# Patient Record
Sex: Female | Born: 1945
Health system: Southern US, Community
[De-identification: ages and names within clinical notes are randomized; demographics above are authoritative.]

## PROBLEM LIST (undated history)

## (undated) DIAGNOSIS — I10 Essential (primary) hypertension: Secondary | ICD-10-CM

## (undated) DIAGNOSIS — E119 Type 2 diabetes mellitus without complications: Secondary | ICD-10-CM

## (undated) DIAGNOSIS — N289 Disorder of kidney and ureter, unspecified: Secondary | ICD-10-CM

## (undated) DIAGNOSIS — D649 Anemia, unspecified: Secondary | ICD-10-CM

## (undated) DIAGNOSIS — N184 Chronic kidney disease, stage 4 (severe): Secondary | ICD-10-CM

## (undated) DIAGNOSIS — R7303 Prediabetes: Secondary | ICD-10-CM

## (undated) HISTORY — PX: WRIST SURGERY: SHX841

---

## 2005-11-19 ENCOUNTER — Emergency Department (HOSPITAL_COMMUNITY): Admission: EM | Admit: 2005-11-19 | Discharge: 2005-11-19 | Payer: Self-pay | Admitting: *Deleted

## 2007-02-16 ENCOUNTER — Emergency Department (HOSPITAL_COMMUNITY): Admission: EM | Admit: 2007-02-16 | Discharge: 2007-02-16 | Payer: Self-pay | Admitting: Emergency Medicine

## 2012-03-04 ENCOUNTER — Encounter (HOSPITAL_COMMUNITY): Payer: Self-pay

## 2012-03-04 ENCOUNTER — Emergency Department (INDEPENDENT_AMBULATORY_CARE_PROVIDER_SITE_OTHER)
Admission: EM | Admit: 2012-03-04 | Discharge: 2012-03-04 | Disposition: A | Discharge status: Home / Self Care | Payer: Self-pay | Source: Home / Self Care | Attending: Emergency Medicine | Admitting: Emergency Medicine

## 2012-03-04 DIAGNOSIS — R739 Hyperglycemia, unspecified: Secondary | ICD-10-CM

## 2012-03-04 DIAGNOSIS — R7309 Other abnormal glucose: Secondary | ICD-10-CM

## 2012-03-04 HISTORY — DX: Prediabetes: R73.03

## 2012-03-04 LAB — CBC
HCT: 41.7 % (ref 36.0–46.0)
Hemoglobin: 14.8 g/dL (ref 12.0–15.0)
MCH: 29 pg (ref 26.0–34.0)
MCHC: 35.5 g/dL (ref 30.0–36.0)
MCV: 81.6 fL (ref 78.0–100.0)
Platelets: 223 10*3/uL (ref 150–400)
RBC: 5.11 MIL/uL (ref 3.87–5.11)
RDW: 12.1 % (ref 11.5–15.5)
WBC: 3.2 10*3/uL — ABNORMAL LOW (ref 4.0–10.5)

## 2012-03-04 LAB — POCT I-STAT, CHEM 8
BUN: 15 mg/dL (ref 6–23)
Calcium, Ion: 1.21 mmol/L (ref 1.12–1.32)
Chloride: 98 mEq/L (ref 96–112)
Creatinine, Ser: 1 mg/dL (ref 0.50–1.10)
Glucose, Bld: 325 mg/dL — ABNORMAL HIGH (ref 70–99)
HCT: 46 % (ref 36.0–46.0)
Hemoglobin: 15.6 g/dL — ABNORMAL HIGH (ref 12.0–15.0)
Potassium: 4 mEq/L (ref 3.5–5.1)
Sodium: 131 mEq/L — ABNORMAL LOW (ref 135–145)
TCO2: 23 mmol/L (ref 0–100)

## 2012-03-04 LAB — DIFFERENTIAL
Basophils Absolute: 0 10*3/uL (ref 0.0–0.1)
Basophils Relative: 1 % (ref 0–1)
Eosinophils Absolute: 0 10*3/uL (ref 0.0–0.7)
Eosinophils Relative: 0 % (ref 0–5)
Lymphocytes Relative: 37 % (ref 12–46)
Lymphs Abs: 1.2 10*3/uL (ref 0.7–4.0)
Monocytes Absolute: 0.2 10*3/uL (ref 0.1–1.0)
Monocytes Relative: 5 % (ref 3–12)
Neutro Abs: 1.8 10*3/uL (ref 1.7–7.7)
Neutrophils Relative %: 57 % (ref 43–77)

## 2012-03-04 MED ORDER — ORALYTE PO SOLN: 2000.0000 L | Freq: Once | ORAL | Status: DC

## 2012-03-04 MED ORDER — METFORMIN HCL 500 MG PO TABS: 500.0000 mg | ORAL_TABLET | Freq: Two times a day (BID) | ORAL | Status: DC

## 2012-03-04 NOTE — ED Notes (Signed)
Pt states she has been sick since last Wed.  Reports sx started with head congestion, cough, fever,  and headache.  Over the weekend she experienced diarrhea which she has treated with the BRAT diet.  Continues to feel fatigued, weak, chest and throat hurts with coughing.  Denies vomiting, states drinking plenty of fluids.

## 2012-03-04 NOTE — ED Provider Notes (Signed)
Have examined and interviewed patient- with NP student. Symptomatic treatment and structured oral rehydration recommendations. Follow-up with PCP recommended as we discussed possibly current hyperglycemia needs more long-term management as she has been off oral antihyperglycemics medicines per provider guidance.Patient is stable and agreed to discussed this further with Dr.Bland.  Rosana Hoes, MD 03/04/12 [phone]

## 2012-03-04 NOTE — ED Provider Notes (Signed)
History     CSN: [id number]  Arrival date & time 03/04/12  1155   First MD Initiated Contact with Patient 03/04/12 1256      Chief Complaint  Patient presents with  . URI  . Diarrhea    (Consider location/radiation/quality/duration/timing/severity/associated sxs/prior treatment) Patient is a 66 y.o. female presenting with diarrhea. The history is provided by the patient.  Diarrhea The primary symptoms include fatigue, abdominal pain and diarrhea. Primary symptoms do not include nausea, vomiting, melena or hematochezia. Episode onset: 5-7 days ago. Onset quality: improved but returned this am.  The fatigue began 2 days ago. The fatigue has been unchanged since its onset.  The abdominal pain began more than 2 days ago. Progression: intermittent. The abdominal pain is generalized. The abdominal pain does not radiate (generalized abdominal tenderness ).  The illness is also significant for chills. The illness does not include constipation.    Past Medical History  Diagnosis Date  . Pre-diabetes     Past Surgical History  Procedure Date  . Wrist surgery     No family history on file.  History  Substance Use Topics  . Smoking status: Never Smoker   . Smokeless tobacco: Not on file  . Alcohol Use: No    OB History    Grav Para Term Preterm Abortions TAB SAB Ect Mult Living                  Review of Systems  Constitutional: Positive for chills and fatigue.       Reports tactile fever  Respiratory: Positive for cough. Negative for wheezing.        Reports hx of productive cough x 1,     Gastrointestinal: Positive for abdominal pain and diarrhea. Negative for nausea, vomiting, constipation, blood in stool, melena and hematochezia.       Loose stools less than 10/day,  Restarted this am, reports tried [employer] that improved symptoms for one day,   Musculoskeletal:       Reports hx of weakness to extremities     Allergies  Review of patient's allergies indicates  no known allergies.  Home Medications   Current Outpatient Rx  Name Route Sig Dispense Refill  . METFORMIN HCL 500 MG PO TABS Oral Take 1 tablet (500 mg total) by mouth 2 (two) times daily with a meal. 60 tablet 0  . ORALYTE PO SOLN Oral Take 2,000 L by mouth once. 4 Bottle 0    BP 116/77  Pulse 100  Temp(Src) 99 F (37.2 C) (Oral)  Resp 14  SpO2 96%  Physical Exam  Constitutional: She is oriented to person, place, and time. She appears well-developed and well-nourished. No distress.  Pulmonary/Chest: No respiratory distress. She has no wheezes. She has no rales.  Abdominal: She exhibits no distension and no mass. There is tenderness. There is no rebound and no guarding.  Lymphadenopathy:    She has no cervical adenopathy.  Neurological: She is alert and oriented to person, place, and time.  Skin: Skin is warm and dry.  Psychiatric: She has a normal mood and affect.    ED Course  Procedures (including critical care time)  Labs Reviewed  CBC - Abnormal; Notable for the following:    WBC 3.2 (*)    All other components within normal limits  POCT I-STAT, CHEM 8 - Abnormal; Notable for the following:    Sodium 131 (*)    Glucose, Bld 325 (*)    Hemoglobin 15.6 (*)  All other components within normal limits  DIFFERENTIAL   No results found.   1. Hyperglycemia       MDM  66yo female presents to [facility] for diarrhea less than 10/day over the past 2 days that improved with BRAT diet she had researched on the computer, diarrhea restarted yesterday, deep cough  And productive x 1 patient is diet controlled diabetic, has used Diabetic Tussin OTC and Vitamin C without relief,  Labs drawn BMP, CBC results reviewed, patient instructed to follow up with PCP concerning diabetic control , restarting Metformin, diet instructions given,checking blood glucose levels,  hydration protocol reviewed,patient verbalized understanding of tx plan       Theadore Nan,  Student-NP 03/04/12 [place], [place] 03/04/12 1605

## 2012-03-04 NOTE — Discharge Instructions (Signed)
As discussed within the next 12 hours try to ingest anywhere from 2-4 L of fluid and start with metformin. It is unlikely that your sugar is as elevated as it is triggered only by his current viral syndrome I would encourage you to continue metformin until you see your primary care Dr. to monitor your sugars more closely or 2 in fact modify your treatment regimen. He also had several minutes of discussion about dietary modifications that should be done within the next 2-3 days.  If abdominal pain, fevers or worsening symptoms despite treatment should go to the emergency department for further evaluation    Hyperglycemia Hyperglycemia occurs when the glucose (sugar) in your blood is too high. Hyperglycemia can happen for many reasons, but it most often happens to people who do not know they have diabetes or are not managing their diabetes properly.  CAUSES  Whether you have diabetes or not, there are other causes of hyperglycemia. Hyperglycemia can occur when you have diabetes, but it can also occur in other situations that you might not be as aware of, such as: Diabetes  If you have diabetes and are having problems controlling your blood glucose, hyperglycemia could occur because of some of the following reasons:   Not following your meal plan.   Not taking your diabetes medications or not taking it properly.   Exercising less or doing less activity than you normally do.   Being sick.  Pre-diabetes  This cannot be ignored. Before people develop Type 2 diabetes, they almost always have "pre-diabetes." This is when your blood glucose levels are higher than normal, but not yet high enough to be diagnosed as diabetes. Research has shown that some long-term damage to the body, especially the heart and circulatory system, may already be occurring during pre-diabetes. If you take action to manage your blood glucose when you have pre-diabetes, you may delay or prevent Type 2 diabetes from  developing.  Stress  If you have diabetes, you may be "diet" controlled or on oral medications or insulin to control your diabetes. However, you may find that your blood glucose is higher than usual in the hospital whether you have diabetes or not. This is often referred to as "stress hyperglycemia." Stress can elevate your blood glucose. This happens because of hormones put out by the body during times of stress. If stress has been the cause of your high blood glucose, it can be followed regularly by your caregiver. That way he/she can make sure your hyperglycemia does not continue to get worse or progress to diabetes.  Steroids  Steroids are medications that act on the infection fighting system (immune system) to block inflammation or infection. One side effect can be a rise in blood glucose. Most people can produce enough extra insulin to allow for this rise, but for those who cannot, steroids make blood glucose levels go even higher. It is not unusual for steroid treatments to "uncover" diabetes that is developing. It is not always possible to determine if the hyperglycemia will go away after the steroids are stopped. A special blood test called an A1c is sometimes done to determine if your blood glucose was elevated before the steroids were started.  SYMPTOMS  Thirsty.   Frequent urination.   Dry mouth.   Blurred vision.   Tired or fatigue.   Weakness.   Sleepy.   Tingling in feet or leg.  DIAGNOSIS  Diagnosis is made by monitoring blood glucose in one or all of the following  ways:  A1c test. This is a chemical found in your blood.   Fingerstick blood glucose monitoring.   Laboratory results.  TREATMENT  First, knowing the cause of the hyperglycemia is important before the hyperglycemia can be treated. Treatment may include, but is not be limited to:  Education.   Change or adjustment in medications.   Change or adjustment in meal plan.   Treatment for an illness,  infection, etc.   More frequent blood glucose monitoring.   Change in exercise plan.   Decreasing or stopping steroids.   Lifestyle changes.  HOME CARE INSTRUCTIONS   Test your blood glucose as directed.   Exercise regularly. Your caregiver will give you instructions about exercise. Pre-diabetes or diabetes which comes on with stress is helped by exercising.   Eat wholesome, balanced meals. Eat often and at regular, fixed times. Your caregiver or nutritionist will give you a meal plan to guide your sugar intake.   Being at an ideal weight is important. If needed, losing as little as 10 to 15 pounds may help improve blood glucose levels.  SEEK MEDICAL CARE IF:   You have questions about medicine, activity, or diet.   You continue to have symptoms (problems such as increased thirst, urination, or weight gain).  SEEK IMMEDIATE MEDICAL CARE IF:   You are vomiting or have diarrhea.   Your breath smells fruity.   You are breathing faster or slower.   You are very sleepy or incoherent.   You have numbness, tingling, or pain in your feet or hands.   You have chest pain.   Your symptoms get worse even though you have been following your caregiver's orders.   If you have any other questions or concerns.  Document Released: 05/09/2001 Document Revised: 11/02/2011 Document Reviewed: 07/05/2009 [facility] Patient Information 2012 [place].

## 2013-04-16 DIAGNOSIS — H25049 Posterior subcapsular polar age-related cataract, unspecified eye: Secondary | ICD-10-CM | POA: Diagnosis not present

## 2013-04-16 DIAGNOSIS — H2510 Age-related nuclear cataract, unspecified eye: Secondary | ICD-10-CM | POA: Diagnosis not present

## 2013-04-16 DIAGNOSIS — H25019 Cortical age-related cataract, unspecified eye: Secondary | ICD-10-CM | POA: Diagnosis not present

## 2013-05-07 DIAGNOSIS — H2510 Age-related nuclear cataract, unspecified eye: Secondary | ICD-10-CM | POA: Diagnosis not present

## 2014-06-17 DIAGNOSIS — L2089 Other atopic dermatitis: Secondary | ICD-10-CM | POA: Diagnosis not present

## 2014-06-26 DIAGNOSIS — E119 Type 2 diabetes mellitus without complications: Secondary | ICD-10-CM | POA: Diagnosis not present

## 2014-08-07 DIAGNOSIS — E119 Type 2 diabetes mellitus without complications: Secondary | ICD-10-CM | POA: Diagnosis not present

## 2014-10-30 DIAGNOSIS — E1169 Type 2 diabetes mellitus with other specified complication: Secondary | ICD-10-CM | POA: Diagnosis not present

## 2014-11-02 DIAGNOSIS — E782 Mixed hyperlipidemia: Secondary | ICD-10-CM | POA: Diagnosis not present

## 2014-11-02 DIAGNOSIS — L2089 Other atopic dermatitis: Secondary | ICD-10-CM | POA: Diagnosis not present

## 2014-11-02 DIAGNOSIS — Z23 Encounter for immunization: Secondary | ICD-10-CM | POA: Diagnosis not present

## 2014-11-02 DIAGNOSIS — E0800 Diabetes mellitus due to underlying condition with hyperosmolarity without nonketotic hyperglycemic-hyperosmolar coma (NKHHC): Secondary | ICD-10-CM | POA: Diagnosis not present

## 2015-09-09 ENCOUNTER — Emergency Department (HOSPITAL_COMMUNITY)
Admission: EM | Admit: 2015-09-09 | Discharge: 2015-09-09 | Disposition: A | Discharge status: Home / Self Care | Payer: Medicare (Managed Care) | Attending: Emergency Medicine | Admitting: Emergency Medicine

## 2015-09-09 ENCOUNTER — Emergency Department (HOSPITAL_COMMUNITY): Payer: Medicare (Managed Care)

## 2015-09-09 ENCOUNTER — Encounter (HOSPITAL_COMMUNITY): Payer: Self-pay | Admitting: Emergency Medicine

## 2015-09-09 DIAGNOSIS — E119 Type 2 diabetes mellitus without complications: Secondary | ICD-10-CM | POA: Diagnosis not present

## 2015-09-09 DIAGNOSIS — Z79899 Other long term (current) drug therapy: Secondary | ICD-10-CM | POA: Diagnosis not present

## 2015-09-09 DIAGNOSIS — R0602 Shortness of breath: Secondary | ICD-10-CM

## 2015-09-09 HISTORY — DX: Type 2 diabetes mellitus without complications: E11.9

## 2015-09-09 LAB — BASIC METABOLIC PANEL
Anion gap: 5 (ref 5–15)
BUN: 12 mg/dL (ref 6–20)
CO2: 27 mmol/L (ref 22–32)
Calcium: 9.6 mg/dL (ref 8.9–10.3)
Chloride: 107 mmol/L (ref 101–111)
Creatinine, Ser: 1.13 mg/dL — ABNORMAL HIGH (ref 0.44–1.00)
GFR calc Af Amer: 57 mL/min — ABNORMAL LOW (ref >60)
GFR calc non Af Amer: 49 mL/min — ABNORMAL LOW (ref >60)
Glucose, Bld: 116 mg/dL — ABNORMAL HIGH (ref 65–99)
Potassium: 4.1 mmol/L (ref 3.5–5.1)
Sodium: 139 mmol/L (ref 135–145)

## 2015-09-09 LAB — CBC WITH DIFFERENTIAL/PLATELET
Basophils Absolute: 0 10*3/uL (ref 0.0–0.1)
Basophils Relative: 1 %
Eosinophils Absolute: 0.1 10*3/uL (ref 0.0–0.7)
Eosinophils Relative: 3 %
HCT: 39.9 % (ref 36.0–46.0)
Hemoglobin: 13.5 g/dL (ref 12.0–15.0)
Lymphocytes Relative: 42 %
Lymphs Abs: 1.6 10*3/uL (ref 0.7–4.0)
MCH: 29.2 pg (ref 26.0–34.0)
MCHC: 33.8 g/dL (ref 30.0–36.0)
MCV: 86.2 fL (ref 78.0–100.0)
Monocytes Absolute: 0.2 10*3/uL (ref 0.1–1.0)
Monocytes Relative: 5 %
Neutro Abs: 1.9 10*3/uL (ref 1.7–7.7)
Neutrophils Relative %: 49 %
Platelets: 253 10*3/uL (ref 150–400)
RBC: 4.63 MIL/uL (ref 3.87–5.11)
RDW: 12.8 % (ref 11.5–15.5)
WBC: 3.8 10*3/uL — ABNORMAL LOW (ref 4.0–10.5)

## 2015-09-09 LAB — I-STAT TROPONIN, ED: Troponin i, poc: 0 ng/mL (ref 0.00–0.08)

## 2015-09-09 NOTE — Discharge Instructions (Signed)
Work-up today was normal. Follow-up with [name]-- call tomorrow to make appt. Return to the ED for new or worsening symptoms.

## 2015-09-09 NOTE — ED Provider Notes (Signed)
CSN: [id number]     Arrival date & time 09/09/15  1051 History   First MD Initiated Contact with Patient 09/09/15 1120     Chief Complaint  Patient presents with  . Panic Attack  . Shortness of Breath     (Consider location/radiation/quality/duration/timing/severity/associated sxs/prior Treatment) Patient is a 69 y.o. female presenting with shortness of breath. The history is provided by the patient and medical records.  Shortness of Breath   69 year old female with history of diabetes, presenting to the ED for episodic shortness of breath and lightheadedness. Patient states she woke to work this morning and while walking from her car to her office she developed shortness of breath, labored breathing, and lightheadedness. States she did make it to her office and sat down in the chair with improvement of symptoms.   States she continued to feel "weird" and called her friend into her office who then called EMS. Patient denies any chest pain.  No dizziness, focal numbness/weakness, confusion, changes in speech, visual disturbance, tinnitus, headache, or ataxia.  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  She states she feels that she has too much on her plate and that is what caused her symptoms today.  Patient states she was told she had benign MVP several years ago, no other cardiac issues.  Patient has never been a smoker.  No hx of TIA or stroke.  VSS.  Past Medical History  Diagnosis Date  . Pre-diabetes   . Diabetes mellitus without complication [facility])    Past Surgical History  Procedure Laterality Date  . Wrist surgery     History reviewed. No pertinent family history. Social History  Substance Use Topics  . Smoking status: Never Smoker   . Smokeless tobacco: None  . Alcohol Use: No   OB History    No data  available     Review of Systems  Respiratory: Positive for shortness of breath.   All other systems reviewed and are negative.     Allergies  Review of patient's allergies indicates no known allergies.  Home Medications   Prior to Admission medications   Medication Sig Start Date End Date Taking? Authorizing Provider  metFORMIN (GLUCOPHAGE) 500 MG tablet Take 1 tablet (500 mg total) by mouth 2 (two) times daily with a meal. 03/04/12 03/04/13  Rosana Hoes, MD  Oral Electrolytes (ORALYTE) SOLN Take 2,000 L by mouth once. 03/04/12   Rosana Hoes, MD   BP 131/84 mmHg  Pulse 85  Temp(Src) 98.1 F (36.7 C)  Resp 20  SpO2 98%   Physical Exam  Constitutional: She is oriented to person, place, and time. She appears well-developed and well-nourished. No distress.  HENT:  Head: Normocephalic and atraumatic.  Mouth/Throat: Oropharynx is clear and moist.  Eyes: Conjunctivae and EOM are normal. Pupils are equal, round, and reactive to light.  Neck: Normal range of motion. Neck supple.  Cardiovascular: Normal rate, regular rhythm and normal heart sounds.   Pulmonary/Chest: Effort normal and breath sounds normal. No respiratory distress. She has no wheezes.  Abdominal: Soft. Bowel sounds are normal.  Musculoskeletal: Normal range of motion. She exhibits no edema.  Neurological: She is alert and oriented to person, place, and time.  AAOx3, answering questions appropriately; equal strength UE and LE bilaterally; CN grossly intact; moves all extremities appropriately without ataxia; no focal neuro  deficits or facial asymmetry appreciated  Skin: Skin is warm and dry. She is not diaphoretic.  Psychiatric: She has a normal mood and affect.  Nursing note and vitals reviewed.   ED Course  Procedures (including critical care time) Labs Review Labs Reviewed  CBC WITH DIFFERENTIAL/PLATELET - Abnormal; Notable for the following:    WBC 3.8 (*)    All other components within normal limits  BASIC  METABOLIC PANEL - Abnormal; Notable for the following:    Glucose, Bld 116 (*)    Creatinine, Ser 1.13 (*)    GFR calc non Af Amer 49 (*)    GFR calc Af Amer 57 (*)    All other components within normal limits  Randolm Idol, ED    Imaging Review Dg Chest 2 View  09/09/2015  CLINICAL DATA:  Shortness of breath.  Diabetes. EXAM: CHEST  2 VIEW COMPARISON:  None. FINDINGS: The heart size and mediastinal contours are within normal limits. Both lungs are clear. The visualized skeletal structures are unremarkable. IMPRESSION: No active cardiopulmonary disease. Electronically Signed   By: Van Clines M.D.   On: 09/09/2015 12:56   I have personally reviewed and evaluated these images and lab results as part of my medical decision-making.   EKG Interpretation None      MDM   Final diagnoses:  Shortness of breath   69 year old female here for episodic shortness of breath this morning. Patient notes marked stress in her work, church, and home environment recently. Patient is afebrile, nontoxic. She has no active chest pain or shortness of breath currently. Vital signs are stable. She has no cardiac history. Neurologically intact. Workup here is unremarkable, no evidence of cardiac or pulmonary insult. Vital signs remained stable. Given negative workup and brief symptoms, low suspicion for ACS, PE, dissection, or other acute cardiac event at this time. No significant risk factors for stroke, patient remains neurologically intact-- low clinical suspicion for TIA/CVA.  Symptoms may be due to acute stress reaction.  Discussed relaxation at home, reducing stress, etc.  Patient to FU with her PCP.  Discussed plan with patient, he/she acknowledged understanding and agreed with plan of care.  Return precautions given for new or worsening symptoms.  Case discussed with attending physician, Dr. Doy Mince, who evaluated patient and agrees with assessment and plan of care.  Larene Pickett,  PA-C 09/09/15 [place], MD 09/10/15 2100

## 2015-09-09 NOTE — ED Notes (Signed)
Pt reports multiple stressors with work, church, feeling overwhelmed. Pt states today while walking into work her breathing was rapid and irregular. Pt now calm, states symptoms have started to subside. Lungs CTa.

## 2015-09-09 NOTE — ED Notes (Signed)
Entered pt's room to d/c, introduced myself and explained what I was doing. Pt was unhappy that she had to have additional vital signs taken as the "doctor told me I was discharged." This RN explained that was what I was in the room for.  Pt's visitor told pt to "calm down."  Asked the pt if she would have a seat on the bed to get a last set of vital signs.  Before pt sat, this RN asked the pt if was in any pain.  The pt responded in an angry voice "I've never been in any pain.  Don't you all talk with each other.  I'm tired of you asking the same questions."  This RN tried to tell the pt we ask about pain regularly throughout their stay.  Pt yelled at this RN stating "Do not snap at me."  I replied that I was not snapping, tried to reply to the last comment again and was yelled at in a louder voice being told "Do not snap at me."  Pt looked at her visitor snapping her hand and pointing telling her to "get me someone else."  This RN stated "I would be happy to get another nurse."  Requested Ellison Hughs RN to d/c pt.

## 2015-09-09 NOTE — ED Notes (Signed)
Patient transported to X-ray 

## 2015-09-09 NOTE — ED Notes (Signed)
Pt on the cell phone talking up a storm. Unable to perform triage.

## 2016-11-17 IMAGING — DX DG CHEST 2V
2 series · 2 of 2 positions shown · non-contrast
Comparison: None.

CLINICAL DATA: Shortness of breath.  Diabetes.

EXAM:
CHEST  2 VIEW

[chest pa]
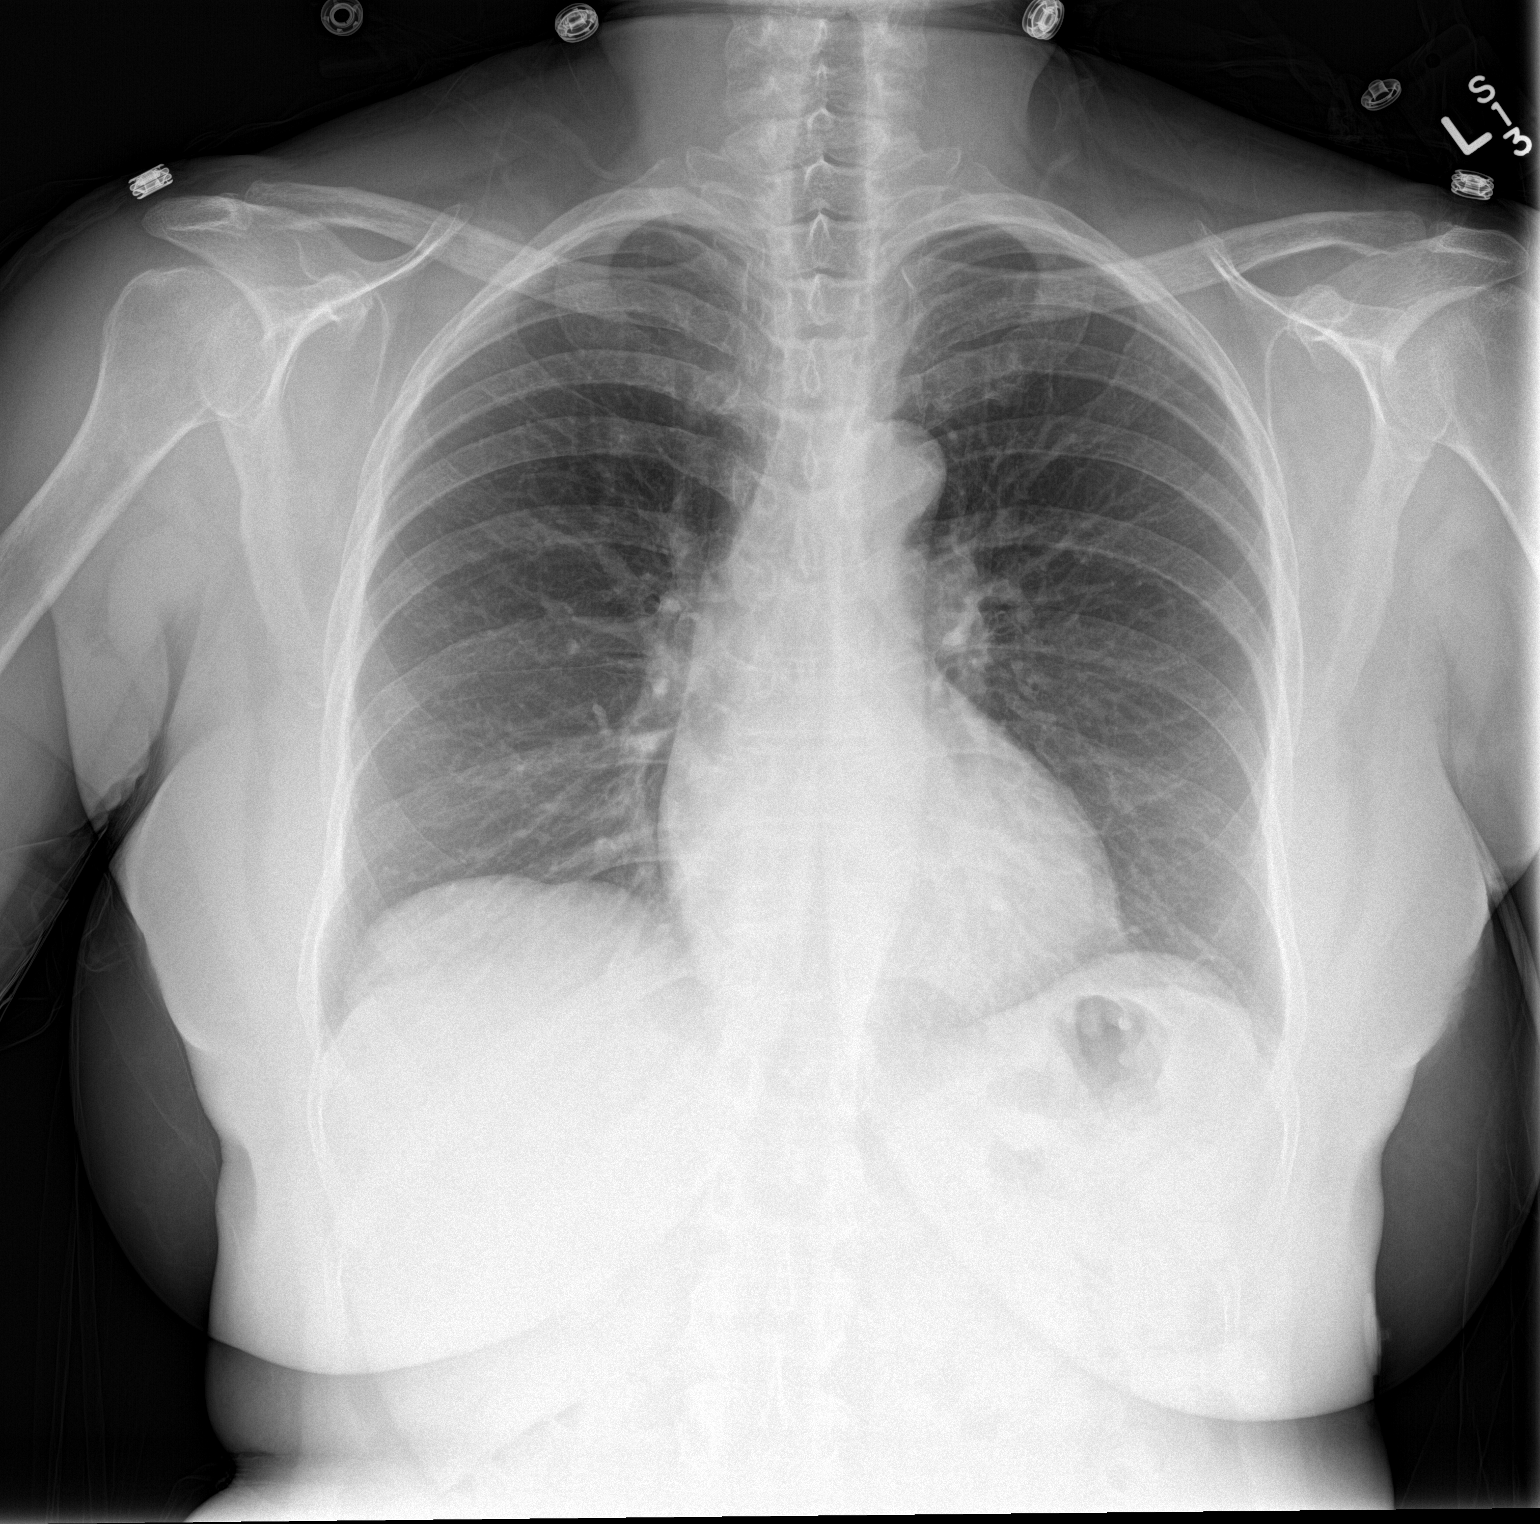

[chest lat]
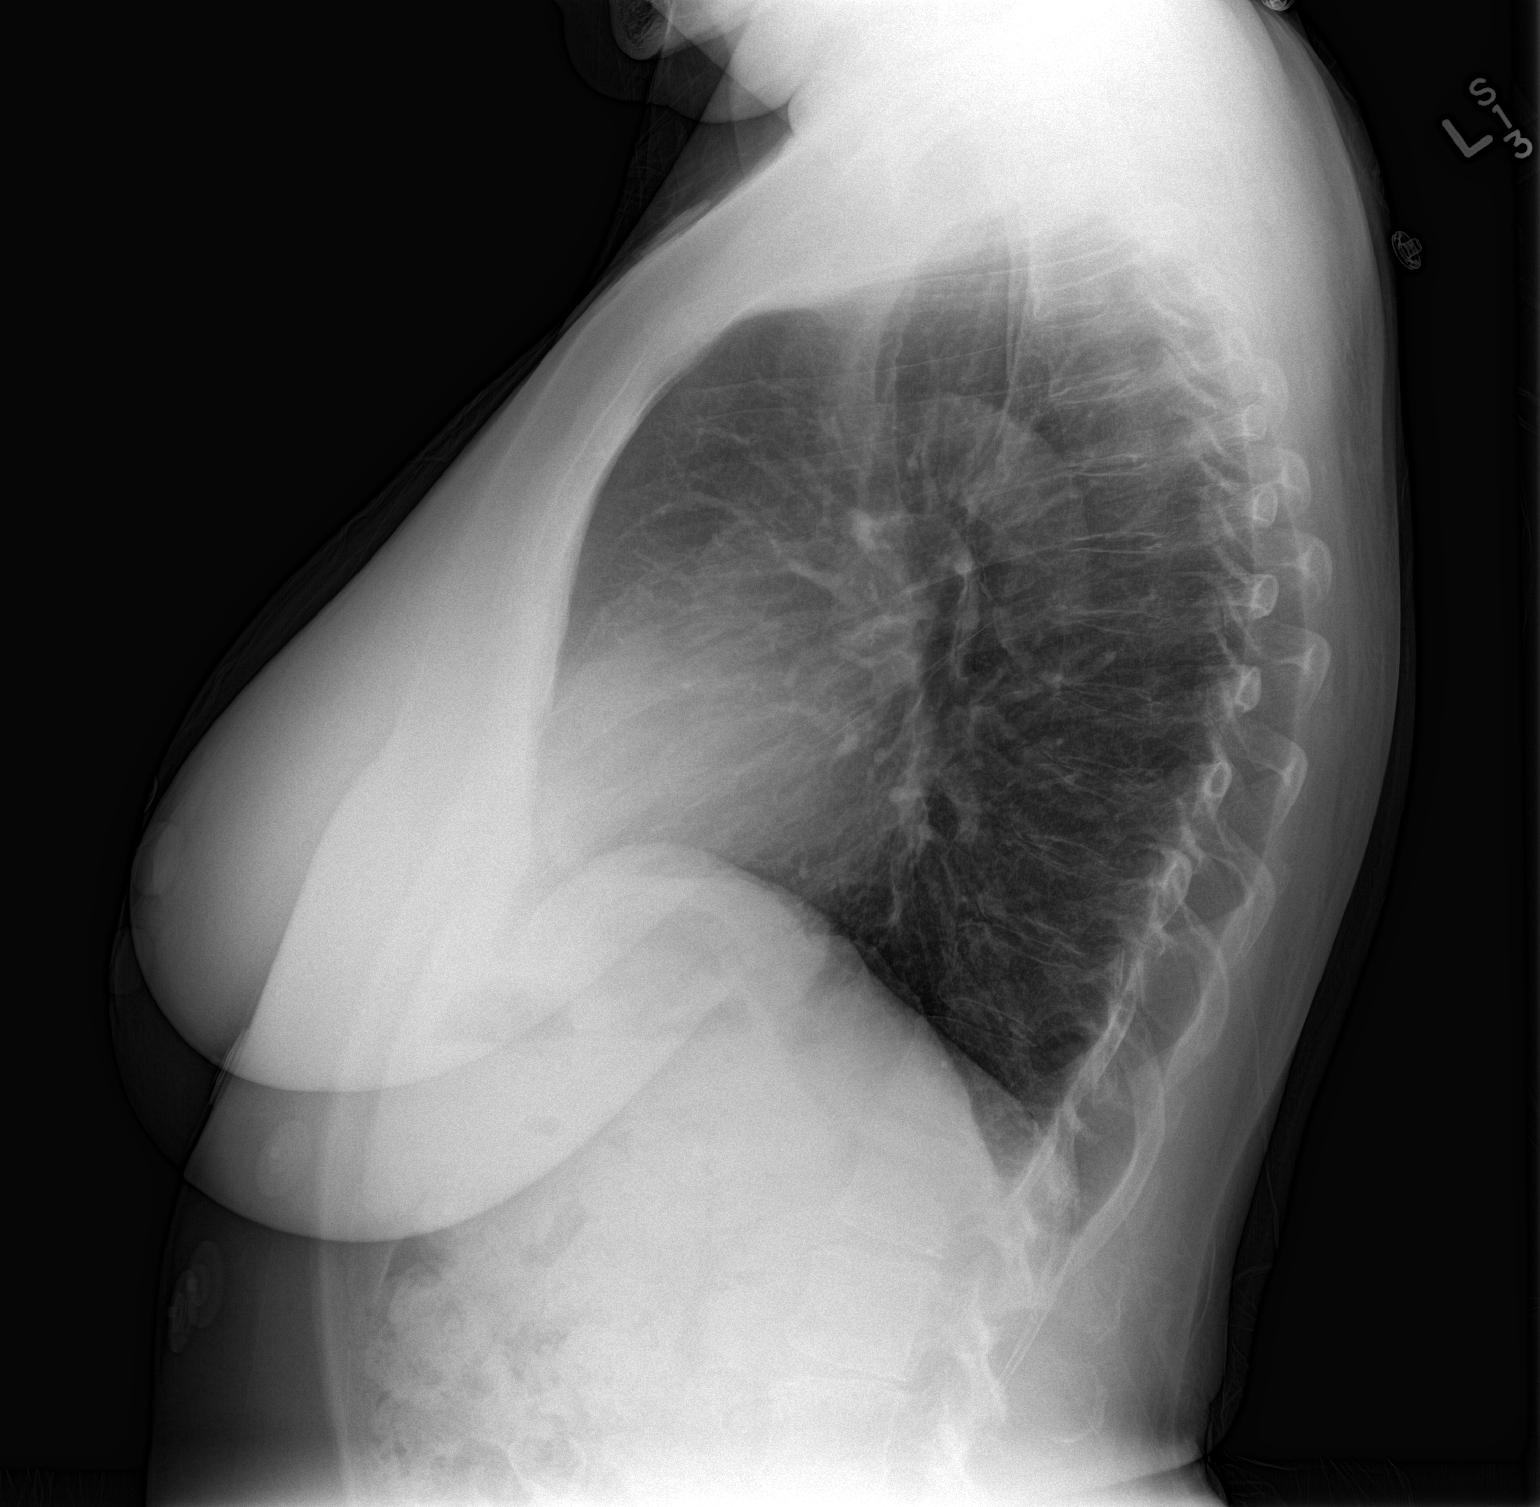

[2 of 2 positions shown; findings below may reference images not displayed]

FINDINGS: The heart size and mediastinal contours are within normal limits.
Both lungs are clear. The visualized skeletal structures are
unremarkable.
IMPRESSION: No active cardiopulmonary disease.

## 2018-06-19 ENCOUNTER — Ambulatory Visit (INDEPENDENT_AMBULATORY_CARE_PROVIDER_SITE_OTHER): Payer: Medicare Other

## 2018-06-19 ENCOUNTER — Ambulatory Visit (INDEPENDENT_AMBULATORY_CARE_PROVIDER_SITE_OTHER): Payer: Medicare Other | Admitting: Podiatry

## 2018-06-19 ENCOUNTER — Other Ambulatory Visit: Payer: Self-pay | Admitting: Podiatry

## 2018-06-19 ENCOUNTER — Encounter: Payer: Self-pay | Admitting: Podiatry

## 2018-06-19 VITALS — BP 142/78 | HR 80

## 2018-06-19 DIAGNOSIS — M779 Enthesopathy, unspecified: Secondary | ICD-10-CM

## 2018-06-19 DIAGNOSIS — M79672 Pain in left foot: Secondary | ICD-10-CM

## 2018-06-19 MED ORDER — TRIAMCINOLONE ACETONIDE 10 MG/ML IJ SUSP
10.0000 mg | Freq: Once | INTRAMUSCULAR | Status: AC
  Administered 2018-06-19: 10 mg

## 2018-06-20 NOTE — Progress Notes (Signed)
Subjective:   Patient ID: Jackie Wade, female   DOB: 72 y.o.   MRN: [id number]   HPI Patient presents stating that she has knots on the dorsum of the left first metatarsal and a painful keratotic lesion plantar aspect left first metatarsal is making it hard to walk on.  Patient states is been there for at least a year or maybe 2 years but recently it is more tender and it feels like she is walking on a pebble.  Patient does not smoke and likes to be active   Review of Systems  All other systems reviewed and are negative.       Objective:  Physical Exam  Constitutional: She appears well-developed and well-nourished.  Cardiovascular: Intact distal pulses.  Pulmonary/Chest: Effort normal.  Musculoskeletal: Normal range of motion.  Neurological: She is alert.  Skin: Skin is warm.  Nursing note and vitals reviewed.   Neurovascular status found to be intact muscle strength is adequate range of motion within normal limits with patient noted to have inflammation plantar aspect left first metatarsal with fluid buildup around the metatarsal phalangeal joint keratotic dorsal tissue noted that is thickened.  Patient has nodules around the first metatarsal left over right with moderate structural bunion deformity and was found to have good digital perfusion and is well oriented x3     Assessment:  Appears to be inflammatory plantar capsulitis left with keratotic dorsal lesion with nodules around the first metatarsal left that are nonpainful with moderate structural bunion deformity     Plan:  H&P x-ray reviewed with patient.  At this point I did do a careful injection of the plantar capsule left 3 mg Kenalog 5 mg Xylocaine and I debrided the lesion there was slight dorsal bleeding I applied sterile dressing.  If this does not get better we will get a need to consider surgical intervention I did discuss ultimately this may require a bunion correction  X-ray indicates moderate structural bunion  deformity with also moderate osteoporosis and there may be some plantar calcification noted on lateral view

## 2019-09-01 ENCOUNTER — Encounter: Payer: Self-pay | Admitting: Podiatry

## 2019-09-01 NOTE — Progress Notes (Signed)
Patients medical records were placed up front to be mailed to patient per her request.

## 2019-09-23 ENCOUNTER — Other Ambulatory Visit: Payer: Self-pay | Admitting: Sports Medicine

## 2019-09-23 ENCOUNTER — Ambulatory Visit (INDEPENDENT_AMBULATORY_CARE_PROVIDER_SITE_OTHER): Payer: Medicare Other

## 2019-09-23 ENCOUNTER — Encounter: Payer: Self-pay | Admitting: Sports Medicine

## 2019-09-23 ENCOUNTER — Ambulatory Visit (INDEPENDENT_AMBULATORY_CARE_PROVIDER_SITE_OTHER): Payer: Medicare Other | Admitting: Sports Medicine

## 2019-09-23 ENCOUNTER — Other Ambulatory Visit: Payer: Self-pay

## 2019-09-23 DIAGNOSIS — M21619 Bunion of unspecified foot: Secondary | ICD-10-CM

## 2019-09-23 DIAGNOSIS — M779 Enthesopathy, unspecified: Secondary | ICD-10-CM | POA: Diagnosis not present

## 2019-09-23 DIAGNOSIS — M109 Gout, unspecified: Secondary | ICD-10-CM

## 2019-09-23 DIAGNOSIS — R29898 Other symptoms and signs involving the musculoskeletal system: Secondary | ICD-10-CM

## 2019-09-23 DIAGNOSIS — M79672 Pain in left foot: Secondary | ICD-10-CM

## 2019-09-23 NOTE — Progress Notes (Signed)
Subjective: Jackie Wade is a 73 y.o. female patient who presents to office for evaluation of left foot pain at lesion plantar big toe joint.  Patient reports that this has been an ongoing problem but slowly getting worse as far as the buildup has to trim the lesion every 8 to 10 days patient reports that she was here last visit and was not told anything about what was going on with her foot and is becoming concerned because the lesion is getting hard and very painful at times.  Patient reports that she is limited can only wear flat shoes or tennis shoes has difficulty wearing high heels reports that when she does wear shoes with a heel her foot turned and and her ankle hurt.  Patient denies any increase in redness warmth swelling drainage.  Patient admits to a history of diabetes and also admits to a history of rheumatoid with her mom and arthritis with her brother.  Patient reports that her diabetes is diet controlled.  Patient denies any other pedal complaints.   There are no active problems to display for this patient.   Current Outpatient Medications on File Prior to Visit  Medication Sig Dispense Refill  . metFORMIN (GLUCOPHAGE) 500 MG tablet Take 1 tablet (500 mg total) by mouth 2 (two) times daily with a meal. 60 tablet 0   No current facility-administered medications on file prior to visit.     No Known Allergies  Objective:  General: Alert and oriented x3 in no acute distress  Dermatology: Callus sub met 1 on left, No open lesions bilateral lower extremities, no webspace macerations, no ecchymosis bilateral, all nails x 10 are well manicured.  Vascular: Dorsalis Pedis and Posterior Tibial pedal pulses 2/4, Capillary Fill Time 3 seconds, (+) pedal hair growth bilateral, no edema bilateral lower extremities, Temperature gradient within normal limits.  Neurology: Johney Maine sensation intact via light touch bilateral.   Musculoskeletal: Mild tenderness with palpation left bunion  deformity, +pain to keratosis sub met 1 on left. No reproducible ankle pain but subjective instability. Pes planus.   Left  Impression: Intermetatarsal angle above normal limits supportive of bunion with increased calfication and arthritis at sesamoid complex with bony protrusion.       Assessment and Plan: Problem List Items Addressed This Visit    None    Visit Diagnoses    Left foot pain    -  Primary   Gout of foot, unspecified cause, unspecified chronicity, unspecified laterality       Relevant Orders   ANA, IFA Comprehensive Panel   C-reactive protein   Rheumatoid factor   Sedimentation rate   Uric acid   CBC with Differential   Basic Metabolic Panel   Bunion       Ankle weakness           -Complete examination performed -Xrays reviewed -Discussed treatement options; discussed HAV deformity with arthritis and sesmoid with boney protrusion -Mechanically debrided keratosis sub met 1 on right and applied salinocaine -Dispensed padding  -Recommend continue with good supportive shoes and will monitor ankle -Arthritic panel ordered  -Patient to return to office after blood work or sooner if condition worsens.  Landis Martins, DPM

## 2019-12-17 ENCOUNTER — Ambulatory Visit: Payer: Medicare Other | Attending: Internal Medicine

## 2019-12-17 ENCOUNTER — Other Ambulatory Visit: Payer: Self-pay

## 2019-12-17 DIAGNOSIS — Z23 Encounter for immunization: Secondary | ICD-10-CM | POA: Insufficient documentation

## 2019-12-17 NOTE — Progress Notes (Signed)
   Covid-19 Vaccination Clinic  Name:  Jackie Wade    MRN: [id number] DOB: 1945-12-17  12/17/2019  Jackie Wade was observed post Covid-19 immunization for 15 minutes without incidence. She was provided with Vaccine Information Sheet and instruction to access the V-Safe system.   Jackie Wade was instructed to call 911 with any severe reactions post vaccine: Marland Kitchen Difficulty breathing  . Swelling of your face and throat  . A fast heartbeat  . A bad rash all over your body  . Dizziness and weakness    Immunizations Administered    Name Date Dose VIS Date Route   Pfizer COVID-19 Vaccine 12/17/2019  5:24 PM 0.3 mL 11/07/2019 Intramuscular   Manufacturer: [place]   Lot: [id number]   [place]: [id number]

## 2020-01-07 ENCOUNTER — Ambulatory Visit: Payer: Medicare Other | Attending: Internal Medicine

## 2020-01-07 DIAGNOSIS — Z23 Encounter for immunization: Secondary | ICD-10-CM | POA: Insufficient documentation

## 2020-01-07 NOTE — Progress Notes (Signed)
   Covid-19 Vaccination Clinic  Name:  Tryphena Perkovich    MRN: [id number] DOB: June 30, 1946  01/07/2020  Ms. Rossie was observed post Covid-19 immunization for 15 minutes without incidence. She was provided with Vaccine Information Sheet and instruction to access the V-Safe system.   Ms. Hoopes was instructed to call 911 with any severe reactions post vaccine: Marland Kitchen Difficulty breathing  . Swelling of your face and throat  . A fast heartbeat  . A bad rash all over your body  . Dizziness and weakness    Immunizations Administered    Name Date Dose VIS Date Route   Pfizer COVID-19 Vaccine 01/07/2020 11:22 AM 0.3 mL 11/07/2019 Intramuscular   Manufacturer: [place]   Lot: [id number]   [place]: [address]-9

## 2020-04-07 DIAGNOSIS — E1165 Type 2 diabetes mellitus with hyperglycemia: Secondary | ICD-10-CM | POA: Diagnosis not present

## 2020-04-07 DIAGNOSIS — E559 Vitamin D deficiency, unspecified: Secondary | ICD-10-CM | POA: Diagnosis not present

## 2020-04-07 DIAGNOSIS — R600 Localized edema: Secondary | ICD-10-CM | POA: Diagnosis not present

## 2020-04-07 DIAGNOSIS — I10 Essential (primary) hypertension: Secondary | ICD-10-CM | POA: Diagnosis not present

## 2020-04-07 DIAGNOSIS — E782 Mixed hyperlipidemia: Secondary | ICD-10-CM | POA: Diagnosis not present

## 2020-04-19 DIAGNOSIS — Z03818 Encounter for observation for suspected exposure to other biological agents ruled out: Secondary | ICD-10-CM | POA: Diagnosis not present

## 2020-05-05 DIAGNOSIS — I10 Essential (primary) hypertension: Secondary | ICD-10-CM | POA: Diagnosis not present

## 2020-05-05 DIAGNOSIS — Z1211 Encounter for screening for malignant neoplasm of colon: Secondary | ICD-10-CM | POA: Diagnosis not present

## 2020-05-05 DIAGNOSIS — E1165 Type 2 diabetes mellitus with hyperglycemia: Secondary | ICD-10-CM | POA: Diagnosis not present

## 2020-05-20 ENCOUNTER — Ambulatory Visit: Payer: Self-pay | Admitting: Cardiology

## 2020-05-25 NOTE — Progress Notes (Deleted)
    Patient referred by Willey Blade, MD for hyperlipidemia  Subjective:   Jackie Wade, female    DOB: 03-Nov-1946, 74 y.o.   MRN: [id number]  *** No chief complaint on file.   *** HPI  74 y.o. African American female with uncontrolled type 2 DM (A1C>15.5%), hyperlipidemia, ***  *** Past Medical History:  Diagnosis Date  . Diabetes mellitus without complication ([place])   . Pre-diabetes     *** Past Surgical History:  Procedure Laterality Date  . WRIST SURGERY      *** Social History   Tobacco Use  Smoking Status Never Smoker  Smokeless Tobacco Never Used    Social History   Substance and Sexual Activity  Alcohol Use No    *** No family history on file.  *** Current Outpatient Medications on File Prior to Visit  Medication Sig Dispense Refill  . metFORMIN (GLUCOPHAGE) 500 MG tablet Take 1 tablet (500 mg total) by mouth 2 (two) times daily with a meal. 60 tablet 0   No current facility-administered medications on file prior to visit.    Cardiovascular and other pertinent studies:  EKG 04/07/2020: Sinus rhythm 88 bpm Left Axis Deviation  Recent labs: 04/10/2020: Glucose 398, BUN/Cr 21/1.67. EGFR 35. Na/K 131/3.9. A/G Ratio: 1.1, Alkaline Phosphatase: 121 H/H 11.2/32.8. MCV 83. Platelets 321 HbA1C >15.5% Chol 299, TG 165, HDL 96, LDL 174 ***TSH 1.600 normal   *** ROS      *** There were no vitals filed for this visit.  *** There is no height or weight on file to calculate BMI. There were no vitals filed for this visit.  *** Objective:   Physical Exam    ***     Assessment & Recommendations:   74 y.o. African American female with uncontrolled type 2 DM (A1C>15.5%), hyperlipidemia, ***  ***     Thank you for referring the patient to [place]. Please feel free to contact with any questions.  Nigel Mormon, MD [facility] Pager: [phone] Office: [phone]

## 2020-05-26 ENCOUNTER — Encounter: Payer: Self-pay | Admitting: Cardiology

## 2020-05-26 ENCOUNTER — Ambulatory Visit: Payer: Self-pay | Admitting: Cardiology

## 2020-06-14 ENCOUNTER — Ambulatory Visit: Payer: Medicare Other | Admitting: Cardiology

## 2020-06-14 NOTE — Progress Notes (Deleted)
    Patient referred by Willey Blade, MD for hyperlipidemia  Subjective:   Jackie Wade, female    DOB: 01-19-1946, 74 y.o.   MRN: [id number]  *** No chief complaint on file.   *** HPI  74 y.o. African American female with uncontrolled type 2 DM (A1C>15.5%), hyperlipidemia, ***  *** Past Medical History:  Diagnosis Date  . Diabetes mellitus without complication (HCC)     *** Past Surgical History:  Procedure Laterality Date  . WRIST SURGERY      *** Social History   Tobacco Use  Smoking Status Never Smoker  Smokeless Tobacco Never Used    Social History   Substance and Sexual Activity  Alcohol Use No    *** No family history on file.  *** Current Outpatient Medications on File Prior to Visit  Medication Sig Dispense Refill  . metFORMIN (GLUCOPHAGE) 500 MG tablet Take 1 tablet (500 mg total) by mouth 2 (two) times daily with a meal. 60 tablet 0   No current facility-administered medications on file prior to visit.    Cardiovascular and other pertinent studies:  EKG 04/07/2020: Sinus rhythm 88 bpm Left Axis Deviation  Recent labs: 04/10/2020: Glucose 398, BUN/Cr 21/1.67. EGFR 35. Na/K 131/3.9. A/G Ratio: 1.1, Alkaline Phosphatase: 121 H/H 11.2/32.8. MCV 83. Platelets 321 HbA1C >15.5% Chol 299, TG 165, HDL 96, LDL 174 ***TSH 1.600 normal   *** ROS      *** There were no vitals filed for this visit.  *** There is no height or weight on file to calculate BMI. There were no vitals filed for this visit.  *** Objective:   Physical Exam    ***     Assessment & Recommendations:   74 y.o. African American female with uncontrolled type 2 DM (A1C>15.5%), hyperlipidemia, ***  ***     Thank you for referring the patient to [place]. Please feel free to contact with any questions.  Nigel Mormon, MD [facility] Pager: [phone] Office: [phone]

## 2020-07-22 DIAGNOSIS — E1165 Type 2 diabetes mellitus with hyperglycemia: Secondary | ICD-10-CM | POA: Diagnosis not present

## 2020-07-22 DIAGNOSIS — R5383 Other fatigue: Secondary | ICD-10-CM | POA: Diagnosis not present

## 2020-07-22 DIAGNOSIS — I10 Essential (primary) hypertension: Secondary | ICD-10-CM | POA: Diagnosis not present

## 2020-07-27 ENCOUNTER — Ambulatory Visit: Payer: Medicare Other | Attending: Internal Medicine

## 2020-07-27 DIAGNOSIS — Z23 Encounter for immunization: Secondary | ICD-10-CM

## 2020-07-27 NOTE — Progress Notes (Signed)
   Covid-19 Vaccination Clinic  Name:  Jackie Wade    MRN: [id number] DOB: 1946-04-02  07/27/2020  [name] was observed post Covid-19 immunization for 15 minutes without incident. She was provided with Vaccine Information Sheet and instruction to access the V-Safe system.   [name] was instructed to call 911 with any severe reactions post vaccine: Marland Kitchen Difficulty breathing  . Swelling of face and throat  . A fast heartbeat  . A bad rash all over body  . Dizziness and weakness

## 2020-08-30 ENCOUNTER — Telehealth: Payer: Self-pay | Admitting: Cardiology

## 2020-08-30 ENCOUNTER — Ambulatory Visit (INDEPENDENT_AMBULATORY_CARE_PROVIDER_SITE_OTHER): Payer: Medicare Other | Admitting: Cardiology

## 2020-08-30 ENCOUNTER — Other Ambulatory Visit: Payer: Self-pay

## 2020-08-30 ENCOUNTER — Encounter: Payer: Self-pay | Admitting: Cardiology

## 2020-08-30 VITALS — BP 164/90 | HR 88 | Ht 63.0 in | Wt 136.2 lb

## 2020-08-30 DIAGNOSIS — E119 Type 2 diabetes mellitus without complications: Secondary | ICD-10-CM | POA: Diagnosis not present

## 2020-08-30 DIAGNOSIS — Z7189 Other specified counseling: Secondary | ICD-10-CM | POA: Diagnosis not present

## 2020-08-30 DIAGNOSIS — E785 Hyperlipidemia, unspecified: Secondary | ICD-10-CM | POA: Diagnosis not present

## 2020-08-30 DIAGNOSIS — I10 Essential (primary) hypertension: Secondary | ICD-10-CM

## 2020-08-30 MED ORDER — ROSUVASTATIN CALCIUM 5 MG PO TABS: ORAL_TABLET | ORAL | 0 refills | Status: DC

## 2020-08-30 NOTE — Telephone Encounter (Signed)
Attempted to call patient, left message for patient to call back to office.   

## 2020-08-30 NOTE — Telephone Encounter (Signed)
° ° ° °  Pt c/o medication issue:  1. Name of Medication:  rosuvastatin (CRESTOR) 5 MG tablet    2. How are you currently taking this medication (dosage and times per day)? Take 1 tablet (5 mg total) by mouth daily for 14 days, THEN 2 tablets (10 mg total) daily for 14 days, THEN 3 tablets (15 mg total) daily for 14 days.  3. Are you having a reaction (difficulty breathing--STAT)?   4. What is your medication issue? Pt said she was told by her pharmacy her insurance wont pay for medication due to how the script is. She said insurance will only going to pay one tablet a day. She said she is high risk and would appreciate if she can get this fix today.

## 2020-08-30 NOTE — Progress Notes (Signed)
Cardiology Office Note:    Date:  08/30/2020   ID:  Jackie Wade, DOB 06-29-1946, MRN [id number]  PCP:  Willey Blade, MD  Cardiologist:  Buford Dresser, MD  Referring MD: Willey Blade, MD   CC: new patient evaluation for mixed hyperlipidemia and cardiovascular risk  History of Present Illness:    Jackie Wade is a 74 y.o. female with a hx of type II diabetes, hypertension, mixed hyperlipidemia who is seen as a new consult at the request of Willey Blade, MD for the evaluation and management of mixed hyperlipidemia and cardiovascular risk.  No note received, but copy of recent labs sent from Dr. Joelene Millin Shelton's office. LP-PLA2 activity normal at 125 (<225 normal), MPO 574 elevated (ULN 469), c-CRP 1.42 (high >3), TSH normal, ApoA1/B elevated (A1 256, ULN 209; B 106, normal <90), normal ratio. NMR lipid profile shows LDL 174, LDL particle 1466 (normal <1000), HDL 96, TG 165, Tchol 299CMP notable for elevated glucose at 398, Cr 1.67.  Today: Reports that she had a test done, had "green dots" on a diagram that there was something in her heart, told she "had a picture but needed a film." Her main concern is whether something is wrong with her heart. Diagnosed with hypertension 03/2020, has had difficulty controlling this. Took one medication, was very sick, couldn't see. She cannot remember the name of this medication. Given a second medication, helped slightly but had lethargy. Tried on a third medication that made her extremely dizzy, only took for one day. Went to [employer], bought blood pressure factor supplement, takes 3x/day. This works better than any of the other medications she has tried.  Hasn't felt like herself in months. Has a great deal of stress, has had a lot of loss in her inner circle of people in recent months.  I attempted to review Care Everywhere. I cannot see notes, but document from 04/07/20 notes losartan and chlorthalidone. Also listed as  valsartan-HCTZ. She is not taking any of these right now. Does not recall which ones she was taking at which time. Does note that the diabetes medication is working.   Taking BP at home the last three days. Noted 502 systolic, recheck in the 774J.  Cardiovascular risk factors: Prior clinical ASCVD: none Comorbid conditions: Endorses hypertension, hyperlipidemia, diabetes. Denies chronic kidney disease:  Metabolic syndrome/Obesity: BMI 24 Chronic inflammatory conditions: none Tobacco use history: Family history: Prior cardiac testing and/or incidental findings on other testing (ie coronary calcium): reviewed lipids, above Exercise level: Current diet: Very careful with her diet, has worked hard on this.  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  Past Medical History:  Diagnosis Date  . Diabetes mellitus without complication [facility])     Past Surgical History:  Procedure Laterality Date  . WRIST SURGERY      Current Medications: Current Outpatient Medications on File Prior to Visit  Medication Sig  . B Complex-Biotin-FA (B COMPLETE PO) Take 1 tablet by mouth daily.  . Cholecalciferol (VITAMIN D-3) 125 MCG (5000 UT) TABS Take 1 tablet by mouth daily.  Marland Kitchen JANUVIA 100 MG tablet Take 100 mg by mouth daily.  . metFORMIN (GLUCOPHAGE) 500 MG tablet Take 1 tablet (500 mg total) by mouth 2 (two) times daily with a meal.   No current facility-administered medications on file prior to visit.     Allergies:   Patient has no known allergies.   Social History  Tobacco Use  . Smoking status: Never Smoker  . Smokeless tobacco: Never Used  Substance Use Topics  . Alcohol use: No  . Drug use: No    Family History: Denies family history of premature cardiovascular disease  ROS:   Please see the history of present illness.  Additional pertinent ROS: Constitutional: Negative for chills, fever, night sweats,  unintentional weight loss  HENT: Negative for ear pain and hearing loss.   Eyes: Negative for loss of vision and eye pain.  Respiratory: Negative for cough, sputum, wheezing.   Cardiovascular: See HPI. Gastrointestinal: Negative for abdominal pain, melena, and hematochezia.  Genitourinary: Negative for dysuria and hematuria.  Musculoskeletal: Negative for falls and myalgias.  Skin: Negative for itching and rash.  Neurological: Negative for focal weakness, focal sensory changes and loss of consciousness.  Endo/Heme/Allergies: Does not bruise/bleed easily.     EKGs/Labs/Other Studies Reviewed:    The following studies were reviewed today: No prior cardiac studies  EKG:  EKG is personally reviewed.  The ekg ordered today demonstrates NSR at 88 bpm  Recent Labs: No results found for requested labs within last 8760 hours.  Recent Lipid Panel No results found for: CHOL, TRIG, HDL, CHOLHDL, VLDL, LDLCALC, LDLDIRECT  Physical Exam:    VS:  BP (!) 164/90   Pulse 88   Ht 5\' 3"  (1.6 m)   Wt 136 lb 3.2 oz (61.8 kg)   BMI 24.13 kg/m     Wt Readings from Last 3 Encounters:  08/30/20 136 lb 3.2 oz (61.8 kg)    GEN: Well nourished, well developed in no acute distress HEENT: Normal, moist mucous membranes NECK: No JVD CARDIAC: regular rhythm, normal S1 and S2, no rubs or gallops. No murmurs. VASCULAR: Radial and DP pulses 2+ bilaterally. No carotid bruits RESPIRATORY:  Clear to auscultation without rales, wheezing or rhonchi  ABDOMEN: Soft, non-tender, non-distended MUSCULOSKELETAL:  Ambulates independently SKIN: Warm and dry, no edema NEUROLOGIC:  Alert and oriented x 3. No focal neuro deficits noted. PSYCHIATRIC:  Normal affect    ASSESSMENT:    1. Essential hypertension   2. Hyperlipidemia, unspecified hyperlipidemia type   3. Type 2 diabetes mellitus without complication, without long-term current use of insulin (HCC)   4. Cardiac risk counseling   5. Counseling on health  promotion and disease prevention    PLAN:    Hypertension: goal <130/80, elevated today -she is not currently on a medication for her blood pressure. Has had medication intolerance issues in the past -wishes to change one medication at a time, will start with statin (below) -if BP elevated at followup, would restart an agent  Type II diabetes, without obesity Mixed hyperlipidemia Cardiac risk counseling and prevention recommendations -ASCVD risk very elevated (10 yr risk 59% with this tool, 41% online calculator) -discussed recommendations for aspirin and statin with diabetes -discussed pathology of cholesterol, how plaques form, that MI/CVA result commonly from acute plaque rupture and not gradual stenosis. Discussed mechanism of statin to both decrease plaque accumulation and stabilize plaque that is already present. Discussed that calcium is a marker for plaque, with decades of validated data regarding average amounts of calcium for age/gender/ethnicity, as well as value of calcium score for risk stratification -we discussed the data on statins, both in terms of their long term benefit as well as the risk of side effects. Reviewed common misconceptions about statins. Reviewed how we monitor treatment. After shared decision making, patient is agreeable to trialing statin. -recommend heart healthy/Mediterranean diet, with  whole grains, fruits, vegetable, fish, lean meats, nuts, and olive oil. Limit salt. -recommend moderate walking, 3-5 times/week for 30-50 minutes each session. Aim for at least 150 minutes.week. Goal should be pace of 3 miles/hours, or walking 1.5 miles in 30 minutes -recommend avoidance of tobacco products. Avoid excess alcohol. -ASCVD risk score: online 41% The 10-year ASCVD risk score Mikey Bussing DC Jr., et al., 2013) is: 59.1%   Values used to calculate the score:     Age: 36 years     Sex: Female     Is Non-Hispanic African American: Yes     Diabetic: Yes     Tobacco  smoker: No     Systolic Blood Pressure: 440 mmHg     Is BP treated: No     HDL Cholesterol: 96 mg/dL     Total Cholesterol: 299 mg/dL    Plan for follow up: 4-6 weeks  Total time of encounter: 61 minutes total time of encounter, including 58 minutes spent in face-to-face patient care. This time includes coordination of care and counseling regarding lipids, CV risk, guidelines, and recommendation for management. Remainder of non-face-to-face time involved reviewing chart documents/testing relevant to the patient encounter and documentation in the medical record.  Buford Dresser, MD, PhD [place]  [facility]   Medication Adjustments/Labs and Tests Ordered: Current medicines are reviewed at length with the patient today.  Concerns regarding medicines are outlined above.  Orders Placed This Encounter  Procedures  . EKG 12-Lead   Meds ordered this encounter  Medications  . rosuvastatin (CRESTOR) 5 MG tablet    Sig: Take 1 tablet (5 mg total) by mouth daily for 14 days, THEN 2 tablets (10 mg total) daily for 14 days, THEN 3 tablets (15 mg total) daily for 14 days.    Dispense:  84 tablet    Refill:  0    Patient Instructions  Medication Instructions:  Rosuvastatin Take 1 tablet (5 mg total) by mouth daily for 14 days, THEN 2 tablets (10 mg total) daily for 14 days, THEN 3 tablets (15 mg total) daily for 14 days.  *If you need a refill on your cardiac medications before your next appointment, please call your pharmacy*   Lab Work: None ordered   Testing/Procedures: None ordered    Follow-Up: At [facility], you and your health needs are our priority.  As part of our continuing mission to provide you with exceptional heart care, we have created designated Provider Care Teams.  These Care Teams include your primary Cardiologist (physician) and Advanced Practice Providers (APPs -  Physician Assistants and Nurse Practitioners) who all work together to provide you  with the care you need, when you need it.  We recommend signing up for the patient portal called "MyChart".  Sign up information is provided on this After Visit Summary.  MyChart is used to connect with patients for Virtual Visits (Telemedicine).  Patients are able to view lab/test results, encounter notes, upcoming appointments, etc.  Non-urgent messages can be sent to your provider as well.   To learn more about what you can do with MyChart, go to [email or web address].    Your next appointment:   4-6 week(s)  The format for your next appointment:   In Person  Provider:   Buford Dresser, MD   Other Instructions -how to check blood pressure: prefer an arm cuff  -sit comfortably in a chair, feet uncrossed and flat on floor, for 5-10 minutes  -arm ideally should rest at the  level of the heart. However, arm should be relaxed and not tense (for example, do not hold the arm up unsupported)  -avoid exercise, caffeine, and tobacco for at least 30 minutes prior to BP reading  -don't take BP cuff reading over clothes (always place on skin directly)  -I prefer to know how well the medication is working, so I would like you to take your readings 1-2 hours after taking your blood pressure medication if possible   Signed, Buford Dresser, MD PhD 08/30/2020  [place]

## 2020-08-30 NOTE — Patient Instructions (Addendum)
Medication Instructions:  Rosuvastatin Take 1 tablet (5 mg total) by mouth daily for 14 days, THEN 2 tablets (10 mg total) daily for 14 days, THEN 3 tablets (15 mg total) daily for 14 days.  *If you need a refill on your cardiac medications before your next appointment, please call your pharmacy*   Lab Work: None ordered   Testing/Procedures: None ordered    Follow-Up: At [facility], you and your health needs are our priority.  As part of our continuing mission to provide you with exceptional heart care, we have created designated Provider Care Teams.  These Care Teams include your primary Cardiologist (physician) and Advanced Practice Providers (APPs -  Physician Assistants and Nurse Practitioners) who all work together to provide you with the care you need, when you need it.  We recommend signing up for the patient portal called "MyChart".  Sign up information is provided on this After Visit Summary.  MyChart is used to connect with patients for Virtual Visits (Telemedicine).  Patients are able to view lab/test results, encounter notes, upcoming appointments, etc.  Non-urgent messages can be sent to your provider as well.   To learn more about what you can do with MyChart, go to [email or web address].    Your next appointment:   4-6 week(s)  The format for your next appointment:   In Person  Provider:   Buford Dresser, MD   Other Instructions -how to check blood pressure: prefer an arm cuff  -sit comfortably in a chair, feet uncrossed and flat on floor, for 5-10 minutes  -arm ideally should rest at the level of the heart. However, arm should be relaxed and not tense (for example, do not hold the arm up unsupported)  -avoid exercise, caffeine, and tobacco for at least 30 minutes prior to BP reading  -don't take BP cuff reading over clothes (always place on skin directly)  -I prefer to know how well the medication is working, so I would like you to take your readings  1-2 hours after taking your blood pressure medication if possible

## 2020-09-01 NOTE — Telephone Encounter (Signed)
WILL NEED ADDITIONAL REFILLS ON ROSUVASTATIN SEE NOTE ON  WHY .Jackie Wade

## 2020-09-15 DIAGNOSIS — E782 Mixed hyperlipidemia: Secondary | ICD-10-CM | POA: Diagnosis not present

## 2020-09-15 DIAGNOSIS — E089 Diabetes mellitus due to underlying condition without complications: Secondary | ICD-10-CM | POA: Diagnosis not present

## 2020-09-15 DIAGNOSIS — I10 Essential (primary) hypertension: Secondary | ICD-10-CM | POA: Diagnosis not present

## 2020-09-15 DIAGNOSIS — R634 Abnormal weight loss: Secondary | ICD-10-CM | POA: Diagnosis not present

## 2020-09-15 DIAGNOSIS — E0840 Diabetes mellitus due to underlying condition with diabetic neuropathy, unspecified: Secondary | ICD-10-CM | POA: Diagnosis not present

## 2020-09-30 DIAGNOSIS — Z6823 Body mass index (BMI) 23.0-23.9, adult: Secondary | ICD-10-CM | POA: Diagnosis not present

## 2020-10-02 DIAGNOSIS — Z4681 Encounter for fitting and adjustment of insulin pump: Secondary | ICD-10-CM | POA: Diagnosis not present

## 2020-10-09 ENCOUNTER — Other Ambulatory Visit: Payer: Self-pay | Admitting: Cardiology

## 2020-10-09 DIAGNOSIS — E785 Hyperlipidemia, unspecified: Secondary | ICD-10-CM

## 2020-10-09 DIAGNOSIS — E119 Type 2 diabetes mellitus without complications: Secondary | ICD-10-CM

## 2020-10-12 ENCOUNTER — Ambulatory Visit: Payer: Medicare Other | Admitting: Cardiology

## 2020-10-15 ENCOUNTER — Other Ambulatory Visit: Payer: Self-pay

## 2020-10-15 ENCOUNTER — Encounter: Payer: Self-pay | Admitting: Cardiology

## 2020-10-15 ENCOUNTER — Ambulatory Visit: Payer: Medicare Other | Admitting: Cardiology

## 2020-10-15 VITALS — BP 198/92 | HR 93 | Ht 63.0 in | Wt 136.2 lb

## 2020-10-15 DIAGNOSIS — E782 Mixed hyperlipidemia: Secondary | ICD-10-CM

## 2020-10-15 DIAGNOSIS — I10 Essential (primary) hypertension: Secondary | ICD-10-CM | POA: Diagnosis not present

## 2020-10-15 DIAGNOSIS — E119 Type 2 diabetes mellitus without complications: Secondary | ICD-10-CM

## 2020-10-15 DIAGNOSIS — Z7189 Other specified counseling: Secondary | ICD-10-CM | POA: Diagnosis not present

## 2020-10-15 DIAGNOSIS — E785 Hyperlipidemia, unspecified: Secondary | ICD-10-CM

## 2020-10-15 MED ORDER — ROSUVASTATIN CALCIUM 5 MG PO TABS: 15.0000 mg | ORAL_TABLET | Freq: Every day | ORAL | 0 refills | Status: DC

## 2020-10-15 MED ORDER — ROSUVASTATIN CALCIUM 10 MG PO TABS: 15.0000 mg | ORAL_TABLET | Freq: Every day | ORAL | 0 refills | Status: DC

## 2020-10-15 NOTE — Progress Notes (Signed)
Cardiology Office Note:    Date:  10/15/2020   ID:  Jackie Wade, DOB 1946-10-30, MRN [id number]  PCP:  Jackie Blade, MD  Cardiologist:  Jackie Dresser, MD  Referring MD: Jackie Blade, MD   CC: follow up  History of Present Illness:    Jackie Wade is a 74 y.o. female with a hx of type II diabetes, hypertension, mixed hyperlipidemia who is seen for follow up today. I initially met her 08/30/20 as a new consult at the request of Jackie Blade, MD for the evaluation and management of mixed hyperlipidemia and cardiovascular risk.  Prior labs: LP-PLA2 activity normal at 125 (<225 normal), MPO 574 elevated (ULN 469), c-CRP 1.42 (high >3), TSH normal, ApoA1/B elevated (A1 256, ULN 209; B 106, normal <90), normal ratio. NMR lipid profile shows LDL 174, LDL particle 1466 (normal <1000), HDL 96, TG 165, Tchol 299CMP notable for elevated glucose at 398, Cr 1.67.  Today: Now seeing Dr. Criss Wade again. Updated today. She recently had medications changed, unsure of her list but will send a message to [place] with the updated results. Appears she may be on amlodipine 2.5 mg daily and indapamide 12.5 mg daily. There are old prescriptions for chlorthalidone, valsartan-HCTZ, but she is not sure what she is taking.  Feeling better on current regimen. Hasn't checked home blood pressures yet, going to get an arm automatic cuff instead of wrist. In the PCP's office, BP was 016 systolic. Very elevated today. BP 219/90 on initial check. No focal neuro symptoms today.   We discussed lifestyle recommendations. She is trying to make good choices about diet and exercise. Has been trying hard to not use salt.   Denies chest pain, shortness of breath at rest or with normal exertion. No PND, orthopnea, LE edema or unexpected weight gain. No syncope or palpitations.  Past Medical History:  Diagnosis Date  . Diabetes mellitus without complication [facility])     Past Surgical History:  Procedure  Laterality Date  . WRIST SURGERY      Current Medications: Current Outpatient Medications on File Prior to Visit  Medication Sig  . JANUVIA 100 MG tablet Take 100 mg by mouth daily.  . metFORMIN (GLUCOPHAGE) 500 MG tablet Take 1 tablet (500 mg total) by mouth 2 (two) times daily with a meal.  . rosuvastatin (CRESTOR) 5 MG tablet TAKE 1 TABLET DAILY FOR 14 DAYS, THEN 2TABS FOR 14 DAYS,THEN 3 TABS DAILY   No current facility-administered medications on file prior to visit.     Allergies:   Patient has no known allergies.   Social History   Tobacco Use  . Smoking status: Never Smoker  . Smokeless tobacco: Never Used  Substance Use Topics  . Alcohol use: No  . Drug use: No    Family History: Denies family history of premature cardiovascular disease  ROS:   Please see the history of present illness.  Additional pertinent ROS otherwise unremarkable  EKGs/Labs/Other Studies Reviewed:    The following studies were reviewed today: No prior cardiac studies  EKG:  EKG is personally reviewed.  The ekg ordered 08/30/20 demonstrates NSR at 88 bpm  Recent Labs: No results found for requested labs within last 8760 hours.  Recent Lipid Panel No results found for: CHOL, TRIG, HDL, CHOLHDL, VLDL, LDLCALC, LDLDIRECT  Physical Exam:    VS:  BP (!) 198/92   Pulse 93   Ht _0  (1.6 m)   Wt 136 lb 3.2 oz (61.8 kg)   SpO2 99%  BMI 24.13 kg/m     Wt Readings from Last 3 Encounters:  10/15/20 136 lb 3.2 oz (61.8 kg)  08/30/20 136 lb 3.2 oz (61.8 kg)    GEN: Well nourished, well developed in no acute distress HEENT: Normal, moist mucous membranes NECK: No JVD CARDIAC: regular rhythm, normal S1 and S2, no rubs or gallops. No murmur. VASCULAR: Radial and DP pulses 2+ bilaterally. No carotid bruits RESPIRATORY:  Clear to auscultation without rales, wheezing or rhonchi  ABDOMEN: Soft, non-tender, non-distended MUSCULOSKELETAL:  Ambulates independently SKIN: Warm and dry, no  edema NEUROLOGIC:  Alert and oriented x 3. No focal neuro deficits noted. PSYCHIATRIC:  Normal affect   ASSESSMENT:    1. Essential hypertension   2. Mixed hyperlipidemia   3. Type 2 diabetes mellitus without complication, without long-term current use of insulin (HCC)   4. Cardiac risk counseling   5. Counseling on health promotion and disease prevention    PLAN:    Hypertension: goal <130/80, very elevated today -she has restarted with Dr. Criss Wade, is on medications but unsure of which. She will contact us to let us know what these medications are -asymptomatic, reviewed red flag signs that need immediate medical attention  Type II diabetes, without obesity Mixed hyperlipidemia Cardiac risk counseling and prevention recommendations -ASCVD risk very elevated (10 yr risk 59% with this tool, 41% online calculator) -tolerating rosuvastatin, will slowly uptitrate to 15 mg daily -recommend heart healthy/Mediterranean diet, with whole grains, fruits, vegetable, fish, lean meats, nuts, and olive oil. Limit salt. -recommend moderate walking, 3-5 times/week for 30-50 minutes each session. Aim for at least 150 minutes.week. Goal should be pace of 3 miles/hours, or walking 1.5 miles in 30 minutes -recommend avoidance of tobacco products. Avoid excess alcohol. -ASCVD risk score: online 41% The ASCVD Risk score Jackie Wade DC Jr., et al., 2013) failed to calculate for the following reasons:   The valid systolic blood pressure range is 90 to 200 mmHg    Plan for follow up: 1 month to monitor BP  Jackie Dresser, MD, PhD [place]  [facility]   Medication Adjustments/Labs and Tests Ordered: Current medicines are reviewed at length with the patient today.  Concerns regarding medicines are outlined above.  No orders of the defined types were placed in this encounter.  Meds ordered this encounter  Medications  . DISCONTD: rosuvastatin (CRESTOR) 5 MG tablet    Sig: Take 3 tablets (15 mg  total) by mouth daily.    Dispense:  90 tablet    Refill:  0  . DISCONTD: rosuvastatin (CRESTOR) 10 MG tablet    Sig: Take 1.5 tablets (15 mg total) by mouth daily.    Dispense:  45 tablet    Refill:  0    Patient Instructions  Medication Instructions:  Take Rosuvastatin 10 mg- 1 1/2 tablet ($RemoveBef'15mg'[id number]$ ) daily.    *If you need a refill on your cardiac medications before your next appointment, please call your pharmacy*   Lab Work: None   Testing/Procedures: None   Follow-Up: At [facility], you and your health needs are our priority.  As part of our continuing mission to provide you with exceptional heart care, we have created designated Provider Care Teams.  These Care Teams include your primary Cardiologist (physician) and Advanced Practice Providers (APPs -  Physician Assistants and Nurse Practitioners) who all work together to provide you with the care you need, when you need it.  We recommend signing up for the patient portal called "MyChart".  Sign up information is provided on this After Visit Summary.  MyChart is used to connect with patients for Virtual Visits (Telemedicine).  Patients are able to view lab/test results, encounter notes, upcoming appointments, etc.  Non-urgent messages can be sent to your provider as well.   To learn more about what you can do with MyChart, go to [email or web address].    Your next appointment:   Dec. 17, 2021 at 9:20 am  The format for your next appointment:   Virtual Visit   Provider:   Buford Dresser, MD       Signed, Jackie Dresser, MD PhD 10/15/2020  [place]

## 2020-10-15 NOTE — Patient Instructions (Signed)
Medication Instructions:  Take Rosuvastatin 10 mg- 1 1/2 tablet (15mg ) daily.    *If you need a refill on your cardiac medications before your next appointment, please call your pharmacy*   Lab Work: None   Testing/Procedures: None   Follow-Up: At [facility], you and your health needs are our priority.  As part of our continuing mission to provide you with exceptional heart care, we have created designated Provider Care Teams.  These Care Teams include your primary Cardiologist (physician) and Advanced Practice Providers (APPs -  Physician Assistants and Nurse Practitioners) who all work together to provide you with the care you need, when you need it.  We recommend signing up for the patient portal called "MyChart".  Sign up information is provided on this After Visit Summary.  MyChart is used to connect with patients for Virtual Visits (Telemedicine).  Patients are able to view lab/test results, encounter notes, upcoming appointments, etc.  Non-urgent messages can be sent to your provider as well.   To learn more about what you can do with MyChart, go to [email or web address].    Your next appointment:   Dec. 17, 2021 at 9:20 am  The format for your next appointment:   Virtual Visit   Provider:   Buford Dresser, MD

## 2020-10-16 DIAGNOSIS — Z4681 Encounter for fitting and adjustment of insulin pump: Secondary | ICD-10-CM | POA: Diagnosis not present

## 2020-10-16 DIAGNOSIS — E0840 Diabetes mellitus due to underlying condition with diabetic neuropathy, unspecified: Secondary | ICD-10-CM | POA: Diagnosis not present

## 2020-10-20 DIAGNOSIS — N189 Chronic kidney disease, unspecified: Secondary | ICD-10-CM | POA: Diagnosis not present

## 2020-10-20 DIAGNOSIS — I110 Hypertensive heart disease with heart failure: Secondary | ICD-10-CM | POA: Diagnosis not present

## 2020-10-20 DIAGNOSIS — I10 Essential (primary) hypertension: Secondary | ICD-10-CM | POA: Diagnosis not present

## 2020-10-20 DIAGNOSIS — E0821 Diabetes mellitus due to underlying condition with diabetic nephropathy: Secondary | ICD-10-CM | POA: Diagnosis not present

## 2020-10-25 DIAGNOSIS — Z1211 Encounter for screening for malignant neoplasm of colon: Secondary | ICD-10-CM | POA: Diagnosis not present

## 2020-10-25 DIAGNOSIS — E1165 Type 2 diabetes mellitus with hyperglycemia: Secondary | ICD-10-CM | POA: Diagnosis not present

## 2020-10-25 DIAGNOSIS — I10 Essential (primary) hypertension: Secondary | ICD-10-CM | POA: Diagnosis not present

## 2020-10-30 DIAGNOSIS — Z4681 Encounter for fitting and adjustment of insulin pump: Secondary | ICD-10-CM | POA: Diagnosis not present

## 2020-11-04 ENCOUNTER — Other Ambulatory Visit: Payer: Self-pay | Admitting: Cardiology

## 2020-11-12 ENCOUNTER — Telehealth (INDEPENDENT_AMBULATORY_CARE_PROVIDER_SITE_OTHER): Payer: Medicare Other | Admitting: Cardiology

## 2020-11-12 ENCOUNTER — Encounter: Payer: Self-pay | Admitting: Cardiology

## 2020-11-12 DIAGNOSIS — Z9189 Other specified personal risk factors, not elsewhere classified: Secondary | ICD-10-CM

## 2020-11-12 DIAGNOSIS — Z7189 Other specified counseling: Secondary | ICD-10-CM | POA: Diagnosis not present

## 2020-11-12 DIAGNOSIS — I10 Essential (primary) hypertension: Secondary | ICD-10-CM | POA: Insufficient documentation

## 2020-11-12 DIAGNOSIS — E119 Type 2 diabetes mellitus without complications: Secondary | ICD-10-CM | POA: Diagnosis not present

## 2020-11-12 DIAGNOSIS — E785 Hyperlipidemia, unspecified: Secondary | ICD-10-CM | POA: Diagnosis not present

## 2020-11-12 NOTE — Progress Notes (Signed)
Virtual Visit via Telephone Note   This visit type was conducted due to national recommendations for restrictions regarding the COVID-19 Pandemic (e.g. social distancing) in an effort to limit this patient's exposure and mitigate transmission in our community.  Due to her co-morbid illnesses, this patient is at least at moderate risk for complications without adequate follow up.  This format is felt to be most appropriate for this patient at this time.  The patient did not have access to video technology/had technical difficulties with video requiring transitioning to audio format only (telephone).  All issues noted in this document were discussed and addressed.  No physical exam could be performed with this format.  Please refer to the patient's chart for her  consent to telehealth for [facility].   The patient was identified using 2 identifiers.  Patient Location: Home Provider Location: Home Office  Date:  11/12/2020   ID:  Jackie Wade, DOB [birth year], MRN [id number]  PCP:  Lucianne Lei, MD  Cardiologist:  Buford Dresser, MD  Referring MD: Lucianne Lei, MD   CC: follow up  History of Present Illness:    Jackie Wade is a 74 y.o. female with a hx of type II diabetes, hypertension, mixed hyperlipidemia who is seen for follow up today. I initially met her 10/15/20 as a new consult at the request of Lucianne Lei, MD for the evaluation and management of mixed hyperlipidemia and cardiovascular risk.  Today: Feels lethargic, lack of energy for the last few days. She can push herself through it if she really has to, but other days she can't get to work. When she doesn't take the high blood pressure medication, she feels better. However, if she skips for a few days, she can tell that her blood pressure goes up  Seeing [name] every 2 weeks, blood sugar much better controlled. Her BP in the office last with [name] was 209O systolic. She reports that [name] was happy  with this. She confirms that the only medications she is currently taking for blood pressure are amlodipine 2.5 mg daily and indapamide 1.25 mg daily.   Avoiding salt, trying to stay active. Only one incident of leg edema in the last month. Hydroxyzine helps with sleep. Trying to walk more, sometimes walks with no limitations, sometimes feels mildly short of breath when she really pushes herself. Trying to stay calm and minimize stress at work.  Not checking BP at home, does have arm cuff.   Discussed trialing one medication at a time to see what makes her feel poorly. Take one medication for one week, see how she feels. Then switch to other medication for a week. See if she tolerates one more than the other. Once we know which she tolerates, we will work on uptitrating one medication at a time to get BP to goal.   Past Medical History:  Diagnosis Date  . Diabetes mellitus without complication [facility])     Past Surgical History:  Procedure Laterality Date  . WRIST SURGERY      Current Medications: Current Outpatient Medications on File Prior to Visit  Medication Sig  . amLODipine (NORVASC) 2.5 MG tablet Take 2.5 mg by mouth daily. Take 1 tablet (2.5 mg) by mouth at night for 10 days. Then increase to 2 tablet (5 mg) at night  . hydrOXYzine (VISTARIL) 25 MG capsule Take 25 mg by mouth at bedtime as needed.  . indapamide (LOZOL) 1.25 MG tablet Take 1.25 mg by mouth every morning.  Marland Kitchen  JANUVIA 100 MG tablet Take 100 mg by mouth daily.  . metFORMIN (GLUCOPHAGE) 500 MG tablet Take by mouth daily with breakfast.  . rosuvastatin (CRESTOR) 10 MG tablet TAKE 1 AND 1/2 TABLETS DAILY BY MOUTH   No current facility-administered medications on file prior to visit.     Allergies:   Patient has no known allergies.   Social History   Tobacco Use  . Smoking status: Never Smoker  . Smokeless tobacco: Never Used  Substance Use Topics  . Alcohol use: No  . Drug use: No    Family History: Denies  family history of premature cardiovascular disease  ROS:   Please see the history of present illness.  Additional pertinent ROS otherwise unremarkable.  EKGs/Labs/Other Studies Reviewed:    The following studies were reviewed today: No prior cardiac studies  EKG:  EKG is personally reviewed.  The ekg ordered 08/30/20 demonstrates NSR at 88 bpm  Recent Labs: No results found for requested labs within last 8760 hours.  Recent Lipid Panel No results found for: CHOL, TRIG, HDL, CHOLHDL, VLDL, LDLCALC, LDLDIRECT  Physical Exam:    VS:  There were no vitals taken for this visit.    Wt Readings from Last 3 Encounters:  10/15/20 136 lb 3.2 oz (61.8 kg)  08/30/20 136 lb 3.2 oz (61.8 kg)    Speaking comfortably on the phone, no audible wheezing In no acute distress Alert and oriented Normal affect Normal speech  ASSESSMENT:    1. Essential hypertension   2. Type 2 diabetes mellitus without complication, without long-term current use of insulin (HCC)   3. Cardiac risk counseling   4. Counseling on health promotion and disease prevention   5. At increased risk for cardiovascular disease   6. Hyperlipidemia, unspecified hyperlipidemia type    PLAN:    Hypertension: goal <130/80, -remains elevated based on report -on amlodipine 2.5 mg daily and indapamide 1.25 mg daily -we discussed options. After shared decision making, will try to take one medication at a time each week to see which makes her feel poorly -based on this, would then manage/maximize agent she tolerates first before adding another  Type II diabetes, without obesity Mixed hyperlipidemia -see elevated ASCVD risk, below -discussed recommendations for aspirin and statin with diabetes -tolerating rosuvastatin 10 mg daily. Recheck lipids at follow up if not done sooner -continue to discuss aspirin  Cardiac risk counseling and prevention recommendations -ASCVD risk very elevated (10 yr risk 59% with this tool, 41%  online calculator) -recommend heart healthy/Mediterranean diet, with whole grains, fruits, vegetable, fish, lean meats, nuts, and olive oil. Limit salt. -recommend moderate walking, 3-5 times/week for 30-50 minutes each session. Aim for at least 150 minutes.week. Goal should be pace of 3 miles/hours, or walking 1.5 miles in 30 minutes -recommend avoidance of tobacco products. Avoid excess alcohol. -ASCVD risk score: online 41% The 10-year ASCVD risk score Mikey Bussing DC Jr., et al., 2013) is: 55.8%   Values used to calculate the score:     Age: 89 years     Sex: Female     Is Non-Hispanic African American: Yes     Diabetic: Yes     Tobacco smoker: No     Systolic Blood Pressure: 993 mmHg     Is BP treated: Yes     HDL Cholesterol: 96 mg/dL     Total Cholesterol: 299 mg/dL    Plan for follow up: 2 mos  Today, I have spent 25 minutes with the patient with  telehealth technology discussing the above problems.  Additional time spent in chart review, documentation, and communication.  Buford Dresser, MD, PhD [place]  [facility]   Medication Adjustments/Labs and Tests Ordered: Current medicines are reviewed at length with the patient today.  Concerns regarding medicines are outlined above.  No orders of the defined types were placed in this encounter.  No orders of the defined types were placed in this encounter.   Patient Instructions  Medication Instructions: Dr. Harrell Gave recommends you try one of your blood pressure medication (either amlodipine or indapamide) only for one week, then switch to the other one for one week. In about 2-3 weeks, please send a message about which medication your were able to tolerate better.    *If you need a refill on your cardiac medications before your next appointment, please call your pharmacy*   Lab Work: None   Testing/Procedures: None   Follow-Up: At [facility], you and your health needs are our priority.  As part of our  continuing mission to provide you with exceptional heart care, we have created designated Provider Care Teams.  These Care Teams include your primary Cardiologist (physician) and Advanced Practice Providers (APPs -  Physician Assistants and Nurse Practitioners) who all work together to provide you with the care you need, when you need it.  We recommend signing up for the patient portal called "MyChart".  Sign up information is provided on this After Visit Summary.  MyChart is used to connect with patients for Virtual Visits (Telemedicine).  Patients are able to view lab/test results, encounter notes, upcoming appointments, etc.  Non-urgent messages can be sent to your provider as well.   To learn more about what you can do with MyChart, go to [email or web address].    Your next appointment:   2 month(s)  The format for your next appointment:   In Person  Provider:   Buford Dresser, MD      Signed, Buford Dresser, MD PhD 11/12/2020  [place]

## 2020-11-12 NOTE — Patient Instructions (Signed)
Medication Instructions: Dr. Harrell Gave recommends you try one of your blood pressure medication (either amlodipine or indapamide) only for one week, then switch to the other one for one week. In about 2-3 weeks, please send a message about which medication your were able to tolerate better.    *If you need a refill on your cardiac medications before your next appointment, please call your pharmacy*   Lab Work: None   Testing/Procedures: None   Follow-Up: At [facility], you and your health needs are our priority.  As part of our continuing mission to provide you with exceptional heart care, we have created designated Provider Care Teams.  These Care Teams include your primary Cardiologist (physician) and Advanced Practice Providers (APPs -  Physician Assistants and Nurse Practitioners) who all work together to provide you with the care you need, when you need it.  We recommend signing up for the patient portal called "MyChart".  Sign up information is provided on this After Visit Summary.  MyChart is used to connect with patients for Virtual Visits (Telemedicine).  Patients are able to view lab/test results, encounter notes, upcoming appointments, etc.  Non-urgent messages can be sent to your provider as well.   To learn more about what you can do with MyChart, go to [email or web address].    Your next appointment:   2 month(s)  The format for your next appointment:   In Person  Provider:   Buford Dresser, MD

## 2020-11-13 DIAGNOSIS — E0840 Diabetes mellitus due to underlying condition with diabetic neuropathy, unspecified: Secondary | ICD-10-CM | POA: Diagnosis not present

## 2020-11-13 DIAGNOSIS — Z4681 Encounter for fitting and adjustment of insulin pump: Secondary | ICD-10-CM | POA: Diagnosis not present

## 2020-11-30 DIAGNOSIS — E0840 Diabetes mellitus due to underlying condition with diabetic neuropathy, unspecified: Secondary | ICD-10-CM | POA: Diagnosis not present

## 2020-11-30 DIAGNOSIS — Z4681 Encounter for fitting and adjustment of insulin pump: Secondary | ICD-10-CM | POA: Diagnosis not present

## 2021-01-03 DIAGNOSIS — I110 Hypertensive heart disease with heart failure: Secondary | ICD-10-CM | POA: Diagnosis not present

## 2021-01-03 DIAGNOSIS — I509 Heart failure, unspecified: Secondary | ICD-10-CM | POA: Diagnosis not present

## 2021-01-03 DIAGNOSIS — I10 Essential (primary) hypertension: Secondary | ICD-10-CM | POA: Diagnosis not present

## 2021-01-03 DIAGNOSIS — E1169 Type 2 diabetes mellitus with other specified complication: Secondary | ICD-10-CM | POA: Diagnosis not present

## 2021-01-20 ENCOUNTER — Ambulatory Visit: Payer: Medicare Other | Admitting: Cardiology

## 2021-01-28 ENCOUNTER — Telehealth: Payer: Self-pay | Admitting: Cardiology

## 2021-01-28 NOTE — Telephone Encounter (Signed)
   Received staff message:   Meryl Crutch, RN  P Cv Div Nl Scheduling Please schedule pt an office visit with Dr. Harrell Gave.  Called 3x with no response   01/21/21 8:41pm Patient states she will call back to schedule appt - LCN     01/24/21 9:09 am - called pt to r/s appt with Dr. Harrell Gave per staff message. VM is full - AH     01/26/21 8:22 am - called pt to r/s appt with Dr. Harrell Gave per staff message. VM is full - AH

## 2021-02-06 ENCOUNTER — Encounter: Payer: Self-pay | Admitting: Cardiology

## 2021-02-28 DIAGNOSIS — R600 Localized edema: Secondary | ICD-10-CM | POA: Diagnosis not present

## 2021-02-28 DIAGNOSIS — I10 Essential (primary) hypertension: Secondary | ICD-10-CM | POA: Diagnosis not present

## 2021-02-28 DIAGNOSIS — E0821 Diabetes mellitus due to underlying condition with diabetic nephropathy: Secondary | ICD-10-CM | POA: Diagnosis not present

## 2021-02-28 DIAGNOSIS — I509 Heart failure, unspecified: Secondary | ICD-10-CM | POA: Diagnosis not present

## 2021-02-28 DIAGNOSIS — N189 Chronic kidney disease, unspecified: Secondary | ICD-10-CM | POA: Diagnosis not present

## 2021-02-28 DIAGNOSIS — E1169 Type 2 diabetes mellitus with other specified complication: Secondary | ICD-10-CM | POA: Diagnosis not present

## 2021-02-28 DIAGNOSIS — D631 Anemia in chronic kidney disease: Secondary | ICD-10-CM | POA: Diagnosis not present

## 2021-02-28 DIAGNOSIS — I110 Hypertensive heart disease with heart failure: Secondary | ICD-10-CM | POA: Diagnosis not present

## 2021-02-28 DIAGNOSIS — E782 Mixed hyperlipidemia: Secondary | ICD-10-CM | POA: Diagnosis not present

## 2021-03-14 DIAGNOSIS — R809 Proteinuria, unspecified: Secondary | ICD-10-CM | POA: Diagnosis not present

## 2021-03-14 DIAGNOSIS — N179 Acute kidney failure, unspecified: Secondary | ICD-10-CM | POA: Diagnosis not present

## 2021-03-14 DIAGNOSIS — I129 Hypertensive chronic kidney disease with stage 1 through stage 4 chronic kidney disease, or unspecified chronic kidney disease: Secondary | ICD-10-CM | POA: Diagnosis not present

## 2021-03-14 DIAGNOSIS — N2581 Secondary hyperparathyroidism of renal origin: Secondary | ICD-10-CM | POA: Diagnosis not present

## 2021-03-14 DIAGNOSIS — N184 Chronic kidney disease, stage 4 (severe): Secondary | ICD-10-CM | POA: Diagnosis not present

## 2021-03-14 DIAGNOSIS — D631 Anemia in chronic kidney disease: Secondary | ICD-10-CM | POA: Diagnosis not present

## 2021-03-14 DIAGNOSIS — R3129 Other microscopic hematuria: Secondary | ICD-10-CM | POA: Diagnosis not present

## 2021-03-14 DIAGNOSIS — Z8616 Personal history of COVID-19: Secondary | ICD-10-CM | POA: Diagnosis not present

## 2021-03-21 DIAGNOSIS — I509 Heart failure, unspecified: Secondary | ICD-10-CM | POA: Diagnosis not present

## 2021-03-21 DIAGNOSIS — E782 Mixed hyperlipidemia: Secondary | ICD-10-CM | POA: Diagnosis not present

## 2021-03-21 DIAGNOSIS — E0821 Diabetes mellitus due to underlying condition with diabetic nephropathy: Secondary | ICD-10-CM | POA: Diagnosis not present

## 2021-03-21 DIAGNOSIS — R600 Localized edema: Secondary | ICD-10-CM | POA: Diagnosis not present

## 2021-03-21 DIAGNOSIS — I110 Hypertensive heart disease with heart failure: Secondary | ICD-10-CM | POA: Diagnosis not present

## 2021-03-21 DIAGNOSIS — E1169 Type 2 diabetes mellitus with other specified complication: Secondary | ICD-10-CM | POA: Diagnosis not present

## 2021-03-31 DIAGNOSIS — N179 Acute kidney failure, unspecified: Secondary | ICD-10-CM | POA: Diagnosis not present

## 2021-03-31 DIAGNOSIS — N189 Chronic kidney disease, unspecified: Secondary | ICD-10-CM | POA: Diagnosis not present

## 2021-03-31 DIAGNOSIS — D631 Anemia in chronic kidney disease: Secondary | ICD-10-CM | POA: Diagnosis not present

## 2021-03-31 DIAGNOSIS — R3129 Other microscopic hematuria: Secondary | ICD-10-CM | POA: Diagnosis not present

## 2021-03-31 DIAGNOSIS — I129 Hypertensive chronic kidney disease with stage 1 through stage 4 chronic kidney disease, or unspecified chronic kidney disease: Secondary | ICD-10-CM | POA: Diagnosis not present

## 2021-03-31 DIAGNOSIS — R609 Edema, unspecified: Secondary | ICD-10-CM | POA: Diagnosis not present

## 2021-03-31 DIAGNOSIS — N2581 Secondary hyperparathyroidism of renal origin: Secondary | ICD-10-CM | POA: Diagnosis not present

## 2021-03-31 DIAGNOSIS — N184 Chronic kidney disease, stage 4 (severe): Secondary | ICD-10-CM | POA: Diagnosis not present

## 2021-03-31 DIAGNOSIS — R809 Proteinuria, unspecified: Secondary | ICD-10-CM | POA: Diagnosis not present

## 2021-03-31 DIAGNOSIS — D472 Monoclonal gammopathy: Secondary | ICD-10-CM | POA: Diagnosis not present

## 2021-04-04 ENCOUNTER — Other Ambulatory Visit (HOSPITAL_COMMUNITY): Payer: Self-pay | Admitting: Nephrology

## 2021-04-04 DIAGNOSIS — D631 Anemia in chronic kidney disease: Secondary | ICD-10-CM

## 2021-04-04 DIAGNOSIS — R809 Proteinuria, unspecified: Secondary | ICD-10-CM

## 2021-04-04 DIAGNOSIS — D472 Monoclonal gammopathy: Secondary | ICD-10-CM

## 2021-04-04 DIAGNOSIS — N179 Acute kidney failure, unspecified: Secondary | ICD-10-CM

## 2021-04-04 DIAGNOSIS — N189 Chronic kidney disease, unspecified: Secondary | ICD-10-CM

## 2021-04-12 ENCOUNTER — Other Ambulatory Visit: Payer: Self-pay | Admitting: Student

## 2021-04-12 ENCOUNTER — Other Ambulatory Visit: Payer: Self-pay | Admitting: Radiology

## 2021-04-13 ENCOUNTER — Other Ambulatory Visit: Payer: Self-pay

## 2021-04-13 ENCOUNTER — Ambulatory Visit (HOSPITAL_COMMUNITY)
Admission: RE | Admit: 2021-04-13 | Discharge: 2021-04-13 | Disposition: A | Discharge status: Home / Self Care | Payer: Medicare Other | Source: Ambulatory Visit | Attending: Nephrology | Admitting: Nephrology

## 2021-04-13 DIAGNOSIS — R609 Edema, unspecified: Secondary | ICD-10-CM | POA: Insufficient documentation

## 2021-04-13 DIAGNOSIS — Z7984 Long term (current) use of oral hypoglycemic drugs: Secondary | ICD-10-CM | POA: Diagnosis not present

## 2021-04-13 DIAGNOSIS — R809 Proteinuria, unspecified: Secondary | ICD-10-CM | POA: Diagnosis not present

## 2021-04-13 DIAGNOSIS — I129 Hypertensive chronic kidney disease with stage 1 through stage 4 chronic kidney disease, or unspecified chronic kidney disease: Secondary | ICD-10-CM | POA: Insufficient documentation

## 2021-04-13 DIAGNOSIS — R3129 Other microscopic hematuria: Secondary | ICD-10-CM | POA: Diagnosis not present

## 2021-04-13 DIAGNOSIS — D472 Monoclonal gammopathy: Secondary | ICD-10-CM | POA: Diagnosis not present

## 2021-04-13 DIAGNOSIS — D631 Anemia in chronic kidney disease: Secondary | ICD-10-CM | POA: Diagnosis not present

## 2021-04-13 DIAGNOSIS — Z79899 Other long term (current) drug therapy: Secondary | ICD-10-CM | POA: Diagnosis not present

## 2021-04-13 DIAGNOSIS — N189 Chronic kidney disease, unspecified: Secondary | ICD-10-CM | POA: Insufficient documentation

## 2021-04-13 DIAGNOSIS — N179 Acute kidney failure, unspecified: Secondary | ICD-10-CM | POA: Diagnosis not present

## 2021-04-13 DIAGNOSIS — D649 Anemia, unspecified: Secondary | ICD-10-CM | POA: Diagnosis not present

## 2021-04-13 LAB — PROTIME-INR
INR: 1.1 (ref 0.8–1.2)
Prothrombin Time: 14.4 seconds (ref 11.4–15.2)

## 2021-04-13 LAB — GLUCOSE, CAPILLARY
Glucose-Capillary: 105 mg/dL — ABNORMAL HIGH (ref 70–99)
Glucose-Capillary: 102 mg/dL — ABNORMAL HIGH (ref 70–99)

## 2021-04-13 LAB — CBC
HCT: 24.3 % — ABNORMAL LOW (ref 36.0–46.0)
Hemoglobin: 8.1 g/dL — ABNORMAL LOW (ref 12.0–15.0)
MCH: 29.3 pg (ref 26.0–34.0)
MCHC: 33.3 g/dL (ref 30.0–36.0)
MCV: 88 fL (ref 80.0–100.0)
Platelets: 207 10*3/uL (ref 150–400)
RBC: 2.76 MIL/uL — ABNORMAL LOW (ref 3.87–5.11)
RDW: 12.3 % (ref 11.5–15.5)
WBC: 5.5 10*3/uL (ref 4.0–10.5)
nRBC: 0 % (ref 0.0–0.2)

## 2021-04-13 MED ORDER — FENTANYL CITRATE (PF) 100 MCG/2ML IJ SOLN
INTRAMUSCULAR | Status: AC
  Filled 2021-04-13: qty 2

## 2021-04-13 MED ORDER — MIDAZOLAM HCL 2 MG/2ML IJ SOLN
INTRAMUSCULAR | Status: AC
  Filled 2021-04-13: qty 2

## 2021-04-13 MED ORDER — FENTANYL CITRATE (PF) 100 MCG/2ML IJ SOLN
INTRAMUSCULAR | Status: AC | PRN
  Administered 2021-04-13: 50 ug via INTRAVENOUS

## 2021-04-13 MED ORDER — SODIUM CHLORIDE 0.9 % IV SOLN: INTRAVENOUS | Status: DC

## 2021-04-13 MED ORDER — LIDOCAINE HCL (PF) 1 % IJ SOLN
INTRAMUSCULAR | Status: AC
  Filled 2021-04-13: qty 30

## 2021-04-13 MED ORDER — MIDAZOLAM HCL 2 MG/2ML IJ SOLN
INTRAMUSCULAR | Status: AC | PRN
  Administered 2021-04-13: 1 mg via INTRAVENOUS

## 2021-04-13 MED ORDER — GELATIN ABSORBABLE 12-7 MM EX MISC
CUTANEOUS | Status: AC
  Filled 2021-04-13: qty 1

## 2021-04-13 NOTE — Procedures (Signed)
Interventional Radiology Procedure Note  Procedure: Ultrasound guided non-focal renal biopsy  Findings: Please refer to procedural dictation for full description. Left inferior pole cortex 16 ga core biopsy x2.  Gelfoam slurry needle track embolization.  Complications: None immediate  Estimated Blood Loss: < 5 mL  Recommendations: Strict 3 hours bedrest. Follow up Pathology results.   Ruthann Cancer, MD Pager: [phone]

## 2021-04-13 NOTE — Discharge Instructions (Addendum)
Percutaneous Kidney Biopsy, Care After This sheet gives you information about how to care for yourself after your procedure. Your health care provider may also give you more specific instructions. If you have problems or questions, contact your health care provider. What can I expect after the procedure? After the procedure, it is common to have:  Pain or soreness near the biopsy site.  Pink or cloudy urine for 24 hours after the procedure. This is normal. Follow these instructions at home: Activity  Return to your normal activities as told by your health care provider. Ask your health care provider what activities are safe for you.  If you were given a sedative during the procedure, it can affect you for several hours. Do not drive or operate machinery until your health care provider says that it is safe.  Do not lift anything that is heavier than 10 lb (4.5 kg), or the limit that you are told, until your health care provider says that it is safe.  Avoid activities that take a lot of effort until your health care provider approves. Most people will have to wait 2 weeks before returning to activities such as exercise or sex. General instructions  Take over-the-counter and prescription medicines only as told by your health care provider.  Follow instructions from your health care provider about eating or drinking restrictions.  Check your biopsy site every day for signs of infection. Check for: ? More redness, swelling, or pain. ? Fluid or blood. ? Warmth. ? Pus or a bad smell.  Keep all follow-up visits as told by your health care provider. This is important.   Contact a health care provider if:  You have more redness, swelling, or pain around your biopsy site.  You have fluid or blood coming from your biopsy site.  Your biopsy site feels warm to the touch.  You have pus or a bad smell coming from your biopsy site.  You have blood in your urine more than 24 hours after your  procedure. Get help right away if:  Your urine is dark red or brown.  You have a fever.  You are not able to urinate.  You feel burning when you urinate.  You feel dizzy or light-headed.  You have severe pain in your abdomen or side. Summary  After the procedure, it is common to have pain or soreness at the biopsy site and pink or cloudy urine for the first 24 hours.  Check your biopsy site each day for signs of infection, such as more redness, swelling, or pain; fluid, blood, pus or a bad smell coming from the biopsy site; or the biopsy site feeling warm to the touch.  Return to your normal activities as told by your health care provider. This information is not intended to replace advice given to you by your health care provider. Make sure you discuss any questions you have with your health care provider. Document Revised: 02/06/2020 Document Reviewed: 02/06/2020 Elsevier Patient Education  2021 Elsevier Inc.  Moderate Conscious Sedation, Adult, Care After This sheet gives you information about how to care for yourself after your procedure. Your health care provider may also give you more specific instructions. If you have problems or questions, contact your health care provider. What can I expect after the procedure? After the procedure, it is common to have:  Sleepiness for several hours.  Impaired judgment for several hours.  Difficulty with balance.  Vomiting if you eat too soon. Follow these instructions at home:   For the time period you were told by your health care provider:  Rest.  Do not participate in activities where you could fall or become injured.  Do not drive or use machinery.  Do not drink alcohol.  Do not take sleeping pills or medicines that cause drowsiness.  Do not make important decisions or sign legal documents.  Do not take care of children on your own.      Eating and drinking  Follow the diet recommended by your health care  provider.  Drink enough fluid to keep your urine pale yellow.  If you vomit: ? Drink water, juice, or soup when you can drink without vomiting. ? Make sure you have little or no nausea before eating solid foods.   General instructions  Take over-the-counter and prescription medicines only as told by your health care provider.  Have a responsible adult stay with you for the time you are told. It is important to have someone help care for you until you are awake and alert.  Do not smoke.  Keep all follow-up visits as told by your health care provider. This is important. Contact a health care provider if:  You are still sleepy or having trouble with balance after 24 hours.  You feel light-headed.  You keep feeling nauseous or you keep vomiting.  You develop a rash.  You have a fever.  You have redness or swelling around the IV site. Get help right away if:  You have trouble breathing.  You have new-onset confusion at home. Summary  After the procedure, it is common to feel sleepy, have impaired judgment, or feel nauseous if you eat too soon.  Rest after you get home. Know the things you should not do after the procedure.  Follow the diet recommended by your health care provider and drink enough fluid to keep your urine pale yellow.  Get help right away if you have trouble breathing or new-onset confusion at home. This information is not intended to replace advice given to you by your health care provider. Make sure you discuss any questions you have with your health care provider. Document Revised: 03/12/2020 Document Reviewed: 10/09/2019 Elsevier Patient Education  2021 Elsevier Inc.  

## 2021-04-13 NOTE — Consult Note (Signed)
Chief Complaint: Patient was seen in consultation today for image guided random renal biopsy  Referring Physician(s): Singh,Vikas  Supervising Physician: Ruthann Cancer  Patient Status: [facility] - Out-pt  History of Present Illness: Jackie Wade is a 75 y.o. female with PMH sig for DM,HTN, AKI, proteinuria, anemia, microscopic hematuria and monoclonal gammopathy. She presents today for image guided random core renal biopsy for further evaluation.   Past Medical History:  Diagnosis Date  . Diabetes mellitus without complication [facility])     Past Surgical History:  Procedure Laterality Date  . WRIST SURGERY      Allergies: Patient has no known allergies.  Medications: Prior to Admission medications   Medication Sig Start Date End Date Taking? Authorizing Provider  amLODipine (NORVASC) 5 MG tablet Take 5 mg by mouth daily.   Yes [provider]  furosemide (LASIX) 40 MG tablet Take 40-80 mg by mouth See admin instructions. 80 mg in the morning, and 40 mg at 4p   Yes [provider]  hydrOXYzine (VISTARIL) 25 MG capsule Take 25 mg by mouth at bedtime as needed. 10/28/20  Yes [provider]  indapamide (LOZOL) 1.25 MG tablet Take 1.25 mg by mouth every morning. 10/19/20  Yes [provider]  JANUVIA 100 MG tablet Take 100 mg by mouth daily. 07/08/20  Yes [provider]  metFORMIN (GLUCOPHAGE) 500 MG tablet Take 500 mg by mouth daily with breakfast.   Yes [provider]  rosuvastatin (CRESTOR) 10 MG tablet TAKE 1 AND 1/2 TABLETS DAILY BY MOUTH Patient taking differently: Take 15 mg by mouth daily. 11/04/20  Yes Buford Dresser, MD     No family history on file.  Social History   Socioeconomic History  . Marital status: Married    Spouse name: Not on file  . Number of children: Not on file  . Years of education: Not on file  . Highest education level: Not on file  Occupational History  . Not on file  Tobacco Use   . Smoking status: Never Smoker  . Smokeless tobacco: Never Used  Substance and Sexual Activity  . Alcohol use: No  . Drug use: No  . Sexual activity: Not on file  Other Topics Concern  . Not on file  Social History Narrative  . Not on file   Social Determinants of Health   Financial Resource Strain: Not on file  Food Insecurity: Not on file  Transportation Needs: Not on file  Physical Activity: Not on file  Stress: Not on file  Social Connections: Not on file      Review of Systems denies fever,HA, dyspnea, cough, abd /back pain,N/V or visible bleeding; she is anxious and has some chest discomfort  Vital Signs: BP (!) 159/89   Pulse 86   Temp 97.9 F (36.6 C) (Oral)   Ht 5\' 3"  (1.6 m)   Wt 133 lb (60.3 kg)   SpO2 100%   BMI 23.56 kg/m   Physical Exam awake/alert; chest- CTA bilat; heart- RRR; abd- soft,+BS,NT; bilat LE edema noted  Imaging: No results found.  Labs:  CBC: Recent Labs    04/13/21 0641  WBC 5.5  HGB 8.1*  HCT 24.3*  PLT 207    COAGS: Recent Labs    04/13/21 0641  INR 1.1    BMP: No results for input(s): NA, K, CL, CO2, GLUCOSE, BUN, CALCIUM, CREATININE, GFRNONAA, GFRAA in the last 8760 hours.  Invalid input(s): CMP  LIVER FUNCTION TESTS: No results for input(s):  BILITOT, AST, ALT, ALKPHOS, PROT, ALBUMIN in the last 8760 hours.  TUMOR MARKERS: No results for input(s): AFPTM, CEA, CA199, CHROMGRNA in the last 8760 hours.  Assessment and Plan: 75 y.o. female with PMH sig for DM,HTN, AKI, proteinuria, anemia, microscopic hematuria and monoclonal gammopathy. She presents today for image guided random core renal biopsy for further evaluation.Risks and benefits of procedure was discussed with the patient  including, but not limited to bleeding, infection, damage to adjacent structures or low yield requiring additional tests.  All of the questions were answered and there is agreement to proceed.  Consent signed and in  chart   Thank you for this interesting consult.  I greatly enjoyed meeting Miah Boye and look forward to participating in their care.  A copy of this report was sent to the requesting provider on this date.  Electronically Signed: D. Rowe Robert, PA-C 04/13/2021, 7:32 AM   I spent a total of 25 minutes    in face to face in clinical consultation, greater than 50% of which was counseling/coordinating care for image guided random renal biopsy

## 2021-04-15 ENCOUNTER — Telehealth: Payer: Self-pay | Admitting: Hematology and Oncology

## 2021-04-15 NOTE — Telephone Encounter (Signed)
Received a new hem referral from Dr. Candiss Norse for m-spike, kappa light chain. Jackie Wade returned my call and has been scheduled to see Dr. Chryl Heck on 6/1 at 11am. Pt aware to arrive 20 minutes early.

## 2021-04-27 ENCOUNTER — Inpatient Hospital Stay: Payer: Medicare Other | Attending: Hematology and Oncology | Admitting: Hematology and Oncology

## 2021-04-27 ENCOUNTER — Inpatient Hospital Stay: Payer: Medicare Other

## 2021-04-27 ENCOUNTER — Telehealth: Payer: Self-pay

## 2021-04-27 DIAGNOSIS — N189 Chronic kidney disease, unspecified: Secondary | ICD-10-CM | POA: Diagnosis not present

## 2021-04-27 DIAGNOSIS — E1169 Type 2 diabetes mellitus with other specified complication: Secondary | ICD-10-CM | POA: Diagnosis not present

## 2021-04-27 DIAGNOSIS — H9319 Tinnitus, unspecified ear: Secondary | ICD-10-CM | POA: Diagnosis not present

## 2021-04-27 NOTE — Telephone Encounter (Signed)
Attempted to call patient regarding missed appt today, 6/1. No answer. Voicemail box full. Scheduling message sent to reschedule appt.

## 2021-05-09 LAB — SURGICAL PATHOLOGY

## 2021-05-10 ENCOUNTER — Encounter (HOSPITAL_COMMUNITY): Payer: Self-pay

## 2021-05-11 DIAGNOSIS — N179 Acute kidney failure, unspecified: Secondary | ICD-10-CM | POA: Diagnosis not present

## 2021-05-11 DIAGNOSIS — N2581 Secondary hyperparathyroidism of renal origin: Secondary | ICD-10-CM | POA: Diagnosis not present

## 2021-05-11 DIAGNOSIS — N184 Chronic kidney disease, stage 4 (severe): Secondary | ICD-10-CM | POA: Diagnosis not present

## 2021-05-11 DIAGNOSIS — D631 Anemia in chronic kidney disease: Secondary | ICD-10-CM | POA: Diagnosis not present

## 2021-05-11 DIAGNOSIS — R609 Edema, unspecified: Secondary | ICD-10-CM | POA: Diagnosis not present

## 2021-05-11 DIAGNOSIS — R809 Proteinuria, unspecified: Secondary | ICD-10-CM | POA: Diagnosis not present

## 2021-05-11 DIAGNOSIS — N189 Chronic kidney disease, unspecified: Secondary | ICD-10-CM | POA: Diagnosis not present

## 2021-05-11 DIAGNOSIS — D472 Monoclonal gammopathy: Secondary | ICD-10-CM | POA: Diagnosis not present

## 2021-05-11 DIAGNOSIS — I129 Hypertensive chronic kidney disease with stage 1 through stage 4 chronic kidney disease, or unspecified chronic kidney disease: Secondary | ICD-10-CM | POA: Diagnosis not present

## 2021-05-11 DIAGNOSIS — N032 Chronic nephritic syndrome with diffuse membranous glomerulonephritis: Secondary | ICD-10-CM | POA: Diagnosis not present

## 2021-05-17 DIAGNOSIS — J309 Allergic rhinitis, unspecified: Secondary | ICD-10-CM | POA: Insufficient documentation

## 2021-05-17 DIAGNOSIS — R0981 Nasal congestion: Secondary | ICD-10-CM | POA: Diagnosis not present

## 2021-05-17 DIAGNOSIS — H6123 Impacted cerumen, bilateral: Secondary | ICD-10-CM | POA: Insufficient documentation

## 2021-05-17 DIAGNOSIS — R0989 Other specified symptoms and signs involving the circulatory and respiratory systems: Secondary | ICD-10-CM | POA: Diagnosis not present

## 2021-05-17 DIAGNOSIS — Z8616 Personal history of COVID-19: Secondary | ICD-10-CM | POA: Diagnosis not present

## 2021-05-18 ENCOUNTER — Other Ambulatory Visit: Payer: Self-pay | Admitting: Cardiology

## 2021-05-26 DIAGNOSIS — N184 Chronic kidney disease, stage 4 (severe): Secondary | ICD-10-CM | POA: Diagnosis not present

## 2021-06-07 DIAGNOSIS — N2581 Secondary hyperparathyroidism of renal origin: Secondary | ICD-10-CM | POA: Diagnosis not present

## 2021-06-07 DIAGNOSIS — N185 Chronic kidney disease, stage 5: Secondary | ICD-10-CM | POA: Diagnosis not present

## 2021-06-07 DIAGNOSIS — R809 Proteinuria, unspecified: Secondary | ICD-10-CM | POA: Diagnosis not present

## 2021-06-07 DIAGNOSIS — I120 Hypertensive chronic kidney disease with stage 5 chronic kidney disease or end stage renal disease: Secondary | ICD-10-CM | POA: Diagnosis not present

## 2021-06-07 DIAGNOSIS — D631 Anemia in chronic kidney disease: Secondary | ICD-10-CM | POA: Diagnosis not present

## 2021-06-07 DIAGNOSIS — N179 Acute kidney failure, unspecified: Secondary | ICD-10-CM | POA: Diagnosis not present

## 2021-06-07 DIAGNOSIS — N189 Chronic kidney disease, unspecified: Secondary | ICD-10-CM | POA: Diagnosis not present

## 2021-06-07 DIAGNOSIS — N032 Chronic nephritic syndrome with diffuse membranous glomerulonephritis: Secondary | ICD-10-CM | POA: Diagnosis not present

## 2021-06-07 DIAGNOSIS — N184 Chronic kidney disease, stage 4 (severe): Secondary | ICD-10-CM | POA: Diagnosis not present

## 2021-06-07 DIAGNOSIS — D472 Monoclonal gammopathy: Secondary | ICD-10-CM | POA: Diagnosis not present

## 2021-06-09 ENCOUNTER — Encounter: Payer: Self-pay | Admitting: Emergency Medicine

## 2021-06-09 ENCOUNTER — Inpatient Hospital Stay (HOSPITAL_COMMUNITY)
Admission: EM | Admit: 2021-06-09 | Discharge: 2021-06-11 | DRG: 638 | Disposition: A | Discharge status: Home / Self Care | Payer: Medicare Other | Attending: Internal Medicine | Admitting: Internal Medicine

## 2021-06-09 ENCOUNTER — Other Ambulatory Visit: Payer: Self-pay

## 2021-06-09 ENCOUNTER — Encounter (HOSPITAL_COMMUNITY): Payer: Self-pay | Admitting: Emergency Medicine

## 2021-06-09 DIAGNOSIS — R739 Hyperglycemia, unspecified: Secondary | ICD-10-CM | POA: Diagnosis present

## 2021-06-09 DIAGNOSIS — D631 Anemia in chronic kidney disease: Secondary | ICD-10-CM | POA: Diagnosis not present

## 2021-06-09 DIAGNOSIS — N051 Unspecified nephritic syndrome with focal and segmental glomerular lesions: Secondary | ICD-10-CM | POA: Diagnosis present

## 2021-06-09 DIAGNOSIS — Z7984 Long term (current) use of oral hypoglycemic drugs: Secondary | ICD-10-CM

## 2021-06-09 DIAGNOSIS — E860 Dehydration: Secondary | ICD-10-CM | POA: Diagnosis not present

## 2021-06-09 DIAGNOSIS — E1169 Type 2 diabetes mellitus with other specified complication: Secondary | ICD-10-CM | POA: Diagnosis not present

## 2021-06-09 DIAGNOSIS — T380X5A Adverse effect of glucocorticoids and synthetic analogues, initial encounter: Secondary | ICD-10-CM | POA: Diagnosis present

## 2021-06-09 DIAGNOSIS — I2510 Atherosclerotic heart disease of native coronary artery without angina pectoris: Secondary | ICD-10-CM | POA: Diagnosis present

## 2021-06-09 DIAGNOSIS — I10 Essential (primary) hypertension: Secondary | ICD-10-CM | POA: Diagnosis not present

## 2021-06-09 DIAGNOSIS — Z8616 Personal history of COVID-19: Secondary | ICD-10-CM

## 2021-06-09 DIAGNOSIS — Z23 Encounter for immunization: Secondary | ICD-10-CM

## 2021-06-09 DIAGNOSIS — E871 Hypo-osmolality and hyponatremia: Secondary | ICD-10-CM

## 2021-06-09 DIAGNOSIS — E785 Hyperlipidemia, unspecified: Secondary | ICD-10-CM | POA: Diagnosis not present

## 2021-06-09 DIAGNOSIS — E1122 Type 2 diabetes mellitus with diabetic chronic kidney disease: Secondary | ICD-10-CM | POA: Diagnosis not present

## 2021-06-09 DIAGNOSIS — N184 Chronic kidney disease, stage 4 (severe): Secondary | ICD-10-CM | POA: Diagnosis not present

## 2021-06-09 DIAGNOSIS — N289 Disorder of kidney and ureter, unspecified: Secondary | ICD-10-CM | POA: Insufficient documentation

## 2021-06-09 DIAGNOSIS — E876 Hypokalemia: Secondary | ICD-10-CM | POA: Diagnosis present

## 2021-06-09 DIAGNOSIS — N186 End stage renal disease: Secondary | ICD-10-CM | POA: Diagnosis not present

## 2021-06-09 DIAGNOSIS — I129 Hypertensive chronic kidney disease with stage 1 through stage 4 chronic kidney disease, or unspecified chronic kidney disease: Secondary | ICD-10-CM | POA: Diagnosis present

## 2021-06-09 DIAGNOSIS — N189 Chronic kidney disease, unspecified: Secondary | ICD-10-CM | POA: Diagnosis present

## 2021-06-09 DIAGNOSIS — E1165 Type 2 diabetes mellitus with hyperglycemia: Principal | ICD-10-CM | POA: Insufficient documentation

## 2021-06-09 DIAGNOSIS — Z79899 Other long term (current) drug therapy: Secondary | ICD-10-CM | POA: Diagnosis not present

## 2021-06-09 DIAGNOSIS — R7989 Other specified abnormal findings of blood chemistry: Secondary | ICD-10-CM | POA: Diagnosis present

## 2021-06-09 DIAGNOSIS — N179 Acute kidney failure, unspecified: Secondary | ICD-10-CM | POA: Diagnosis not present

## 2021-06-09 DIAGNOSIS — E119 Type 2 diabetes mellitus without complications: Secondary | ICD-10-CM

## 2021-06-09 DIAGNOSIS — I7389 Other specified peripheral vascular diseases: Secondary | ICD-10-CM | POA: Diagnosis present

## 2021-06-09 DIAGNOSIS — J449 Chronic obstructive pulmonary disease, unspecified: Secondary | ICD-10-CM | POA: Diagnosis not present

## 2021-06-09 DIAGNOSIS — E1100 Type 2 diabetes mellitus with hyperosmolarity without nonketotic hyperglycemic-hyperosmolar coma (NKHHC): Secondary | ICD-10-CM | POA: Diagnosis present

## 2021-06-09 HISTORY — DX: Disorder of kidney and ureter, unspecified: N28.9

## 2021-06-09 HISTORY — DX: Chronic kidney disease, stage 4 (severe): N18.4

## 2021-06-09 LAB — BASIC METABOLIC PANEL
Anion gap: 14 (ref 5–15)
Anion gap: 13 (ref 5–15)
BUN: 99 mg/dL — ABNORMAL HIGH (ref 8–23)
BUN: 94 mg/dL — ABNORMAL HIGH (ref 8–23)
CO2: 19 mmol/L — ABNORMAL LOW (ref 22–32)
CO2: 19 mmol/L — ABNORMAL LOW (ref 22–32)
Calcium: 8.5 mg/dL — ABNORMAL LOW (ref 8.9–10.3)
Calcium: 8.6 mg/dL — ABNORMAL LOW (ref 8.9–10.3)
Chloride: 87 mmol/L — ABNORMAL LOW (ref 98–111)
Chloride: 90 mmol/L — ABNORMAL LOW (ref 98–111)
Creatinine, Ser: 4.87 mg/dL — ABNORMAL HIGH (ref 0.44–1.00)
Creatinine, Ser: 4.82 mg/dL — ABNORMAL HIGH (ref 0.44–1.00)
GFR, Estimated: 9 mL/min — ABNORMAL LOW (ref >60)
GFR, Estimated: 9 mL/min — ABNORMAL LOW (ref >60)
Glucose, Bld: 873 mg/dL (ref 70–99)
Glucose, Bld: 750 mg/dL (ref 70–99)
Potassium: 4.1 mmol/L (ref 3.5–5.1)
Potassium: 3.9 mmol/L (ref 3.5–5.1)
Sodium: 120 mmol/L — ABNORMAL LOW (ref 135–145)
Sodium: 122 mmol/L — ABNORMAL LOW (ref 135–145)

## 2021-06-09 LAB — CBC WITH DIFFERENTIAL/PLATELET
Abs Immature Granulocytes: 0.04 10*3/uL (ref 0.00–0.07)
Basophils Absolute: 0 10*3/uL (ref 0.0–0.1)
Basophils Relative: 0 %
Eosinophils Absolute: 0 10*3/uL (ref 0.0–0.5)
Eosinophils Relative: 0 %
HCT: 26.8 % — ABNORMAL LOW (ref 36.0–46.0)
Hemoglobin: 9.3 g/dL — ABNORMAL LOW (ref 12.0–15.0)
Immature Granulocytes: 1 %
Lymphocytes Relative: 5 %
Lymphs Abs: 0.3 10*3/uL — ABNORMAL LOW (ref 0.7–4.0)
MCH: 29.2 pg (ref 26.0–34.0)
MCHC: 34.7 g/dL (ref 30.0–36.0)
MCV: 84 fL (ref 80.0–100.0)
Monocytes Absolute: 0.1 10*3/uL (ref 0.1–1.0)
Monocytes Relative: 2 %
Neutro Abs: 5.6 10*3/uL (ref 1.7–7.7)
Neutrophils Relative %: 92 %
Platelets: 200 10*3/uL (ref 150–400)
RBC: 3.19 MIL/uL — ABNORMAL LOW (ref 3.87–5.11)
RDW: 11.7 % (ref 11.5–15.5)
WBC: 6 10*3/uL (ref 4.0–10.5)
nRBC: 0 % (ref 0.0–0.2)

## 2021-06-09 LAB — IRON AND TIBC
Iron: 122 ug/dL (ref 28–170)
Saturation Ratios: 52 % — ABNORMAL HIGH (ref 10.4–31.8)
TIBC: 232 ug/dL — ABNORMAL LOW (ref 250–450)
UIBC: 110 ug/dL

## 2021-06-09 LAB — COMPREHENSIVE METABOLIC PANEL
ALT: 40 U/L (ref 0–44)
AST: 35 U/L (ref 15–41)
Albumin: 3.5 g/dL (ref 3.5–5.0)
Alkaline Phosphatase: 127 U/L — ABNORMAL HIGH (ref 38–126)
Anion gap: 13 (ref 5–15)
BUN: 99 mg/dL — ABNORMAL HIGH (ref 8–23)
CO2: 21 mmol/L — ABNORMAL LOW (ref 22–32)
Calcium: 8.9 mg/dL (ref 8.9–10.3)
Chloride: 85 mmol/L — ABNORMAL LOW (ref 98–111)
Creatinine, Ser: 5.02 mg/dL — ABNORMAL HIGH (ref 0.44–1.00)
GFR, Estimated: 9 mL/min — ABNORMAL LOW (ref >60)
Glucose, Bld: 865 mg/dL (ref 70–99)
Potassium: 4 mmol/L (ref 3.5–5.1)
Sodium: 119 mmol/L — CL (ref 135–145)
Total Bilirubin: 0.8 mg/dL (ref 0.3–1.2)
Total Protein: 6.6 g/dL (ref 6.5–8.1)

## 2021-06-09 LAB — URINALYSIS, MICROSCOPIC (REFLEX): Squamous Epithelial / HPF: NONE SEEN (ref 0–5)

## 2021-06-09 LAB — RETICULOCYTES
Immature Retic Fract: 1.6 % — ABNORMAL LOW (ref 2.3–15.9)
RBC.: 2.97 MIL/uL — ABNORMAL LOW (ref 3.87–5.11)
Retic Count, Absolute: 21.4 10*3/uL (ref 19.0–186.0)
Retic Ct Pct: 0.7 % (ref 0.4–3.1)

## 2021-06-09 LAB — I-STAT VENOUS BLOOD GAS, ED
Acid-base deficit: 2 mmol/L (ref 0.0–2.0)
Bicarbonate: 22.8 mmol/L (ref 20.0–28.0)
Calcium, Ion: 1.18 mmol/L (ref 1.15–1.40)
HCT: 28 % — ABNORMAL LOW (ref 36.0–46.0)
Hemoglobin: 9.5 g/dL — ABNORMAL LOW (ref 12.0–15.0)
O2 Saturation: 84 %
Potassium: 4.2 mmol/L (ref 3.5–5.1)
Sodium: 119 mmol/L — CL (ref 135–145)
TCO2: 24 mmol/L (ref 22–32)
pCO2, Ven: 38.4 mmHg — ABNORMAL LOW (ref 44.0–60.0)
pH, Ven: 7.381 (ref 7.250–7.430)
pO2, Ven: 50 mmHg — ABNORMAL HIGH (ref 32.0–45.0)

## 2021-06-09 LAB — URINALYSIS, ROUTINE W REFLEX MICROSCOPIC
Bilirubin Urine: NEGATIVE
Glucose, UA: >=500 mg/dL — AB
Ketones, ur: NEGATIVE mg/dL
Leukocytes,Ua: NEGATIVE
Nitrite: NEGATIVE
Protein, ur: 100 mg/dL — AB
Specific Gravity, Urine: 1.015 (ref 1.005–1.030)
pH: 5.5 (ref 5.0–8.0)

## 2021-06-09 LAB — CBG MONITORING, ED
Glucose-Capillary: >600 mg/dL (ref 70–99)
Glucose-Capillary: >600 mg/dL (ref 70–99)
Glucose-Capillary: 544 mg/dL (ref 70–99)

## 2021-06-09 LAB — GLUCOSE, CAPILLARY
Glucose-Capillary: 422 mg/dL — ABNORMAL HIGH (ref 70–99)
Glucose-Capillary: 451 mg/dL — ABNORMAL HIGH (ref 70–99)
Glucose-Capillary: 480 mg/dL — ABNORMAL HIGH (ref 70–99)

## 2021-06-09 LAB — RESP PANEL BY RT-PCR (FLU A&B, COVID) ARPGX2
Influenza A by PCR: NEGATIVE
Influenza B by PCR: NEGATIVE
SARS Coronavirus 2 by RT PCR: NEGATIVE

## 2021-06-09 LAB — OSMOLALITY: Osmolality: 337 mOsm/kg (ref 275–295)

## 2021-06-09 LAB — BETA-HYDROXYBUTYRIC ACID: Beta-Hydroxybutyric Acid: 0.52 mmol/L — ABNORMAL HIGH (ref 0.05–0.27)

## 2021-06-09 LAB — FERRITIN: Ferritin: 830 ng/mL — ABNORMAL HIGH (ref 11–307)

## 2021-06-09 MED ORDER — AMLODIPINE BESYLATE 10 MG PO TABS
10.0000 mg | ORAL_TABLET | Freq: Every day | ORAL | Status: DC
  Administered 2021-06-10 – 2021-06-11 (×2): 10 mg via ORAL
  Filled 2021-06-09 (×2): qty 1

## 2021-06-09 MED ORDER — POTASSIUM CHLORIDE 10 MEQ/100ML IV SOLN
10.0000 meq | Freq: Once | INTRAVENOUS | Status: AC
  Administered 2021-06-09: 10 meq via INTRAVENOUS
  Filled 2021-06-09: qty 100

## 2021-06-09 MED ORDER — DEXTROSE 50 % IV SOLN: 0.0000 mL | INTRAVENOUS | Status: DC | PRN

## 2021-06-09 MED ORDER — DEXTROSE IN LACTATED RINGERS 5 % IV SOLN: INTRAVENOUS | Status: DC

## 2021-06-09 MED ORDER — LACTATED RINGERS IV SOLN: INTRAVENOUS | Status: DC

## 2021-06-09 MED ORDER — LABETALOL HCL 5 MG/ML IV SOLN: 10.0000 mg | INTRAVENOUS | Status: DC | PRN

## 2021-06-09 MED ORDER — PNEUMOCOCCAL VAC POLYVALENT 25 MCG/0.5ML IJ INJ
0.5000 mL | INJECTION | INTRAMUSCULAR | Status: AC
  Administered 2021-06-11: 0.5 mL via INTRAMUSCULAR
  Filled 2021-06-09 (×2): qty 0.5

## 2021-06-09 MED ORDER — HYDRALAZINE HCL 20 MG/ML IJ SOLN
10.0000 mg | Freq: Once | INTRAMUSCULAR | Status: AC
  Administered 2021-06-09: 10 mg via INTRAVENOUS
  Filled 2021-06-09: qty 1

## 2021-06-09 MED ORDER — SODIUM CHLORIDE 0.9 % IV BOLUS
20.0000 mL/kg | Freq: Once | INTRAVENOUS | Status: AC
  Administered 2021-06-09: 1000 mL via INTRAVENOUS

## 2021-06-09 MED ORDER — HEPARIN SODIUM (PORCINE) 5000 UNIT/ML IJ SOLN
5000.0000 [IU] | Freq: Two times a day (BID) | INTRAMUSCULAR | Status: DC
  Administered 2021-06-09 – 2021-06-11 (×4): 5000 [IU] via SUBCUTANEOUS
  Filled 2021-06-09 (×4): qty 1

## 2021-06-09 MED ORDER — INSULIN REGULAR(HUMAN) IN NACL 100-0.9 UT/100ML-% IV SOLN
INTRAVENOUS | Status: DC
Start: 2021-06-09 — End: 2021-06-10
  Administered 2021-06-09: 7.5 [IU]/h via INTRAVENOUS
  Administered 2021-06-10: 1 [IU]/h via INTRAVENOUS
  Filled 2021-06-09 (×2): qty 100

## 2021-06-09 MED ORDER — POTASSIUM CHLORIDE 10 MEQ/100ML IV SOLN
10.0000 meq | INTRAVENOUS | Status: AC
  Administered 2021-06-09 (×2): 10 meq via INTRAVENOUS
  Filled 2021-06-09 (×2): qty 100

## 2021-06-09 MED ORDER — AMLODIPINE BESYLATE 5 MG PO TABS
5.0000 mg | ORAL_TABLET | Freq: Once | ORAL | Status: AC
  Administered 2021-06-09: 5 mg via ORAL
  Filled 2021-06-09: qty 1

## 2021-06-09 MED ORDER — TRAMADOL HCL 50 MG PO TABS
50.0000 mg | ORAL_TABLET | Freq: Four times a day (QID) | ORAL | Status: DC | PRN
Start: 2021-06-09 — End: 2021-06-11
  Filled 2021-06-09: qty 1

## 2021-06-09 MED ORDER — AMLODIPINE BESYLATE 5 MG PO TABS: 5.0000 mg | ORAL_TABLET | Freq: Every day | ORAL | Status: DC

## 2021-06-09 MED ORDER — PREDNISONE 20 MG PO TABS
40.0000 mg | ORAL_TABLET | Freq: Every day | ORAL | Status: DC
  Administered 2021-06-10: 40 mg via ORAL
  Filled 2021-06-09: qty 2

## 2021-06-09 MED ORDER — AMLODIPINE BESYLATE 5 MG PO TABS: 5.0000 mg | ORAL_TABLET | Freq: Two times a day (BID) | ORAL | Status: DC

## 2021-06-09 NOTE — ED Triage Notes (Signed)
Patient sent to ED by PCP for evaluation of hyperglycemia, serum glucose 1093 on July 12. Patient also requesting evaluation of possible cerumen impaction in left ear that started three weeks ago. Patient alert, oriented, and in no apparent distress at this time.

## 2021-06-09 NOTE — ED Notes (Signed)
RN walked pt to bathroom  

## 2021-06-09 NOTE — ED Notes (Signed)
Went into the room to try to hook the patient up to the monitor patient refused

## 2021-06-09 NOTE — ED Provider Notes (Signed)
Emergency Medicine Provider Triage Evaluation Note  Jackie Wade , a 74 y.o. female  was evaluated in triage.  Pt complains of hyperglycemia. Patient was seen at PCP on 7/12 where her glucose was 1040. She admits to feeling lethargic yesterday which improved today. She was seen at her PCP office today and was advised to report to the ED for further evaluation of hyperglycemia. Reading is the office was "too high to read". Denies abdominal pain, nausea, and vomiting. No fever or chills. No confusion. She states on 7/12 she was instructed to stop her Metformin and only take her Januvia which she has been compliant with.   She is also concerned about hearing loss to left ear. She has her ears cleaned by ENT 3 weeks ago and was told her hearing should improve after; however it has not.   Review of Systems  Positive: Activity changes, hearing changes Negative: N, V, abdominal pain  Physical Exam  BP (!) 172/74 (BP Location: Left Arm)   Pulse 76   Temp 97.7 F (36.5 C)   Resp 15   SpO2 100%  Gen:   Awake, no distress   Resp:  Normal effort  MSK:   Moves extremities without difficulty  Other:  Abdomen soft, nondistended, nontender to palpation in all quadrants without guarding or peritoneal signs. No rebound.    Medical Decision Making  Medically screening exam initiated at 1:28 PM.  Appropriate orders placed.  Jackie Wade was informed that the remainder of the evaluation will be completed by another provider, this initial triage assessment does not replace that evaluation, and the importance of remaining in the ED until their evaluation is complete.  Hyperglycemia labs ordered. Normal mentation. Patient non-toxic appearing. CBG >600 in triage.    Jackie Bouchard, PA-C 06/09/21 1331    Jackie Gibbs, DO 06/09/21 1341

## 2021-06-09 NOTE — ED Provider Notes (Signed)
[facility] EMERGENCY DEPARTMENT Provider Note   CSN: [id number] Arrival date & time: 06/09/21  1230     History Chief Complaint  Patient presents with   Hyperglycemia    Jackie Wade is a 75 y.o. female.   Hyperglycemia  This patient is a 75 year old female, she unfortunately has been diagnosed with diabetes, she has essential hypertension, she takes medications including amlodipine, hydroxyzine, Lozol, Januvia, metformin and rosuvastatin.  This patient presents to the hospital today with a complaint of hyperglycemia.  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  She was contacted by phone by her kidney doctor who told her that she needed to check her blood sugar and come to the hospital because her blood sugar had measured over 1000.  The patient reports that over the last 6 months since she had COVID in January 2022 she has progressively worsened and actually underwent a kidney biopsy because of severely aggressively worsening kidney problems, she was placed on steroids which she is currently taking as a taper.  At this time the patient denies fevers chills nausea vomiting or diarrhea, she has no coughing shortness of breath or chest pain, she does have a mild headache, her vision is normal.  According to what I can gather from the patient's electronic medical record she had been referred to oncology by Dr. Candiss Norse of the nephrology service because of having an M spike kappa light chain.  She has not seen them.  Scouring the medical record I do not see any significant recent labs since 2021.  Past Medical History:  Diagnosis Date   Chronic kidney disease (CKD), stage IV (severe) (HCC)    Diabetes mellitus without complication  ([place])    Renal disorder     Patient Active Problem List   Diagnosis Date Noted   Renal disorder 06/09/2021   Type 2 diabetes mellitus without complication, without long-term current use of insulin ([place]) 11/12/2020   Essential hypertension 11/12/2020    Past Surgical History:  Procedure Laterality Date   WRIST SURGERY       OB History   No obstetric history on file.     No family history on file.  Social History   Tobacco Use   Smoking status: Never   Smokeless tobacco: Never  Substance Use Topics   Alcohol use: No   Drug use: No    Home Medications Prior to Admission medications   Medication Sig Start Date End Date Taking? Authorizing Provider  amLODipine (NORVASC) 5 MG tablet Take 5 mg by mouth daily.   Yes [provider]  furosemide (LASIX) 40 MG tablet Take 40-80 mg by mouth See admin instructions. 80 mg in the morning, and 40 mg at 4p   Yes [provider]  indapamide (LOZOL) 1.25 MG tablet Take 1.25 mg by mouth every morning. 10/19/20  Yes [provider]  JANUVIA 100 MG tablet Take 100 mg by mouth daily. 07/08/20  Yes [provider]  metFORMIN (GLUCOPHAGE) 500 MG tablet Take 500 mg by mouth daily with breakfast.   Yes [provider]  rosuvastatin (CRESTOR) 10 MG tablet TAKE 1 AND 1/2 TABLETS BY MOUTH DAILY Patient taking differently: Take 15 mg by mouth daily. 05/18/21  Yes Buford Dresser, MD  Allergies    Patient has no known allergies.  Review of Systems   Review of Systems  All other systems reviewed and are negative.  Physical Exam Updated Vital Signs BP (!) 194/95 (BP Location: Left Arm)   Pulse 78   Temp 97.7 F (36.5 C)   Resp 13   Ht 1.6 m (5\' 3" )   Wt 60.3 kg   SpO2 100%   BMI 23.55 kg/m   Physical Exam Vitals and nursing note reviewed.  Constitutional:      General: She is not in acute distress.    Appearance: She is well-developed.  HENT:     Head: Normocephalic and  atraumatic.     Mouth/Throat:     Pharynx: No oropharyngeal exudate.  Eyes:     General: No scleral icterus.       Right eye: No discharge.        Left eye: No discharge.     Conjunctiva/sclera: Conjunctivae normal.     Pupils: Pupils are equal, round, and reactive to light.  Neck:     Thyroid: No thyromegaly.     Vascular: No JVD.  Cardiovascular:     Rate and Rhythm: Normal rate and regular rhythm.     Heart sounds: Normal heart sounds. No murmur heard.   No friction rub. No gallop.  Pulmonary:     Effort: Pulmonary effort is normal. No respiratory distress.     Breath sounds: Normal breath sounds. No wheezing or rales.  Abdominal:     General: Bowel sounds are normal. There is no distension.     Palpations: Abdomen is soft. There is no mass.     Tenderness: There is no abdominal tenderness.  Musculoskeletal:        General: No tenderness. Normal range of motion.     Cervical back: Normal range of motion and neck supple.  Lymphadenopathy:     Cervical: No cervical adenopathy.  Skin:    General: Skin is warm and dry.     Findings: No erythema or rash.  Neurological:     Mental Status: She is alert.     Coordination: Coordination normal.  Psychiatric:        Behavior: Behavior normal.    ED Results / Procedures / Treatments   Labs (all labs ordered are listed, but only abnormal results are displayed) Labs Reviewed  CBC WITH DIFFERENTIAL/PLATELET - Abnormal; Notable for the following components:      Result Value   RBC 3.19 (*)    Hemoglobin 9.3 (*)    HCT 26.8 (*)    Lymphs Abs 0.3 (*)    All other components within normal limits  COMPREHENSIVE METABOLIC PANEL - Abnormal; Notable for the following components:   Sodium 119 (*)    Chloride 85 (*)    CO2 21 (*)    Glucose, Bld 865 (*)    BUN 99 (*)    Creatinine, Ser 5.02 (*)    Alkaline Phosphatase 127 (*)    GFR, Estimated 9 (*)    All other components within normal limits  BETA-HYDROXYBUTYRIC ACID -  Abnormal; Notable for the following components:   Beta-Hydroxybutyric Acid 0.52 (*)    All other components within normal limits  URINALYSIS, ROUTINE W REFLEX MICROSCOPIC - Abnormal; Notable for the following components:   Glucose, UA >=500 (*)    Hgb urine dipstick MODERATE (*)    Protein, ur 100 (*)    All other components within normal limits  URINALYSIS,  MICROSCOPIC (REFLEX) - Abnormal; Notable for the following components:   Bacteria, UA RARE (*)    All other components within normal limits  CBG MONITORING, ED - Abnormal; Notable for the following components:   Glucose-Capillary >600 (*)    All other components within normal limits  I-STAT VENOUS BLOOD GAS, ED - Abnormal; Notable for the following components:   pCO2, Ven 38.4 (*)    pO2, Ven 50.0 (*)    Sodium 119 (*)    HCT 28.0 (*)    Hemoglobin 9.5 (*)    All other components within normal limits  OSMOLALITY  CBG MONITORING, ED    EKG EKG Interpretation  Date/Time:  Thursday June 09 2021 15:01:40 EDT Ventricular Rate:  73 PR Interval:  140 QRS Duration: 104 QT Interval:  458 QTC Calculation: 505 R Axis:   -6 Text Interpretation: Sinus rhythm Probable left atrial enlargement Left ventricular hypertrophy Prolonged QT interval LVH now present Confirmed by Noemi Chapel [phone]) on 06/09/2021 3:07:42 PM  Radiology No results found.  Procedures .Critical Care  Date/Time: 06/09/2021 3:27 PM Performed by: Noemi Chapel, MD Authorized by: Noemi Chapel, MD   Critical care provider statement:    Critical care time (minutes):  35   Critical care time was exclusive of:  Separately billable procedures and treating other patients and teaching time   Critical care was necessary to treat or prevent imminent or life-threatening deterioration of the following conditions:  Renal failure and endocrine crisis   Critical care was time spent personally by me on the following activities:  Blood draw for specimens, development of  treatment plan with patient or surrogate, discussions with consultants, evaluation of patient's response to treatment, examination of patient, obtaining history from patient or surrogate, ordering and performing treatments and interventions, ordering and review of laboratory studies, ordering and review of radiographic studies, pulse oximetry, re-evaluation of patient's condition and review of old charts   Medications Ordered in ED Medications  insulin regular, human (MYXREDLIN) 100 units/ 100 mL infusion (has no administration in time range)  lactated ringers infusion (has no administration in time range)  dextrose 5 % in lactated ringers infusion (has no administration in time range)  dextrose 50 % solution 0-50 mL (has no administration in time range)  potassium chloride 10 mEq in 100 mL IVPB (10 mEq Intravenous New Bag/Given 06/09/21 1524)  hydrALAZINE (APRESOLINE) injection 10 mg (has no administration in time range)  sodium chloride 0.9 % bolus 1,206 mL (1,000 mLs Intravenous New Bag/Given 06/09/21 1521)    ED Course  I have reviewed the triage vital signs and the nursing notes.  Pertinent labs & imaging results that were available during my care of the patient were reviewed by me and considered in my medical decision making (see chart for details).    MDM Rules/Calculators/A&P                          The patient's apparently developed worsening hypertension, worsening renal failure, worsening hyperglycemia and appears decompensated with regards to a metabolic standpoint.  That being said she is alert and oriented, she is hypertensive at 253 systolic but she has not tachycardic, she is not febrile, her oxygen is normal and her exam is rather unremarkable.  Most likely the patient is severely dehydrated and will need IV fluids.  I am concerned given the balance between her renal failure, I do not yet have her labs back but we will start some  IV fluids before we start insulin.  She is not  tachypneic, her mental status is normal.  I suspect she will need to be admitted to the hospital given the gravity of the elevation of her hyperglycemia  This patient's hyperglycemia is quite severe over 088, the metabolic panel shows that she has a pseudohyponatremia, in fact it shows a potassium of 4.0 a BUN of 99 and a creatinine of 5.0 and a CO2 of 21.  She does have beta hydroxybutyric acid in her serum suggestive of mild DKA, she has no ketones in her urine but does have protein.  Her pH seems to be in a normal range on her VBG.  I did discuss the case with Dr. Candiss Norse by the phone, he wants her to be admitted to the hospital, taken off of steroids and hydrated.  I discussed the case with the hospitalist Dr. Roosevelt Locks who has been kind enough to admit this very sick patient to the hospital.  Critical care provided for severe hyperglycemia and dehydration with kidney failure  Final Clinical Impression(s) / ED Diagnoses Final diagnoses:  Severe hyperglycemia due to diabetes mellitus ([place])  Hyponatremia  AKI (acute kidney injury) ([place])     Noemi Chapel, MD 06/09/21 1527

## 2021-06-09 NOTE — H&P (Addendum)
History and Physical    Jackie Wade YCX:[id number] DOB: 14-Dec-1945 DOA: 06/09/2021  PCP: Lucianne Lei, MD (Confirm with patient/family/NH records and if not entered, this has to be entered at [facility] point of entry) Patient coming from: Home  I have personally briefly reviewed patient's old medical records in [place]  Chief Complaint: Feeling tired, thirsty, polyuria  HPI: Jackie Wade is a 75 y.o. female with medical history significant of IIDM, CKD stage IV, HTN, HLD, presented with persistent thirsty, and polyuria.  Patient has been following with nephrology this year for worsening of her kidney function.  She underwent biopsy in May 2022, which showed diabetic glomerule sclerosis.  And patient was started on steroids 3 weeks ago and has been on tapering doses.  For her diabetes, patient takes metformin and Januvia, and she uses continuous glucose monitoring, but she happened to run out of supplies and were not able to check her glucose level at home during the last 3 weeks.  Starting about 7 days ago, increasingly she has been feeling tired, thirsty all the time and then significant increase of urination.  Her most recent creatinine level 3.8, week, when she was at nephrology hospitalist her glucose level was 600.  2 days ago, she went back to see nephrology and had blood work, and after that, nephrology discontinued her metformin.  And she has been on Januvia alone for last 2 days.  Today, patient went to see her PCP, who checked her sugar was 1093 and sent her to ED.  Since last week, she has been on 40 mg prednisone daily.  ED Course: Glucose 865, bicarb 21, creatinine 5.0, sodium 119.  Blood pressure significantly elevated, 1 dose of labetalol given.  Patient was given normal saline bolus 1200 mL, insulin drip then started.  Review of Systems: As per HPI otherwise 14 point review of systems negative.    Past Medical History:  Diagnosis Date   Chronic kidney disease  (CKD), stage IV (severe) (HCC)    Diabetes mellitus without complication ([place])    Renal disorder     Past Surgical History:  Procedure Laterality Date   WRIST SURGERY       reports that she has never smoked. She has never used smokeless tobacco. She reports that she does not drink alcohol and does not use drugs.  No Known Allergies  No family history on file.   Prior to Admission medications   Medication Sig Start Date End Date Taking? Authorizing Provider  amLODipine (NORVASC) 5 MG tablet Take 5 mg by mouth daily.   Yes [provider]  furosemide (LASIX) 40 MG tablet Take 40-80 mg by mouth See admin instructions. 80 mg in the morning, and 40 mg at 4p   Yes [provider]  indapamide (LOZOL) 1.25 MG tablet Take 1.25 mg by mouth every morning. 10/19/20  Yes [provider]  JANUVIA 100 MG tablet Take 100 mg by mouth daily. 07/08/20  Yes [provider]  metFORMIN (GLUCOPHAGE) 500 MG tablet Take 500 mg by mouth daily with breakfast.   Yes [provider]  rosuvastatin (CRESTOR) 10 MG tablet TAKE 1 AND 1/2 TABLETS BY MOUTH DAILY Patient taking differently: Take 15 mg by mouth daily. 05/18/21  Yes Buford Dresser, MD    Physical Exam: Vitals:   06/09/21 1246 06/09/21 1422 06/09/21 1422  BP: (!) 172/74  (!) 194/95  Pulse: 76  78  Resp: 15  13  Temp: 97.7 F (36.5 C)  SpO2: 100%  100%  Weight:  60.3 kg   Height:  5\' 3"  (1.6 m)     Constitutional: NAD, calm, comfortable Vitals:   06/09/21 1246 06/09/21 1422 06/09/21 1422  BP: (!) 172/74  (!) 194/95  Pulse: 76  78  Resp: 15  13  Temp: 97.7 F (36.5 C)    SpO2: 100%  100%  Weight:  60.3 kg   Height:  5\' 3"  (1.6 m)    Eyes: PERRL, lids and conjunctivae normal ENMT: Mucous membranes are moist. Posterior pharynx clear of any exudate or lesions.Normal dentition.  Neck: normal, supple, no masses, no thyromegaly Respiratory: clear to auscultation bilaterally, no wheezing,  no crackles. Normal respiratory effort. No accessory muscle use.  Cardiovascular: Regular rate and rhythm, no murmurs / rubs / gallops. No extremity edema. 2+ pedal pulses. No carotid bruits.  Abdomen: no tenderness, no masses palpated. No hepatosplenomegaly. Bowel sounds positive.  Musculoskeletal: no clubbing / cyanosis. No joint deformity upper and lower extremities. Good ROM, no contractures. Normal muscle tone.  Skin: no rashes, lesions, ulcers. No induration Neurologic: CN 2-12 grossly intact. Sensation intact, DTR normal. Strength 5/5 in all 4.  Psychiatric: Normal judgment and insight. Alert and oriented x 3. Normal mood.     Labs on Admission: I have personally reviewed following labs and imaging studies  CBC: Recent Labs  Lab 06/09/21 1328 06/09/21 1349  WBC 6.0  --   NEUTROABS 5.6  --   HGB 9.3* 9.5*  HCT 26.8* 28.0*  MCV 84.0  --   PLT 200  --    Basic Metabolic Panel: Recent Labs  Lab 06/09/21 1328 06/09/21 1349  NA 119* 119*  K 4.0 4.2  CL 85*  --   CO2 21*  --   GLUCOSE 865*  --   BUN 99*  --   CREATININE 5.02*  --   CALCIUM 8.9  --    GFR: Estimated Creatinine Clearance: 8.3 mL/min (A) (by C-G formula based on SCr of 5.02 mg/dL (H)). Liver Function Tests: Recent Labs  Lab 06/09/21 1328  AST 35  ALT 40  ALKPHOS 127*  BILITOT 0.8  PROT 6.6  ALBUMIN 3.5   No results for input(s): LIPASE, AMYLASE in the last 168 hours. No results for input(s): AMMONIA in the last 168 hours. Coagulation Profile: No results for input(s): INR, PROTIME in the last 168 hours. Cardiac Enzymes: No results for input(s): CKTOTAL, CKMB, CKMBINDEX, TROPONINI in the last 168 hours. BNP (last 3 results) No results for input(s): PROBNP in the last 8760 hours. HbA1C: No results for input(s): HGBA1C in the last 72 hours. CBG: Recent Labs  Lab 06/09/21 1330  GLUCAP >600*   Lipid Profile: No results for input(s): CHOL, HDL, LDLCALC, TRIG, CHOLHDL, LDLDIRECT in the last 72  hours. Thyroid Function Tests: No results for input(s): TSH, T4TOTAL, FREET4, T3FREE, THYROIDAB in the last 72 hours. Anemia Panel: No results for input(s): VITAMINB12, FOLATE, FERRITIN, TIBC, IRON, RETICCTPCT in the last 72 hours. Urine analysis:    Component Value Date/Time   COLORURINE YELLOW 06/09/2021 1329   APPEARANCEUR CLEAR 06/09/2021 1329   LABSPEC 1.015 06/09/2021 1329   PHURINE 5.5 06/09/2021 1329   GLUCOSEU >=500 (A) 06/09/2021 1329   HGBUR MODERATE (A) 06/09/2021 1329   BILIRUBINUR NEGATIVE 06/09/2021 1329   KETONESUR NEGATIVE 06/09/2021 1329   PROTEINUR 100 (A) 06/09/2021 1329   NITRITE NEGATIVE 06/09/2021 1329   LEUKOCYTESUR NEGATIVE 06/09/2021 1329    Radiological Exams on Admission: No results  found.  EKG: Independently reviewed. Sinus, LVH and borderline QTC prolongation  Assessment/Plan Active Problems:   HHS (hypothenar hammer syndrome) (HCC)   Hyperosmolar hyperglycemic state (HHS) ([place])  (please populate well all problems here in Problem List. (For example, if patient is on BP meds at home and you resume or decide to hold them, it is a problem that needs to be her. Same for CAD, COPD, HLD and so on)  HHS -Secondary to steroid use -Received small normal saline bolus, given the significant elevation of blood pressure, will monitor off further IV boluses.  Continue maintenance IV fluid. -Insulin drip -Explained to patient that as well as his on steroids, likely she will need insulin coverage, likely will discharge home with insulin.  Patient expressed understanding and agreed.  AKI on CKD stage IV -Correct dehydration from HHS then reevaluate. -Likely will need more stringent BP control. -If no improvement of kidney function or worsening, plan call nephrology consult.  Nephrology was contacted in the ED, no specific recommendation but continue steroid, and long term plan is HD and kidney transplant. -Continue same dose of prednisone 40 mg  daily.  Pseudohyponatremia -Secondary to HHS, hydrate and correct glucose levels and reevaluate.  HTN uncontrolled -Increase amlodipine 5 mg BID -As needed labetalol.  HLD -Continue statin  Chronic normocytic anemia -Likely from CKD, no S/S of bleeding, and H/H at baseline, check iron study.  DVT prophylaxis: Heparin subcu Code Status: Full code Family Communication: None at bedside Disposition Plan: Expect more than 2 midnight hospital stay for insulin drip and AKI treatment. Consults called: Reconsult nephrology if needed. Admission status: PCU   Lequita Halt MD [facility] Pager [phone]  06/09/2021, 3:44 PM

## 2021-06-09 NOTE — ED Notes (Signed)
Attempted report 

## 2021-06-09 NOTE — Progress Notes (Signed)
Patient followed by me at the office. Checked in on her bedside in the ER. Case discussed w/ ER MD Dr. Sabra Heck earlier.  Has biopsy proven severe diabetic glomerulosclerosis with FSGS (w/ tip lesions), marked arteriosclerosis and marked arteriolosclerosis. FSGS w/ tip lesions are favorable for steroids however if no real response, then this typically means that she is very high risk for developing ESRD. At this junction, will need to taper her steroids off as the risks outweigh the benefits. Agree with isotonic fluids as her rise in Cr can be related to hyperglycemia. Discussed w/ Dr. Roosevelt Locks.   Of note, her BP has been relatively controlled, please stop indapamide given hyperglycemia. Can start beta blocker and/or CCB. She also has been experiencing dysgeusia.  This could be related to early uremic symptoms vs marked hyperglycemia/HHS. If kidney function does not improve and if her symptoms persist, please consult nephrology as she may need to be initiated on renal replacement therapy in-house. Aila and I have had an extensive discussion about this on 7/12 and again today in the ER. She wishes to pursue incenter hemodialysis and is accepting of starting dialysis if needed in-house.  Gean Quint, MD [facility]

## 2021-06-10 ENCOUNTER — Encounter (HOSPITAL_COMMUNITY): Payer: Self-pay | Admitting: Internal Medicine

## 2021-06-10 DIAGNOSIS — E876 Hypokalemia: Secondary | ICD-10-CM | POA: Diagnosis present

## 2021-06-10 DIAGNOSIS — D631 Anemia in chronic kidney disease: Secondary | ICD-10-CM | POA: Diagnosis present

## 2021-06-10 DIAGNOSIS — N051 Unspecified nephritic syndrome with focal and segmental glomerular lesions: Secondary | ICD-10-CM | POA: Diagnosis present

## 2021-06-10 DIAGNOSIS — N189 Chronic kidney disease, unspecified: Secondary | ICD-10-CM | POA: Diagnosis present

## 2021-06-10 DIAGNOSIS — E785 Hyperlipidemia, unspecified: Secondary | ICD-10-CM | POA: Diagnosis present

## 2021-06-10 DIAGNOSIS — R739 Hyperglycemia, unspecified: Secondary | ICD-10-CM | POA: Diagnosis present

## 2021-06-10 DIAGNOSIS — E1100 Type 2 diabetes mellitus with hyperosmolarity without nonketotic hyperglycemic-hyperosmolar coma (NKHHC): Secondary | ICD-10-CM

## 2021-06-10 DIAGNOSIS — R7989 Other specified abnormal findings of blood chemistry: Secondary | ICD-10-CM | POA: Diagnosis present

## 2021-06-10 DIAGNOSIS — E1169 Type 2 diabetes mellitus with other specified complication: Secondary | ICD-10-CM | POA: Diagnosis present

## 2021-06-10 LAB — BASIC METABOLIC PANEL
Anion gap: 13 (ref 5–15)
Anion gap: 9 (ref 5–15)
Anion gap: 9 (ref 5–15)
BUN: 96 mg/dL — ABNORMAL HIGH (ref 8–23)
BUN: 88 mg/dL — ABNORMAL HIGH (ref 8–23)
BUN: 89 mg/dL — ABNORMAL HIGH (ref 8–23)
CO2: 20 mmol/L — ABNORMAL LOW (ref 22–32)
CO2: 22 mmol/L (ref 22–32)
CO2: 22 mmol/L (ref 22–32)
Calcium: 8.9 mg/dL (ref 8.9–10.3)
Calcium: 8.9 mg/dL (ref 8.9–10.3)
Calcium: 8.8 mg/dL — ABNORMAL LOW (ref 8.9–10.3)
Chloride: 96 mmol/L — ABNORMAL LOW (ref 98–111)
Chloride: 98 mmol/L (ref 98–111)
Chloride: 97 mmol/L — ABNORMAL LOW (ref 98–111)
Creatinine, Ser: 4.74 mg/dL — ABNORMAL HIGH (ref 0.44–1.00)
Creatinine, Ser: 4.47 mg/dL — ABNORMAL HIGH (ref 0.44–1.00)
Creatinine, Ser: 4.63 mg/dL — ABNORMAL HIGH (ref 0.44–1.00)
GFR, Estimated: 9 mL/min — ABNORMAL LOW (ref >60)
GFR, Estimated: 10 mL/min — ABNORMAL LOW (ref >60)
GFR, Estimated: 9 mL/min — ABNORMAL LOW (ref >60)
Glucose, Bld: 215 mg/dL — ABNORMAL HIGH (ref 70–99)
Glucose, Bld: 143 mg/dL — ABNORMAL HIGH (ref 70–99)
Glucose, Bld: 151 mg/dL — ABNORMAL HIGH (ref 70–99)
Potassium: 3.2 mmol/L — ABNORMAL LOW (ref 3.5–5.1)
Potassium: 3 mmol/L — ABNORMAL LOW (ref 3.5–5.1)
Potassium: 3 mmol/L — ABNORMAL LOW (ref 3.5–5.1)
Sodium: 129 mmol/L — ABNORMAL LOW (ref 135–145)
Sodium: 129 mmol/L — ABNORMAL LOW (ref 135–145)
Sodium: 128 mmol/L — ABNORMAL LOW (ref 135–145)

## 2021-06-10 LAB — GLUCOSE, CAPILLARY
Glucose-Capillary: 246 mg/dL — ABNORMAL HIGH (ref 70–99)
Glucose-Capillary: 384 mg/dL — ABNORMAL HIGH (ref 70–99)
Glucose-Capillary: 151 mg/dL — ABNORMAL HIGH (ref 70–99)
Glucose-Capillary: 145 mg/dL — ABNORMAL HIGH (ref 70–99)
Glucose-Capillary: 145 mg/dL — ABNORMAL HIGH (ref 70–99)
Glucose-Capillary: 150 mg/dL — ABNORMAL HIGH (ref 70–99)
Glucose-Capillary: 162 mg/dL — ABNORMAL HIGH (ref 70–99)
Glucose-Capillary: 151 mg/dL — ABNORMAL HIGH (ref 70–99)
Glucose-Capillary: 155 mg/dL — ABNORMAL HIGH (ref 70–99)
Glucose-Capillary: 170 mg/dL — ABNORMAL HIGH (ref 70–99)
Glucose-Capillary: 156 mg/dL — ABNORMAL HIGH (ref 70–99)
Glucose-Capillary: 141 mg/dL — ABNORMAL HIGH (ref 70–99)
Glucose-Capillary: 163 mg/dL — ABNORMAL HIGH (ref 70–99)
Glucose-Capillary: 235 mg/dL — ABNORMAL HIGH (ref 70–99)
Glucose-Capillary: 296 mg/dL — ABNORMAL HIGH (ref 70–99)

## 2021-06-10 MED ORDER — INSULIN GLARGINE 100 UNIT/ML ~~LOC~~ SOLN
5.0000 [IU] | SUBCUTANEOUS | Status: DC
  Administered 2021-06-10: 5 [IU] via SUBCUTANEOUS
  Filled 2021-06-10 (×2): qty 0.05

## 2021-06-10 MED ORDER — INSULIN ASPART 100 UNIT/ML IJ SOLN
0.0000 [IU] | Freq: Three times a day (TID) | INTRAMUSCULAR | Status: DC
  Administered 2021-06-10: 2 [IU] via SUBCUTANEOUS
  Administered 2021-06-10: 3 [IU] via SUBCUTANEOUS
  Administered 2021-06-11: 5 [IU] via SUBCUTANEOUS

## 2021-06-10 MED ORDER — INSULIN ASPART 100 UNIT/ML IJ SOLN: 3.0000 [IU] | Freq: Three times a day (TID) | INTRAMUSCULAR | Status: DC

## 2021-06-10 MED ORDER — INSULIN ASPART 100 UNIT/ML IJ SOLN: 0.0000 [IU] | Freq: Three times a day (TID) | INTRAMUSCULAR | Status: DC

## 2021-06-10 MED ORDER — POTASSIUM CHLORIDE CRYS ER 20 MEQ PO TBCR
40.0000 meq | EXTENDED_RELEASE_TABLET | Freq: Once | ORAL | Status: AC
  Administered 2021-06-10: 40 meq via ORAL
  Filled 2021-06-10: qty 2

## 2021-06-10 MED ORDER — INSULIN STARTER KIT- PEN NEEDLES (ENGLISH)
1.0000 | Freq: Once | Status: AC
  Administered 2021-06-10: 1
  Filled 2021-06-10: qty 1

## 2021-06-10 NOTE — Progress Notes (Addendum)
PROGRESS NOTE    Jackie Wade   WCB:[id number]  DOB: 10/01/1946  PCP: Lucianne Lei, MD    DOA: 06/09/2021 LOS: 1   Assessment & Plan   Principal Problem:   Hyperosmolar hyperglycemic state (HHS) ([place]) Active Problems:   Hyponatremia   Steroid-induced hyperglycemia   Hypokalemia   Type 2 diabetes mellitus without complication, without long-term current use of insulin (HCC)   FSGS (focal segmental glomerulosclerosis)   Essential hypertension   Hyperlipidemia due to type 2 diabetes mellitus ([place])   Anemia due to chronic kidney disease    Hyperlipidemia due to type 2 diabetes mellitus (HCC) On Crestor 15 mg daily as outpatient. Hold for now given severely reduced renal function. Renally dose Crestor or start alternative statin.  Hyperosmolar hyperglycemic state (HHS) ([place]) Has been on steroid taper per nephrology for the past 3 weeks.   Symptomatic with excessive thirst and polyuria. Blood glucose 1093 at PCP. Placed on insulin drip on admission. Transition of subcutaneous insulin today. Diabetes coordinator following. Follow-up pending A1c.  Anemia due to chronic kidney disease Chronic, appears stable.  No evidence of bleeding. Monitor CBC.  Follow-up iron panel.  Pseudohyponatremia Initial Na 119, corrects to 131 given severe hyperglycemia. Monitor BMP.  Hyponatremia Initial Na 119 but in the setting of severe hyperglycemia glucose 865. Glucose today in the 100s and sodium 128. She appears essentially euvolemic, but may be dry which would be expected of with the degree of her hyperglycemia. On IV fluids. Monitor BMP.  Steroid-induced hyperglycemia Had been on empiric steroids x3 weeks per nephrology prior to admission. Will require insulin at discharge.  Management as above.  Type 2 diabetes mellitus without complication, without long-term current use of insulin (HCC) Recent home regimen metformin and Januvia. Now with severe steroid-induced  hyperglycemia, will require insulin at least in the near term. Follow-up pending A1c. Transition off insulin drip today. Started on Lantus 5 units daily and NovoLog SSI 0-9.  FSGS (focal segmental glomerulosclerosis) Recently has been undergoing evaluation including renal biopsy with Dr. Candiss Norse. Patient likely to be started on dialysis soon, possibly this admission.  Essential hypertension Uncontrolled on admission.  Takes amlodipine 5 mg daily at home. Amlodipine increased to 10 mg daily. As needed labetalol. Monitor BP.  Hypokalemia Monitoring closely and replacing. Follow BMP, replace PRN. Follow Mg levels.     Patient BMI: Body mass index is 23.42 kg/m.   DVT prophylaxis: heparin injection 5,000 Units Start: 06/09/21 1700   Diet:  Diet Orders (From admission, onward)     Start     Ordered   06/10/21 0828  Diet Carb Modified Fluid consistency: Thin; Room service appropriate? Yes  Diet effective now       Question Answer Comment  Diet-HS Snack? Nothing   Calorie Level Medium 1600-2000   Fluid consistency: Thin   Room service appropriate? Yes      06/10/21 0827              Code Status: Full Code   Brief Narrative / Hospital Course to Date:   75 year old female with past medical history of type 2 diabetes currently on metformin and Januvia, CKD stage IV, hypertension, hyperlipidemia presented to the ED with persistent fatigue, excessive thirst and polyuria.  She had been started on steroids per nephrology 3 weeks prior for worsening kidney function.  Had renal biopsy done which showed severe diabetic glomerulosclerosis with FSGS.  Patient was seen by PCP for hyperglycemia, glucose there was 1093.  Sent to the  ED and admitted on insulin drip.  Subjective 06/10/21    Patient reports having some nausea overnight as her blood sugar came down, denied vomiting.  Discusses her recent course with nephrology.  May need to be started on dialysis or get renal transplant.   She denies other acute complaints.  Has not previously been on insulin.   Disposition Plan & Communication   Status is: Inpatient  Remains inpatient appropriate because: Transitioning to subcutaneous insulin and will need to titrate dose  Dispo: The patient is from: Home              Anticipated d/c is to: Home              Patient currently is not medically stable to d/c.   Difficult to place patient No   Consults, Procedures, Significant Events   Consultants:  None  Procedures:  None  Antimicrobials:  Anti-infectives (From admission, onward)    None         Micro    Objective   Vitals:   06/09/21 2235 06/10/21 0338 06/10/21 0750 06/10/21 1148  BP: 136/70 (!) 154/72 (!) 156/62 (!) 146/78  Pulse: 72 70 72 70  Resp:  '20 18 16  ' Temp: 98.3 F (36.8 C) 98 F (36.7 C) 98 F (36.7 C) 98 F (36.7 C)  TempSrc: Oral Oral Oral Oral  SpO2: 100% 100% 100% 100%  Weight:      Height:        Intake/Output Summary (Last 24 hours) at 06/10/2021 1632 Last data filed at 06/10/2021 1300 Gross per 24 hour  Intake 1669.73 ml  Output 3 ml  Net 1666.73 ml   Filed Weights   06/09/21 1422 06/09/21 2200  Weight: 60.3 kg 60 kg    Physical Exam:  General exam: awake, alert, no acute distress HEENT: atraumatic, clear conjunctiva, anicteric sclera, moist mucus membranes, hearing grossly normal  Respiratory system: CTAB, no wheezes, rales or rhonchi, normal respiratory effort. Cardiovascular system: normal S1/S2, RRR, no JVD, murmurs, rubs, gallops, no pedal edema.   Gastrointestinal system: soft, NT, ND, no HSM felt, +bowel sounds. Central nervous system: A&O x4. no gross focal neurologic deficits, normal speech Extremities: moves all, no edema, normal tone Psychiatry: normal mood, congruent affect, judgement and insight appear normal  Labs   Data Reviewed: I have personally reviewed following labs and imaging studies  CBC: Recent Labs  Lab 06/09/21 1328  06/09/21 1349  WBC 6.0  --   NEUTROABS 5.6  --   HGB 9.3* 9.5*  HCT 26.8* 28.0*  MCV 84.0  --   PLT 200  --    Basic Metabolic Panel: Recent Labs  Lab 06/09/21 1652 06/09/21 1941 06/10/21 0032 06/10/21 0532 06/10/21 0723  NA 120* 122* 129* 129* 128*  K 4.1 3.9 3.2* 3.0* 3.0*  CL 87* 90* 96* 98 97*  CO2 19* 19* 20* 22 22  GLUCOSE 873* 750* 215* 143* 151*  BUN 99* 94* 96* 88* 89*  CREATININE 4.87* 4.82* 4.74* 4.47* 4.63*  CALCIUM 8.5* 8.6* 8.9 8.9 8.8*   GFR: Estimated Creatinine Clearance: 9 mL/min (A) (by C-G formula based on SCr of 4.63 mg/dL (H)). Liver Function Tests: Recent Labs  Lab 06/09/21 1328  AST 35  ALT 40  ALKPHOS 127*  BILITOT 0.8  PROT 6.6  ALBUMIN 3.5   No results for input(s): LIPASE, AMYLASE in the last 168 hours. No results for input(s): AMMONIA in the last 168 hours. Coagulation Profile: No results for  input(s): INR, PROTIME in the last 168 hours. Cardiac Enzymes: No results for input(s): CKTOTAL, CKMB, CKMBINDEX, TROPONINI in the last 168 hours. BNP (last 3 results) No results for input(s): PROBNP in the last 8760 hours. HbA1C: No results for input(s): HGBA1C in the last 72 hours. CBG: Recent Labs  Lab 06/10/21 0907 06/10/21 1003 06/10/21 1055 06/10/21 1136 06/10/21 1303  GLUCAP 170* 155* 156* 141* 163*   Lipid Profile: No results for input(s): CHOL, HDL, LDLCALC, TRIG, CHOLHDL, LDLDIRECT in the last 72 hours. Thyroid Function Tests: No results for input(s): TSH, T4TOTAL, FREET4, T3FREE, THYROIDAB in the last 72 hours. Anemia Panel: Recent Labs    06/09/21 1652  FERRITIN 830*  TIBC 232*  IRON 122  RETICCTPCT 0.7   Sepsis Labs: No results for input(s): PROCALCITON, LATICACIDVEN in the last 168 hours.  Recent Results (from the past 240 hour(s))  Resp Panel by RT-PCR (Flu A&B, Covid) Nasopharyngeal Swab     Status: None   Collection Time: 06/09/21  5:57 PM   Specimen: Nasopharyngeal Swab; Nasopharyngeal(NP) swabs in vial  transport medium  Result Value Ref Range Status   SARS Coronavirus 2 by RT PCR NEGATIVE NEGATIVE Final    Comment: (NOTE) SARS-CoV-2 target nucleic acids are NOT DETECTED.  The SARS-CoV-2 RNA is generally detectable in upper respiratory specimens during the acute phase of infection. The lowest concentration of SARS-CoV-2 viral copies this assay can detect is 138 copies/mL. A negative result does not preclude SARS-Cov-2 infection and should not be used as the sole basis for treatment or other patient management decisions. A negative result may occur with  improper specimen collection/handling, submission of specimen other than nasopharyngeal swab, presence of viral mutation(s) within the areas targeted by this assay, and inadequate number of viral copies(<138 copies/mL). A negative result must be combined with clinical observations, patient history, and epidemiological information. The expected result is Negative.  Fact Sheet for Patients:  EntrepreneurPulse.com.au  Fact Sheet for Healthcare Providers:  IncredibleEmployment.be  This test is no t yet approved or cleared by the [place] FDA and  has been authorized for detection and/or diagnosis of SARS-CoV-2 by FDA under an Emergency Use Authorization (EUA). This EUA will remain  in effect (meaning this test can be used) for the duration of the COVID-19 declaration under Section 564(b)(1) of the Act, 21 U.S.C.section 360bbb-3(b)(1), unless the authorization is terminated  or revoked sooner.       Influenza A by PCR NEGATIVE NEGATIVE Final   Influenza B by PCR NEGATIVE NEGATIVE Final    Comment: (NOTE) The Xpert Xpress SARS-CoV-2/FLU/RSV plus assay is intended as an aid in the diagnosis of influenza from Nasopharyngeal swab specimens and should not be used as a sole basis for treatment. Nasal washings and aspirates are unacceptable for Xpert Xpress SARS-CoV-2/FLU/RSV testing.  Fact  Sheet for Patients: EntrepreneurPulse.com.au  Fact Sheet for Healthcare Providers: IncredibleEmployment.be  This test is not yet approved or cleared by the [place] FDA and has been authorized for detection and/or diagnosis of SARS-CoV-2 by FDA under an Emergency Use Authorization (EUA). This EUA will remain in effect (meaning this test can be used) for the duration of the COVID-19 declaration under Section 564(b)(1) of the Act, 21 U.S.C. section 360bbb-3(b)(1), unless the authorization is terminated or revoked.  Performed at [facility], [place] [address], [place], [place] [address]       Imaging Studies   No results found.   Medications   Scheduled Meds:  amLODipine  10  mg Oral Daily   heparin  5,000 Units Subcutaneous Q12H   insulin aspart  0-9 Units Subcutaneous TID WC   insulin glargine  5 Units Subcutaneous Q24H   insulin starter kit- pen needles  1 kit Other Once   pneumococcal 23 valent vaccine  0.5 mL Intramuscular Tomorrow-1000   predniSONE  40 mg Oral Q breakfast   Continuous Infusions:  lactated ringers 125 mL/hr at 06/10/21 0925       LOS: 1 day    Time spent: 30 minutes    Ezekiel Slocumb, DO [facility]  06/10/2021, 4:32 PM      If 7PM-7AM, please contact night-coverage. How to contact the [facility] Attending or Consulting provider [place] or covering provider during after hours [place], for this patient?    Check the care team in [facility] and look for a) attending/consulting [facility] provider listed and b) the [facility] team listed Log into [email or web address] and use [place]'s universal password to access. If you do not have the password, please contact the hospital operator. Locate the [facility] provider you are looking for under [facility] and page to a number that you can be directly reached. If you still have difficulty reaching the provider, please page the [facility] (Director on Call) for the Hospitalists  listed on amion for assistance.

## 2021-06-10 NOTE — Progress Notes (Addendum)
Inpatient Diabetes Program Recommendations  AACE/ADA: New Consensus Statement on Inpatient Glycemic Control (2015)  Target Ranges:  Prepandial:   less than 140 mg/dL      Peak postprandial:   less than 180 mg/dL (1-2 hours)      Critically ill patients:  140 - 180 mg/dL   Lab Results  Component Value Date   GLUCAP 155 (H) 06/10/2021    Review of Glycemic Control  Diabetes history: DM 2 Outpatient Diabetes medications: Januvia metformin (d/c'd due to kidney function) Current orders for Inpatient glycemic control:  Lantus 5 units Novolog 0-9 units tid  Glucose 873 on presentation  Inpatient Diabetes Program Recommendations:    Starting insulin at time of d/c  Spoke with pt over the phone regarding glucose levels, steroid use, and needing insulin at time of d/c. Explained where, how and when to give insulin. Described how to use an insulin pen. Cautioned pt on what to look for with hypoglycemia as steroid dose decreases and is stopped. Discussed treatment for hypoglycemia.  Emailed pt video on how to use insulin pen, hypoglycemia. Ordered insulin pen teaching kit.   D/C: Insulin pen Insulin pen needles order #[id number]  Thanks,  Tama Headings RN, MSN, BC-ADM Inpatient Diabetes Coordinator Team Pager [phone] (8a-5p)

## 2021-06-10 NOTE — Assessment & Plan Note (Signed)
On Crestor 15 mg daily as outpatient. Hold for now given severely reduced renal function. Renally dose Crestor or start alternative statin.

## 2021-06-10 NOTE — Assessment & Plan Note (Signed)
Recent home regimen metformin and Januvia. Now with severe steroid-induced hyperglycemia, will require insulin at least in the near term. Follow-up pending A1c. Transition off insulin drip today. Started on Lantus 5 units daily and NovoLog SSI 0-9.

## 2021-06-10 NOTE — Assessment & Plan Note (Signed)
Recently has been undergoing evaluation including renal biopsy with Dr. Candiss Norse. Patient likely to be started on dialysis soon, possibly this admission.

## 2021-06-10 NOTE — Assessment & Plan Note (Signed)
Had been on empiric steroids x3 weeks per nephrology prior to admission. Will require insulin at discharge.  Management as above.

## 2021-06-10 NOTE — Assessment & Plan Note (Signed)
Chronic, appears stable.  No evidence of bleeding. Monitor CBC.  Follow-up iron panel.

## 2021-06-10 NOTE — Assessment & Plan Note (Signed)
Uncontrolled on admission.  Takes amlodipine 5 mg daily at home. Amlodipine increased to 10 mg daily. As needed labetalol. Monitor BP.

## 2021-06-10 NOTE — Assessment & Plan Note (Signed)
Has been on steroid taper per nephrology for the past 3 weeks.   Symptomatic with excessive thirst and polyuria. Blood glucose 1093 at PCP. Placed on insulin drip on admission. Transition of subcutaneous insulin today. Diabetes coordinator following. Follow-up pending A1c.

## 2021-06-10 NOTE — Assessment & Plan Note (Signed)
Monitoring closely and replacing. Follow BMP, replace PRN. Follow Mg levels.

## 2021-06-10 NOTE — Assessment & Plan Note (Signed)
Initial Na 119 but in the setting of severe hyperglycemia glucose 865. Glucose today in the 100s and sodium 128. She appears essentially euvolemic, but may be dry which would be expected of with the degree of her hyperglycemia. On IV fluids. Monitor BMP.

## 2021-06-10 NOTE — Assessment & Plan Note (Signed)
Initial Na 119, corrects to 131 given severe hyperglycemia. Monitor BMP.

## 2021-06-10 NOTE — Progress Notes (Signed)
Inpatient Diabetes Program   AACE/ADA: New Consensus Statement on Inpatient Glycemic Control  Target Ranges:  Prepandial:   less than 140 mg/dL      Peak postprandial:   less than 180 mg/dL (1-2 hours)      Critically ill patients:  140 - 180 mg/dL    NOTE: Diabetes Coordinator Tobey Grim, RN) spoke with patient over the phone regarding insulin pens.  Educated patient on insulin pen use at home.  Reviewed all steps of insulin pen including attachment of needle, 2-unit air shot, dialing up dose, giving injection, removing needle, disposal of sharps, storage of unused insulin, disposal of insulin etc. Patient able to provide successful return demonstration.   Thanks, Barnie Alderman, RN, MSN, CDE Diabetes Coordinator Inpatient Diabetes Program [phone] (Team Pager from 8am to 5pm)

## 2021-06-11 ENCOUNTER — Encounter (HOSPITAL_COMMUNITY): Payer: Self-pay | Admitting: Internal Medicine

## 2021-06-11 LAB — BASIC METABOLIC PANEL
Anion gap: 8 (ref 5–15)
BUN: 84 mg/dL — ABNORMAL HIGH (ref 8–23)
CO2: 21 mmol/L — ABNORMAL LOW (ref 22–32)
Calcium: 8.6 mg/dL — ABNORMAL LOW (ref 8.9–10.3)
Chloride: 102 mmol/L (ref 98–111)
Creatinine, Ser: 4.42 mg/dL — ABNORMAL HIGH (ref 0.44–1.00)
GFR, Estimated: 10 mL/min — ABNORMAL LOW (ref >60)
Glucose, Bld: 313 mg/dL — ABNORMAL HIGH (ref 70–99)
Potassium: 4.1 mmol/L (ref 3.5–5.1)
Sodium: 131 mmol/L — ABNORMAL LOW (ref 135–145)

## 2021-06-11 LAB — GLUCOSE, CAPILLARY
Glucose-Capillary: 283 mg/dL — ABNORMAL HIGH (ref 70–99)
Glucose-Capillary: 202 mg/dL — ABNORMAL HIGH (ref 70–99)

## 2021-06-11 LAB — HEMOGLOBIN A1C
Hgb A1c MFr Bld: 10.9 % — ABNORMAL HIGH (ref 4.8–5.6)
Mean Plasma Glucose: 266 mg/dL

## 2021-06-11 MED ORDER — INSULIN GLARGINE 100 UNIT/ML SOLOSTAR PEN: 8.0000 [IU] | PEN_INJECTOR | Freq: Every day | SUBCUTANEOUS | 1 refills | Status: DC

## 2021-06-11 MED ORDER — PREDNISONE 10 MG PO TABS: 10.0000 mg | ORAL_TABLET | Freq: Every day | ORAL | Status: DC

## 2021-06-11 MED ORDER — INSULIN ASPART 100 UNIT/ML FLEXPEN: 0.0000 [IU] | PEN_INJECTOR | Freq: Three times a day (TID) | SUBCUTANEOUS | 11 refills | Status: DC

## 2021-06-11 MED ORDER — INSULIN ASPART 100 UNIT/ML IJ SOLN: 0.0000 [IU] | Freq: Every day | INTRAMUSCULAR | Status: DC

## 2021-06-11 MED ORDER — AMLODIPINE BESYLATE 10 MG PO TABS: 10.0000 mg | ORAL_TABLET | Freq: Every day | ORAL | 1 refills | Status: DC

## 2021-06-11 MED ORDER — PEN NEEDLES 31G X 6 MM MISC: 1.0000 | 0 refills | Status: DC | PRN

## 2021-06-11 MED ORDER — PREDNISONE 20 MG PO TABS: 20.0000 mg | ORAL_TABLET | Freq: Every day | ORAL | Status: DC

## 2021-06-11 MED ORDER — PREDNISONE 5 MG PO TABS: 5.0000 mg | ORAL_TABLET | Freq: Every day | ORAL | Status: DC

## 2021-06-11 MED ORDER — FREESTYLE LIBRE 2 READER DEVI: 1.0000 | Freq: Three times a day (TID) | 0 refills | Status: DC

## 2021-06-11 MED ORDER — ROSUVASTATIN CALCIUM 5 MG PO TABS: 5.0000 mg | ORAL_TABLET | Freq: Every day | ORAL | 0 refills | Status: DC

## 2021-06-11 MED ORDER — INSULIN ASPART 100 UNIT/ML IJ SOLN
0.0000 [IU] | Freq: Three times a day (TID) | INTRAMUSCULAR | Status: DC
  Administered 2021-06-11: 3 [IU] via SUBCUTANEOUS

## 2021-06-11 MED ORDER — BLOOD GLUCOSE MONITOR KIT: PACK | 0 refills | Status: DC

## 2021-06-11 MED ORDER — PREDNISONE 10 MG PO TABS: ORAL_TABLET | ORAL | 0 refills | Status: DC

## 2021-06-11 MED ORDER — FREESTYLE LIBRE 2 SENSOR MISC: 1.0000 | 1 refills | Status: DC

## 2021-06-11 MED ORDER — INSULIN GLARGINE 100 UNIT/ML ~~LOC~~ SOLN
7.0000 [IU] | SUBCUTANEOUS | Status: DC
  Administered 2021-06-11: 7 [IU] via SUBCUTANEOUS
  Filled 2021-06-11: qty 0.07

## 2021-06-11 NOTE — Discharge Instructions (Signed)
Check your Blood Sugar 3 times daily Before Meals and at Bedtime.  Here is the "Sliding Scale" dosing for Novolog (short-acting, mealtime) Insulin:  Take 0-9 units 3 times daily with meals as follows: If CBG 70-120: 0 units If CBG 121-150: 1 unit If CBG 151-200: 2 units If CBG 201-250: 3 units If CBG 251-300: 5 units If CBG 301-350: 7 units If CBG 351-400: 9 units If CBG over 400: 12 units and call MD    As the dose of prednisone is lowered and once you're off prednisone, you may find your sugars do not run as high. Steroids cause high blood sugars.  Once off Prednisone, if you are having readings frequently below 80, contact your doctor to adjust your insulin dose.   You may not require 8 units of the long-acting insulin - Lantus (insulin glargine) after stopping prednisone.  Be sure to follow up closely with Dr. Candiss Norse and your primary care doctor.

## 2021-06-11 NOTE — Plan of Care (Signed)
  Problem: Clinical Measurements: Goal: Respiratory complications will improve Outcome: Progressing Goal: Cardiovascular complication will be avoided Outcome: Progressing   

## 2021-06-11 NOTE — Discharge Summary (Signed)
Physician Discharge Summary  Jackie Wade JOA:[id number] DOB: 27-May-1946 DOA: 06/09/2021  PCP: Lucianne Lei, MD  Admit date: 06/09/2021 Discharge date: 06/11/2021  Admitted From: home Disposition:  home  Recommendations for Outpatient Follow-up:  Follow up with PCP in 1-2 weeks Please obtain BMP/CBC in one week Please follow up on patient's glycemic control and adjust insulin regimen as needed.  Metformin stopped given poor renal function.    Home Health: No  Equipment/Devices: none   Discharge Condition: stable  CODE STATUS: full   Diet recommendation:  Carb Modified    Discharge Diagnoses: Principal Problem:   Hyperosmolar hyperglycemic state (HHS) ([place]) Active Problems:   Hyponatremia   Steroid-induced hyperglycemia   Hypokalemia   Type 2 diabetes mellitus without complication, without long-term current use of insulin (HCC)   FSGS (focal segmental glomerulosclerosis)   Essential hypertension   Hyperlipidemia due to type 2 diabetes mellitus ([place])   Anemia due to chronic kidney disease    Summary of HPI and Hospital Course:   75 year old female with past medical history of type 2 diabetes currently on metformin and Januvia, CKD stage IV, hypertension, hyperlipidemia presented to the ED with persistent fatigue, excessive thirst and polyuria.  She had been started on steroids per nephrology 3 weeks prior for worsening kidney function.  Had renal biopsy done which showed severe diabetic glomerulosclerosis with FSGS.  Patient was seen by PCP for hyperglycemia, glucose there was 1093.  Sent to the ED and admitted on insulin drip.   Hyperosmolar hyperglycemic state (HHS) ([place]) Has been on steroid taper per nephrology for the past 3 weeks.  Symptomatic with fatigue, excessive thirst and polyuria.  Blood glucose 1093 at PCP office. Placed on insulin drip on admission. Transition of subcutaneous insulin on day 2. Diabetes coordinator provided extensive pt eduction.   Type 2  diabetes mellitus without complication, without long-term current use of insulin (HCC) Uncontrolled with steroid-induced hyperglycemia Recent home regimen metformin and Januvia. Now with severe steroid-induced hyperglycemia, will require insulin at least in the near term. Hbg A1c 10.9% this admission. Diabetes coordinated provided patient education on insulin use. Treated with insulin drip and transitioned to subcutaneous insulin on hospital day 2. Discharging on Lantus 8 units daily + NovoLog SSI 0-9 Rx's sent for [facility] 2, and traditional glucometer kit, lancets, test strips, insulin pens and pen needles.   Hyponatremia Initial Na 119 but in the setting of severe hyperglycemia glucose 865. Glucose in the 100s on day 2 with sodium 128. She appears essentially euvolemic, but may be dry which would be expected of with the degree of her hyperglycemia. Improved with IV hydration and improvement in PO intake. --Re-check BMP in close follow up.   FSGS (focal segmental glomerulosclerosis) Recently has been undergoing evaluation including renal biopsy with Dr. Candiss Norse.  Kidney biopsy recently confirmed FSGS.  Had been on steroids with not much repsonse. May need to be started on dialysis soon. She had uremic-like symptoms on admission that improved once glucose was controlled.   Hyperlipidemia due to type 2 diabetes mellitus (HCC) On Crestor 15 mg daily as outpatient. Reduced dose to 5 mg based on renal function. Could consider alternative statin.    Anemia due to chronic kidney disease Chronic, appears stable.  No evidence of bleeding. Monitor CBC.      Essential hypertension Uncontrolled on admission.   Per nephrology, Lozol was stopped. Previously on amlodipine 5 mg daily at home. Amlodipine increased to 10 mg daily. Monitor BP.    Hypokalemia -  resolved with replacement. Check BMP and Mg levels in follow up.       Discharge Instructions   Discharge Instructions      Call MD for:   Complete by: As directed    Uncontrolled blood sugars - If dropping low or running to high (200's+).   Call MD for:  extreme fatigue   Complete by: As directed    Call MD for:  persistant dizziness or light-headedness   Complete by: As directed    Call MD for:  persistant nausea and vomiting   Complete by: As directed    Call MD for:  temperature >100.4   Complete by: As directed    Diet - low sodium heart healthy   Complete by: As directed    Increase activity slowly   Complete by: As directed       Allergies as of 06/11/2021   No Known Allergies      Medication List     STOP taking these medications    indapamide 1.25 MG tablet Commonly known as: LOZOL   metFORMIN 500 MG tablet Commonly known as: GLUCOPHAGE       TAKE these medications    amLODipine 10 MG tablet Commonly known as: NORVASC Take 1 tablet (10 mg total) by mouth daily. Start taking on: June 12, 2021 What changed:  medication strength how much to take   blood glucose meter kit and supplies Kit Dispense based on patient and insurance preference. Use up to four times daily as directed.   FreeStyle Libre 2 Reader Devi 1 each by Does not apply route 4 (four) times daily -  before meals and at bedtime.   FreeStyle Libre 2 Sensor Misc 1 each by Does not apply route every 14 (fourteen) days.   furosemide 40 MG tablet Commonly known as: LASIX Take 40-80 mg by mouth See admin instructions. 80 mg in the morning, and 40 mg at 4p   insulin aspart 100 UNIT/ML FlexPen Commonly known as: NOVOLOG Inject 0-9 Units into the skin 3 (three) times daily with meals. Take # of units per sliding scale instructions provided   insulin glargine 100 UNIT/ML Solostar Pen Commonly known as: LANTUS Inject 8 Units into the skin daily.   Januvia 100 MG tablet Generic drug: sitaGLIPtin Take 100 mg by mouth daily.   Pen Needles 31G X 6 MM Misc 1 each by Does not apply route as needed.    predniSONE 10 MG tablet Commonly known as: DELTASONE Take 2 tablets (20 mg total) by mouth daily with breakfast for 2 days, THEN 1 tablet (10 mg total) daily with breakfast for 3 days, THEN 0.5 tablets (5 mg total) daily with breakfast for 3 days. Start taking on: June 11, 2021   rosuvastatin 5 MG tablet Commonly known as: CRESTOR Take 1 tablet (5 mg total) by mouth daily. What changed:  medication strength how much to take        No Known Allergies   If you experience worsening of your admission symptoms, develop shortness of breath, life threatening emergency, suicidal or homicidal thoughts you must seek medical attention immediately by calling 911 or calling your MD immediately  if symptoms less severe.    Please note   You were cared for by a hospitalist during your hospital stay. If you have any questions about your discharge medications or the care you received while you were in the hospital after you are discharged, you can call the unit and asked to speak  with the hospitalist on call if the hospitalist that took care of you is not available. Once you are discharged, your primary care physician will handle any further medical issues. Please note that NO REFILLS for any discharge medications will be authorized once you are discharged, as it is imperative that you return to your primary care physician (or establish a relationship with a primary care physician if you do not have one) for your aftercare needs so that they can reassess your need for medications and monitor your lab values.   Consultations: none    Procedures/Studies: No results found.   Subjective: Pt feels much better today.  She is eager to going home and asks about returning to work.  States her taste is much better now that sugars controlled.  No N/V, pruritis, F/C, CP SOB or other complaitns.    Discharge Exam: Vitals:   06/11/21 1046 06/11/21 1250  BP: (!) 163/74 (!) 150/74  Pulse: 73 78  Resp: 20  18  Temp:  98.2 F (36.8 C)  SpO2: 100% 100%   Vitals:   06/11/21 0647 06/11/21 0826 06/11/21 1046 06/11/21 1250  BP:  140/85 (!) 163/74 (!) 150/74  Pulse:  73 73 78  Resp:  '15 20 18  ' Temp:  98.4 F (36.9 C)  98.2 F (36.8 C)  TempSrc:  Oral  Oral  SpO2:  99% 100% 100%  Weight: 61.3 kg     Height:        General: Pt is alert, awake, not in acute distress Cardiovascular: RRR, S1/S2 +, no rubs, no gallops Respiratory: CTA bilaterally, no wheezing, no rhonchi Abdominal: Soft, NT, ND, bowel sounds + Extremities: no edema, no cyanosis    The results of significant diagnostics from this hospitalization (including imaging, microbiology, ancillary and laboratory) are listed below for reference.     Microbiology: Recent Results (from the past 240 hour(s))  Resp Panel by RT-PCR (Flu A&B, Covid) Nasopharyngeal Swab     Status: None   Collection Time: 06/09/21  5:57 PM   Specimen: Nasopharyngeal Swab; Nasopharyngeal(NP) swabs in vial transport medium  Result Value Ref Range Status   SARS Coronavirus 2 by RT PCR NEGATIVE NEGATIVE Final    Comment: (NOTE) SARS-CoV-2 target nucleic acids are NOT DETECTED.  The SARS-CoV-2 RNA is generally detectable in upper respiratory specimens during the acute phase of infection. The lowest concentration of SARS-CoV-2 viral copies this assay can detect is 138 copies/mL. A negative result does not preclude SARS-Cov-2 infection and should not be used as the sole basis for treatment or other patient management decisions. A negative result may occur with  improper specimen collection/handling, submission of specimen other than nasopharyngeal swab, presence of viral mutation(s) within the areas targeted by this assay, and inadequate number of viral copies(<138 copies/mL). A negative result must be combined with clinical observations, patient history, and epidemiological information. The expected result is Negative.  Fact Sheet for Patients:   EntrepreneurPulse.com.au  Fact Sheet for Healthcare Providers:  IncredibleEmployment.be  This test is no t yet approved or cleared by the [place] FDA and  has been authorized for detection and/or diagnosis of SARS-CoV-2 by FDA under an Emergency Use Authorization (EUA). This EUA will remain  in effect (meaning this test can be used) for the duration of the COVID-19 declaration under Section 564(b)(1) of the Act, 21 U.S.C.section 360bbb-3(b)(1), unless the authorization is terminated  or revoked sooner.       Influenza A by PCR NEGATIVE NEGATIVE Final  Influenza B by PCR NEGATIVE NEGATIVE Final    Comment: (NOTE) The Xpert Xpress SARS-CoV-2/FLU/RSV plus assay is intended as an aid in the diagnosis of influenza from Nasopharyngeal swab specimens and should not be used as a sole basis for treatment. Nasal washings and aspirates are unacceptable for Xpert Xpress SARS-CoV-2/FLU/RSV testing.  Fact Sheet for Patients: EntrepreneurPulse.com.au  Fact Sheet for Healthcare Providers: IncredibleEmployment.be  This test is not yet approved or cleared by the [place] FDA and has been authorized for detection and/or diagnosis of SARS-CoV-2 by FDA under an Emergency Use Authorization (EUA). This EUA will remain in effect (meaning this test can be used) for the duration of the COVID-19 declaration under Section 564(b)(1) of the Act, 21 U.S.C. section 360bbb-3(b)(1), unless the authorization is terminated or revoked.  Performed at [facility], [place] [address] [place], [place] [address]      Labs: BNP (last 3 results) No results for input(s): BNP in the last 8760 hours. Basic Metabolic Panel: Recent Labs  Lab 06/09/21 1941 06/10/21 0032 06/10/21 0532 06/10/21 0723 06/11/21 0114  NA 122* 129* 129* 128* 131*  K 3.9 3.2* 3.0* 3.0* 4.1  CL 90* 96* 98 97* 102  CO2 19* 20* 22 22 21*  GLUCOSE  750* 215* 143* 151* 313*  BUN 94* 96* 88* 89* 84*  CREATININE 4.82* 4.74* 4.47* 4.63* 4.42*  CALCIUM 8.6* 8.9 8.9 8.8* 8.6*   Liver Function Tests: Recent Labs  Lab 06/09/21 1328  AST 35  ALT 40  ALKPHOS 127*  BILITOT 0.8  PROT 6.6  ALBUMIN 3.5   No results for input(s): LIPASE, AMYLASE in the last 168 hours. No results for input(s): AMMONIA in the last 168 hours. CBC: Recent Labs  Lab 06/09/21 1328 06/09/21 1349  WBC 6.0  --   NEUTROABS 5.6  --   HGB 9.3* 9.5*  HCT 26.8* 28.0*  MCV 84.0  --   PLT 200  --    Cardiac Enzymes: No results for input(s): CKTOTAL, CKMB, CKMBINDEX, TROPONINI in the last 168 hours. BNP: Invalid input(s): POCBNP CBG: Recent Labs  Lab 06/10/21 1303 06/10/21 1658 06/10/21 2022 06/11/21 0614 06/11/21 1301  GLUCAP 163* 235* 296* 283* 202*   D-Dimer No results for input(s): DDIMER in the last 72 hours. Hgb A1c Recent Labs    06/09/21 1652  HGBA1C 10.9*   Lipid Profile No results for input(s): CHOL, HDL, LDLCALC, TRIG, CHOLHDL, LDLDIRECT in the last 72 hours. Thyroid function studies No results for input(s): TSH, T4TOTAL, T3FREE, THYROIDAB in the last 72 hours.  Invalid input(s): FREET3 Anemia work up Recent Labs    06/09/21 1652  FERRITIN 830*  TIBC 232*  IRON 122  RETICCTPCT 0.7   Urinalysis    Component Value Date/Time   COLORURINE YELLOW 06/09/2021 1329   APPEARANCEUR CLEAR 06/09/2021 1329   LABSPEC 1.015 06/09/2021 1329   PHURINE 5.5 06/09/2021 1329   GLUCOSEU >=500 (A) 06/09/2021 1329   HGBUR MODERATE (A) 06/09/2021 1329   BILIRUBINUR NEGATIVE 06/09/2021 1329   KETONESUR NEGATIVE 06/09/2021 1329   PROTEINUR 100 (A) 06/09/2021 1329   NITRITE NEGATIVE 06/09/2021 1329   LEUKOCYTESUR NEGATIVE 06/09/2021 1329   Sepsis Labs Invalid input(s): PROCALCITONIN,  WBC,  LACTICIDVEN Microbiology Recent Results (from the past 240 hour(s))  Resp Panel by RT-PCR (Flu A&B, Covid) Nasopharyngeal Swab     Status: None    Collection Time: 06/09/21  5:57 PM   Specimen: Nasopharyngeal Swab; Nasopharyngeal(NP) swabs in vial transport medium  Result  Value Ref Range Status   SARS Coronavirus 2 by RT PCR NEGATIVE NEGATIVE Final    Comment: (NOTE) SARS-CoV-2 target nucleic acids are NOT DETECTED.  The SARS-CoV-2 RNA is generally detectable in upper respiratory specimens during the acute phase of infection. The lowest concentration of SARS-CoV-2 viral copies this assay can detect is 138 copies/mL. A negative result does not preclude SARS-Cov-2 infection and should not be used as the sole basis for treatment or other patient management decisions. A negative result may occur with  improper specimen collection/handling, submission of specimen other than nasopharyngeal swab, presence of viral mutation(s) within the areas targeted by this assay, and inadequate number of viral copies(<138 copies/mL). A negative result must be combined with clinical observations, patient history, and epidemiological information. The expected result is Negative.  Fact Sheet for Patients:  EntrepreneurPulse.com.au  Fact Sheet for Healthcare Providers:  IncredibleEmployment.be  This test is no t yet approved or cleared by the [place] FDA and  has been authorized for detection and/or diagnosis of SARS-CoV-2 by FDA under an Emergency Use Authorization (EUA). This EUA will remain  in effect (meaning this test can be used) for the duration of the COVID-19 declaration under Section 564(b)(1) of the Act, 21 U.S.C.section 360bbb-3(b)(1), unless the authorization is terminated  or revoked sooner.       Influenza A by PCR NEGATIVE NEGATIVE Final   Influenza B by PCR NEGATIVE NEGATIVE Final    Comment: (NOTE) The Xpert Xpress SARS-CoV-2/FLU/RSV plus assay is intended as an aid in the diagnosis of influenza from Nasopharyngeal swab specimens and should not be used as a sole basis for treatment.  Nasal washings and aspirates are unacceptable for Xpert Xpress SARS-CoV-2/FLU/RSV testing.  Fact Sheet for Patients: EntrepreneurPulse.com.au  Fact Sheet for Healthcare Providers: IncredibleEmployment.be  This test is not yet approved or cleared by the [place] FDA and has been authorized for detection and/or diagnosis of SARS-CoV-2 by FDA under an Emergency Use Authorization (EUA). This EUA will remain in effect (meaning this test can be used) for the duration of the COVID-19 declaration under Section 564(b)(1) of the Act, 21 U.S.C. section 360bbb-3(b)(1), unless the authorization is terminated or revoked.  Performed at [facility], [place] [address] [place], [place] [address]      Time coordinating discharge: Over 30 minutes  SIGNED:   Ezekiel Slocumb, DO [facility] 06/11/2021, 2:29 PM   If 7PM-7AM, please contact night-coverage [email or web address]

## 2021-06-11 NOTE — Progress Notes (Signed)
Pt discharged to home with family.  Pt's IV removed.  Pt taken off telemetry and CCMD notified. Pt left with all of their p[personal belongings.  AVS documentation reviewed and sent home with Pt and all questions answered.

## 2021-06-12 ENCOUNTER — Other Ambulatory Visit: Payer: Self-pay | Admitting: Internal Medicine

## 2021-06-12 MED ORDER — PEN NEEDLES 31G X 6 MM MISC: 1.0000 | 0 refills | Status: DC | PRN

## 2021-06-12 MED ORDER — ROSUVASTATIN CALCIUM 5 MG PO TABS: 5.0000 mg | ORAL_TABLET | Freq: Every day | ORAL | 0 refills | Status: DC

## 2021-06-12 MED ORDER — FREESTYLE LIBRE 2 READER DEVI: 1.0000 | Freq: Three times a day (TID) | 0 refills | Status: DC

## 2021-06-12 MED ORDER — AMLODIPINE BESYLATE 10 MG PO TABS: 10.0000 mg | ORAL_TABLET | Freq: Every day | ORAL | 1 refills | Status: DC

## 2021-06-12 MED ORDER — INSULIN ASPART 100 UNIT/ML FLEXPEN: 0.0000 [IU] | PEN_INJECTOR | Freq: Three times a day (TID) | SUBCUTANEOUS | 11 refills | Status: DC

## 2021-06-12 MED ORDER — FREESTYLE LIBRE 2 SENSOR MISC: 1.0000 | 1 refills | Status: DC

## 2021-06-12 MED ORDER — INSULIN GLARGINE 100 UNIT/ML SOLOSTAR PEN: 8.0000 [IU] | PEN_INJECTOR | Freq: Every day | SUBCUTANEOUS | 1 refills | Status: DC

## 2021-06-12 MED ORDER — BLOOD GLUCOSE MONITOR KIT: PACK | 0 refills | Status: DC

## 2021-06-12 MED ORDER — PREDNISONE 10 MG PO TABS: ORAL_TABLET | ORAL | 0 refills | Status: AC

## 2021-06-12 NOTE — Progress Notes (Unsigned)
Patient called back to the hospital today saying [facility] did not have any of her prescriptions sent on d/c yesterday.  I was notified yesterday after discharge that due to a power glitch, the pharmacy had not received them.  I sent them a second time yesterday afternoon.  I have again re-sent the prescriptions for her insulin pens and needles, Libre 2 reader and sensor, amlodpine and crestor, prednisone taper.

## 2021-06-16 DIAGNOSIS — I10 Essential (primary) hypertension: Secondary | ICD-10-CM | POA: Diagnosis not present

## 2021-06-16 DIAGNOSIS — E1122 Type 2 diabetes mellitus with diabetic chronic kidney disease: Secondary | ICD-10-CM | POA: Diagnosis not present

## 2021-06-16 DIAGNOSIS — Z794 Long term (current) use of insulin: Secondary | ICD-10-CM | POA: Diagnosis not present

## 2021-06-16 DIAGNOSIS — I129 Hypertensive chronic kidney disease with stage 1 through stage 4 chronic kidney disease, or unspecified chronic kidney disease: Secondary | ICD-10-CM | POA: Diagnosis not present

## 2021-06-16 DIAGNOSIS — E1169 Type 2 diabetes mellitus with other specified complication: Secondary | ICD-10-CM | POA: Diagnosis not present

## 2021-06-16 DIAGNOSIS — D631 Anemia in chronic kidney disease: Secondary | ICD-10-CM | POA: Diagnosis not present

## 2021-06-16 DIAGNOSIS — R600 Localized edema: Secondary | ICD-10-CM | POA: Diagnosis not present

## 2021-06-16 DIAGNOSIS — N184 Chronic kidney disease, stage 4 (severe): Secondary | ICD-10-CM | POA: Diagnosis not present

## 2021-06-16 DIAGNOSIS — Z8616 Personal history of COVID-19: Secondary | ICD-10-CM | POA: Diagnosis not present

## 2021-06-17 DIAGNOSIS — H903 Sensorineural hearing loss, bilateral: Secondary | ICD-10-CM | POA: Insufficient documentation

## 2021-06-21 DIAGNOSIS — R600 Localized edema: Secondary | ICD-10-CM | POA: Diagnosis not present

## 2021-06-21 DIAGNOSIS — I10 Essential (primary) hypertension: Secondary | ICD-10-CM | POA: Diagnosis not present

## 2021-06-21 DIAGNOSIS — D631 Anemia in chronic kidney disease: Secondary | ICD-10-CM | POA: Diagnosis not present

## 2021-06-21 DIAGNOSIS — E1169 Type 2 diabetes mellitus with other specified complication: Secondary | ICD-10-CM | POA: Diagnosis not present

## 2021-06-28 ENCOUNTER — Other Ambulatory Visit: Payer: Self-pay

## 2021-06-28 DIAGNOSIS — E782 Mixed hyperlipidemia: Secondary | ICD-10-CM | POA: Insufficient documentation

## 2021-06-28 DIAGNOSIS — E785 Hyperlipidemia, unspecified: Secondary | ICD-10-CM | POA: Insufficient documentation

## 2021-06-28 DIAGNOSIS — E1165 Type 2 diabetes mellitus with hyperglycemia: Secondary | ICD-10-CM | POA: Insufficient documentation

## 2021-06-28 DIAGNOSIS — E119 Type 2 diabetes mellitus without complications: Secondary | ICD-10-CM | POA: Insufficient documentation

## 2021-06-28 DIAGNOSIS — Z1211 Encounter for screening for malignant neoplasm of colon: Secondary | ICD-10-CM | POA: Insufficient documentation

## 2021-06-28 DIAGNOSIS — E559 Vitamin D deficiency, unspecified: Secondary | ICD-10-CM | POA: Insufficient documentation

## 2021-06-28 DIAGNOSIS — E1169 Type 2 diabetes mellitus with other specified complication: Secondary | ICD-10-CM | POA: Insufficient documentation

## 2021-06-28 DIAGNOSIS — N179 Acute kidney failure, unspecified: Secondary | ICD-10-CM

## 2021-06-30 DIAGNOSIS — R609 Edema, unspecified: Secondary | ICD-10-CM | POA: Diagnosis not present

## 2021-06-30 DIAGNOSIS — N2581 Secondary hyperparathyroidism of renal origin: Secondary | ICD-10-CM | POA: Diagnosis not present

## 2021-06-30 DIAGNOSIS — N032 Chronic nephritic syndrome with diffuse membranous glomerulonephritis: Secondary | ICD-10-CM | POA: Diagnosis not present

## 2021-06-30 DIAGNOSIS — D472 Monoclonal gammopathy: Secondary | ICD-10-CM | POA: Diagnosis not present

## 2021-06-30 DIAGNOSIS — E1122 Type 2 diabetes mellitus with diabetic chronic kidney disease: Secondary | ICD-10-CM | POA: Diagnosis not present

## 2021-06-30 DIAGNOSIS — E785 Hyperlipidemia, unspecified: Secondary | ICD-10-CM | POA: Diagnosis not present

## 2021-06-30 DIAGNOSIS — D631 Anemia in chronic kidney disease: Secondary | ICD-10-CM | POA: Diagnosis not present

## 2021-06-30 DIAGNOSIS — N185 Chronic kidney disease, stage 5: Secondary | ICD-10-CM | POA: Diagnosis not present

## 2021-06-30 DIAGNOSIS — I120 Hypertensive chronic kidney disease with stage 5 chronic kidney disease or end stage renal disease: Secondary | ICD-10-CM | POA: Diagnosis not present

## 2021-07-01 ENCOUNTER — Encounter (HOSPITAL_COMMUNITY): Payer: Self-pay | Admitting: *Deleted

## 2021-07-01 ENCOUNTER — Emergency Department (HOSPITAL_COMMUNITY): Payer: Medicare Other

## 2021-07-01 ENCOUNTER — Inpatient Hospital Stay (HOSPITAL_COMMUNITY)
Admission: EM | Admit: 2021-07-01 | Discharge: 2021-07-04 | DRG: 674 | Disposition: A | Discharge status: Home / Self Care | Payer: Medicare Other | Attending: Internal Medicine | Admitting: Internal Medicine

## 2021-07-01 ENCOUNTER — Other Ambulatory Visit: Payer: Self-pay

## 2021-07-01 DIAGNOSIS — R432 Parageusia: Secondary | ICD-10-CM | POA: Diagnosis present

## 2021-07-01 DIAGNOSIS — N185 Chronic kidney disease, stage 5: Secondary | ICD-10-CM | POA: Diagnosis not present

## 2021-07-01 DIAGNOSIS — D649 Anemia, unspecified: Secondary | ICD-10-CM | POA: Diagnosis not present

## 2021-07-01 DIAGNOSIS — N179 Acute kidney failure, unspecified: Secondary | ICD-10-CM | POA: Diagnosis not present

## 2021-07-01 DIAGNOSIS — I129 Hypertensive chronic kidney disease with stage 1 through stage 4 chronic kidney disease, or unspecified chronic kidney disease: Secondary | ICD-10-CM | POA: Diagnosis not present

## 2021-07-01 DIAGNOSIS — Z20822 Contact with and (suspected) exposure to covid-19: Secondary | ICD-10-CM | POA: Diagnosis present

## 2021-07-01 DIAGNOSIS — E871 Hypo-osmolality and hyponatremia: Secondary | ICD-10-CM | POA: Diagnosis present

## 2021-07-01 DIAGNOSIS — N184 Chronic kidney disease, stage 4 (severe): Secondary | ICD-10-CM | POA: Diagnosis not present

## 2021-07-01 DIAGNOSIS — I120 Hypertensive chronic kidney disease with stage 5 chronic kidney disease or end stage renal disease: Principal | ICD-10-CM | POA: Diagnosis present

## 2021-07-01 DIAGNOSIS — Z7984 Long term (current) use of oral hypoglycemic drugs: Secondary | ICD-10-CM

## 2021-07-01 DIAGNOSIS — Z79899 Other long term (current) drug therapy: Secondary | ICD-10-CM

## 2021-07-01 DIAGNOSIS — N189 Chronic kidney disease, unspecified: Secondary | ICD-10-CM | POA: Diagnosis not present

## 2021-07-01 DIAGNOSIS — E1122 Type 2 diabetes mellitus with diabetic chronic kidney disease: Secondary | ICD-10-CM | POA: Diagnosis not present

## 2021-07-01 DIAGNOSIS — I517 Cardiomegaly: Secondary | ICD-10-CM | POA: Diagnosis not present

## 2021-07-01 DIAGNOSIS — R0602 Shortness of breath: Secondary | ICD-10-CM | POA: Diagnosis not present

## 2021-07-01 DIAGNOSIS — E1169 Type 2 diabetes mellitus with other specified complication: Secondary | ICD-10-CM | POA: Diagnosis present

## 2021-07-01 DIAGNOSIS — E1165 Type 2 diabetes mellitus with hyperglycemia: Secondary | ICD-10-CM | POA: Diagnosis present

## 2021-07-01 DIAGNOSIS — Z794 Long term (current) use of insulin: Secondary | ICD-10-CM | POA: Diagnosis not present

## 2021-07-01 DIAGNOSIS — N186 End stage renal disease: Secondary | ICD-10-CM | POA: Diagnosis not present

## 2021-07-01 DIAGNOSIS — R600 Localized edema: Secondary | ICD-10-CM | POA: Diagnosis present

## 2021-07-01 DIAGNOSIS — D631 Anemia in chronic kidney disease: Secondary | ICD-10-CM | POA: Diagnosis not present

## 2021-07-01 DIAGNOSIS — I10 Essential (primary) hypertension: Secondary | ICD-10-CM

## 2021-07-01 DIAGNOSIS — E119 Type 2 diabetes mellitus without complications: Secondary | ICD-10-CM | POA: Diagnosis present

## 2021-07-01 DIAGNOSIS — E785 Hyperlipidemia, unspecified: Secondary | ICD-10-CM | POA: Diagnosis present

## 2021-07-01 LAB — COMPREHENSIVE METABOLIC PANEL
ALT: 20 U/L (ref 0–44)
AST: 31 U/L (ref 15–41)
Albumin: 2.3 g/dL — ABNORMAL LOW (ref 3.5–5.0)
Alkaline Phosphatase: 64 U/L (ref 38–126)
Anion gap: 10 (ref 5–15)
BUN: 48 mg/dL — ABNORMAL HIGH (ref 8–23)
CO2: 22 mmol/L (ref 22–32)
Calcium: 8.2 mg/dL — ABNORMAL LOW (ref 8.9–10.3)
Chloride: 99 mmol/L (ref 98–111)
Creatinine, Ser: 4.42 mg/dL — ABNORMAL HIGH (ref 0.44–1.00)
GFR, Estimated: 10 mL/min — ABNORMAL LOW (ref >60)
Glucose, Bld: 156 mg/dL — ABNORMAL HIGH (ref 70–99)
Potassium: 4.2 mmol/L (ref 3.5–5.1)
Sodium: 131 mmol/L — ABNORMAL LOW (ref 135–145)
Total Bilirubin: 0.5 mg/dL (ref 0.3–1.2)
Total Protein: 6.3 g/dL — ABNORMAL LOW (ref 6.5–8.1)

## 2021-07-01 LAB — CBC WITH DIFFERENTIAL/PLATELET
Abs Immature Granulocytes: 0.06 10*3/uL (ref 0.00–0.07)
Basophils Absolute: 0 10*3/uL (ref 0.0–0.1)
Basophils Relative: 0 %
Eosinophils Absolute: 0.1 10*3/uL (ref 0.0–0.5)
Eosinophils Relative: 1 %
HCT: 19.9 % — ABNORMAL LOW (ref 36.0–46.0)
Hemoglobin: 6.7 g/dL — CL (ref 12.0–15.0)
Immature Granulocytes: 1 %
Lymphocytes Relative: 10 %
Lymphs Abs: 0.6 10*3/uL — ABNORMAL LOW (ref 0.7–4.0)
MCH: 28.6 pg (ref 26.0–34.0)
MCHC: 33.7 g/dL (ref 30.0–36.0)
MCV: 85 fL (ref 80.0–100.0)
Monocytes Absolute: 0.2 10*3/uL (ref 0.1–1.0)
Monocytes Relative: 4 %
Neutro Abs: 4.9 10*3/uL (ref 1.7–7.7)
Neutrophils Relative %: 84 %
Platelets: 358 10*3/uL (ref 150–400)
RBC: 2.34 MIL/uL — ABNORMAL LOW (ref 3.87–5.11)
RDW: 12.6 % (ref 11.5–15.5)
WBC: 5.9 10*3/uL (ref 4.0–10.5)
nRBC: 0 % (ref 0.0–0.2)

## 2021-07-01 LAB — RESP PANEL BY RT-PCR (FLU A&B, COVID) ARPGX2
Influenza A by PCR: NEGATIVE
Influenza B by PCR: NEGATIVE
SARS Coronavirus 2 by RT PCR: NEGATIVE

## 2021-07-01 LAB — PREPARE RBC (CROSSMATCH)

## 2021-07-01 LAB — ABO/RH: ABO/RH(D): O POS

## 2021-07-01 LAB — POC OCCULT BLOOD, ED: Fecal Occult Bld: NEGATIVE

## 2021-07-01 MED ORDER — LINAGLIPTIN 5 MG PO TABS
5.0000 mg | ORAL_TABLET | Freq: Every day | ORAL | Status: DC
  Administered 2021-07-03: 5 mg via ORAL
  Filled 2021-07-01 (×3): qty 1

## 2021-07-01 MED ORDER — ACETAMINOPHEN 325 MG PO TABS: 650.0000 mg | ORAL_TABLET | Freq: Four times a day (QID) | ORAL | Status: DC | PRN

## 2021-07-01 MED ORDER — ONDANSETRON HCL 4 MG/2ML IJ SOLN: 4.0000 mg | Freq: Four times a day (QID) | INTRAMUSCULAR | Status: DC | PRN

## 2021-07-01 MED ORDER — INSULIN ASPART 100 UNIT/ML IJ SOLN
0.0000 [IU] | INTRAMUSCULAR | Status: DC
  Administered 2021-07-03: 1 [IU] via SUBCUTANEOUS
  Administered 2021-07-03: 2 [IU] via SUBCUTANEOUS
  Administered 2021-07-03 – 2021-07-04 (×2): 1 [IU] via SUBCUTANEOUS

## 2021-07-01 MED ORDER — ROSUVASTATIN CALCIUM 5 MG PO TABS
5.0000 mg | ORAL_TABLET | Freq: Every day | ORAL | Status: DC
  Administered 2021-07-02 – 2021-07-04 (×3): 5 mg via ORAL
  Filled 2021-07-01 (×3): qty 1

## 2021-07-01 MED ORDER — ONDANSETRON HCL 4 MG PO TABS: 4.0000 mg | ORAL_TABLET | Freq: Four times a day (QID) | ORAL | Status: DC | PRN

## 2021-07-01 MED ORDER — ACETAMINOPHEN 650 MG RE SUPP: 650.0000 mg | Freq: Four times a day (QID) | RECTAL | Status: DC | PRN

## 2021-07-01 MED ORDER — AMLODIPINE BESYLATE 10 MG PO TABS
10.0000 mg | ORAL_TABLET | Freq: Every day | ORAL | Status: DC
Start: 2021-07-02 — End: 2021-07-05
  Administered 2021-07-02 – 2021-07-04 (×3): 10 mg via ORAL
  Filled 2021-07-01 (×3): qty 1

## 2021-07-01 MED ORDER — INSULIN GLARGINE 100 UNIT/ML SOLOSTAR PEN: 8.0000 [IU] | PEN_INJECTOR | Freq: Every day | SUBCUTANEOUS | Status: DC

## 2021-07-01 MED ORDER — FUROSEMIDE 40 MG PO TABS
40.0000 mg | ORAL_TABLET | Freq: Every day | ORAL | Status: DC
  Administered 2021-07-02 – 2021-07-04 (×3): 40 mg via ORAL
  Filled 2021-07-01 (×3): qty 1

## 2021-07-01 MED ORDER — FUROSEMIDE 10 MG/ML IJ SOLN
40.0000 mg | Freq: Once | INTRAMUSCULAR | Status: AC
  Administered 2021-07-01: 40 mg via INTRAVENOUS
  Filled 2021-07-01: qty 4

## 2021-07-01 MED ORDER — FUROSEMIDE 40 MG PO TABS
80.0000 mg | ORAL_TABLET | Freq: Every morning | ORAL | Status: DC
  Administered 2021-07-02 – 2021-07-04 (×3): 80 mg via ORAL
  Filled 2021-07-01 (×3): qty 2

## 2021-07-01 MED ORDER — SODIUM CHLORIDE 0.9 % IV SOLN
10.0000 mL/h | Freq: Once | INTRAVENOUS | Status: AC
  Administered 2021-07-01: 10 mL/h via INTRAVENOUS

## 2021-07-01 NOTE — ED Notes (Signed)
Pt refuses to wear monitor

## 2021-07-01 NOTE — H&P (Addendum)
History and Physical    Jackie Wade GHW:[id number] DOB: 1946/10/22 DOA: 07/01/2021  PCP: Lucianne Lei, MD  Patient coming from: Home  I have personally briefly reviewed patient's old medical records in [facility]  Chief Complaint: CKD  HPI: Jackie Wade is a 75 y.o. female with medical history significant of CKD stage 5, DM2, HTN, anemia of CKD.  Pt sent in to ED by nephrologist.  Pt with leg swelling, generalized weakness, SOB.  Pt with worsening BLE edema over past month or so, fatigue, DOE.  Dr. Candiss Norse had blood work drawn yesterday that showed low HGB and progressively worsening renal failure.  Pt sent in to ED.  No melena, no hematochezia, no blood thinners.   ED Course: Pt given IV lasix, 1u PRBC transfusion ordered.  Plan per Dr. Candiss Norse is to start dialysis tomorrow in hospital.  He is setting up getting her a vas cath for access.   Review of Systems: As per HPI, otherwise all review of systems negative.  Past Medical History:  Diagnosis Date   Chronic kidney disease (CKD), stage IV (severe) (HCC)    Diabetes mellitus without complication ([place])    Renal disorder     Past Surgical History:  Procedure Laterality Date   WRIST SURGERY       reports that she has never smoked. She has never used smokeless tobacco. She reports that she does not drink alcohol and does not use drugs.  No Known Allergies  Family History  Problem Relation Age of Onset   Premature CHD Neg Hx      Prior to Admission medications   Medication Sig Start Date End Date Taking? Authorizing Provider  amLODipine (NORVASC) 10 MG tablet Take 1 tablet (10 mg total) by mouth daily. 06/12/21   Nicole Kindred A, DO  blood glucose meter kit and supplies KIT Dispense based on patient and insurance preference. Use up to four times daily as directed. 06/12/21   Ezekiel Slocumb, DO  Continuous Blood Gluc Receiver (FREESTYLE LIBRE 2 READER) DEVI 1 each by Does not apply route 4 (four) times daily  -  before meals and at bedtime. 06/12/21   Ezekiel Slocumb, DO  Continuous Blood Gluc Sensor (FREESTYLE LIBRE 2 SENSOR) MISC 1 each by Does not apply route every 14 (fourteen) days. 06/12/21   Ezekiel Slocumb, DO  furosemide (LASIX) 40 MG tablet Take 40-80 mg by mouth See admin instructions. 80 mg in the morning, and 40 mg at 4p    [provider]  insulin aspart (NOVOLOG) 100 UNIT/ML FlexPen Inject 0-9 Units into the skin 3 (three) times daily with meals. Take # of units per sliding scale instructions provided 06/12/21   Nicole Kindred A, DO  insulin glargine (LANTUS) 100 UNIT/ML Solostar Pen Inject 8 Units into the skin daily. 06/12/21   Nicole Kindred A, DO  Insulin Pen Needle (PEN NEEDLES) 31G X 6 MM MISC 1 each by Does not apply route as needed. 06/12/21   Nicole Kindred A, DO  JANUVIA 100 MG tablet Take 100 mg by mouth daily. 07/08/20   [provider]  rosuvastatin (CRESTOR) 5 MG tablet Take 1 tablet (5 mg total) by mouth daily. 06/12/21   Ezekiel Slocumb, DO    Physical Exam: Vitals:   07/01/21 1821 07/01/21 2009 07/01/21 2024 07/01/21 2026  BP: (!) 167/80 (!) 180/85 (!) 177/84 (!) 177/84  Pulse: 84 94  93  Resp: '16 15 15 15  ' Temp:  98.1 F (  36.7 C)  98.9 F (37.2 C)  TempSrc:  Oral  Oral  SpO2: 100% 100%  100%  Weight:      Height:        Constitutional: NAD, calm, ill appearing Eyes: PERRL, lids and conjunctivae pale ENMT: Mucous membranes are moist. Posterior pharynx clear of any exudate or lesions.Normal dentition.  Neck: normal, supple, no masses, no thyromegaly Respiratory: Crackles B bases Normal respiratory effort. No accessory muscle use.  Cardiovascular: Regular rate and rhythm, no murmurs / rubs / gallops. 2+ extremity edema. 2+ pedal pulses. No carotid bruits.  Abdomen: no tenderness, no masses palpated. No hepatosplenomegaly. Bowel sounds positive.  Musculoskeletal: no clubbing / cyanosis. No joint deformity upper and lower extremities. Good  ROM, no contractures. Normal muscle tone.  Skin: no rashes, lesions, ulcers. No induration Neurologic: CN 2-12 grossly intact. Sensation intact, DTR normal. Strength 5/5 in all 4.  Psychiatric: Normal judgment and insight. Alert and oriented x 3. Normal mood.    Labs on Admission: I have personally reviewed following labs and imaging studies  CBC: Recent Labs  Lab 07/01/21 1605  WBC 5.9  NEUTROABS 4.9  HGB 6.7*  HCT 19.9*  MCV 85.0  PLT 017   Basic Metabolic Panel: Recent Labs  Lab 07/01/21 1605  NA 131*  K 4.2  CL 99  CO2 22  GLUCOSE 156*  BUN 48*  CREATININE 4.42*  CALCIUM 8.2*   GFR: Estimated Creatinine Clearance: 9.4 mL/min (A) (by C-G formula based on SCr of 4.42 mg/dL (H)). Liver Function Tests: Recent Labs  Lab 07/01/21 1605  AST 31  ALT 20  ALKPHOS 64  BILITOT 0.5  PROT 6.3*  ALBUMIN 2.3*   No results for input(s): LIPASE, AMYLASE in the last 168 hours. No results for input(s): AMMONIA in the last 168 hours. Coagulation Profile: No results for input(s): INR, PROTIME in the last 168 hours. Cardiac Enzymes: No results for input(s): CKTOTAL, CKMB, CKMBINDEX, TROPONINI in the last 168 hours. BNP (last 3 results) No results for input(s): PROBNP in the last 8760 hours. HbA1C: No results for input(s): HGBA1C in the last 72 hours. CBG: No results for input(s): GLUCAP in the last 168 hours. Lipid Profile: No results for input(s): CHOL, HDL, LDLCALC, TRIG, CHOLHDL, LDLDIRECT in the last 72 hours. Thyroid Function Tests: No results for input(s): TSH, T4TOTAL, FREET4, T3FREE, THYROIDAB in the last 72 hours. Anemia Panel: No results for input(s): VITAMINB12, FOLATE, FERRITIN, TIBC, IRON, RETICCTPCT in the last 72 hours. Urine analysis:    Component Value Date/Time   COLORURINE YELLOW 06/09/2021 1329   APPEARANCEUR CLEAR 06/09/2021 1329   LABSPEC 1.015 06/09/2021 1329   PHURINE 5.5 06/09/2021 1329   GLUCOSEU >=500 (A) 06/09/2021 1329   HGBUR MODERATE  (A) 06/09/2021 1329   BILIRUBINUR NEGATIVE 06/09/2021 1329   KETONESUR NEGATIVE 06/09/2021 1329   PROTEINUR 100 (A) 06/09/2021 1329   NITRITE NEGATIVE 06/09/2021 1329   LEUKOCYTESUR NEGATIVE 06/09/2021 1329    Radiological Exams on Admission: DG Chest Port 1 View  Result Date: 07/01/2021 CLINICAL DATA:  Shortness of breath EXAM: PORTABLE CHEST 1 VIEW COMPARISON:  09/09/2015 FINDINGS: Cardiomegaly. No focal opacity or pleural effusion. No pneumothorax. IMPRESSION: Cardiomegaly. Electronically Signed   By: Donavan Foil M.D.   On: 07/01/2021 19:06    EKG: Independently reviewed.  Assessment/Plan Principal Problem:   Symptomatic anemia Active Problems:   Essential hypertension   Anemia due to chronic kidney disease   Type II diabetes mellitus, uncontrolled (HCC)   CKD (  chronic kidney disease) stage 5, GFR less than 15 ml/min (HCC)    Symptomatic anemia due to CKD - Recent anemia pnl just last month suggested that anemia secondary to CKD. Hemoccult neg Pt denies bleeding 1u PRBC transfusion Repeat CBC in AM CKD 5 - Cont BID lasix Plan for vas cath and to start dialysis in hospital NPO after MN See also Dr. Candiss Norse note DM2 - Hold lantus (patient not used in several days due to BGLs being on low side) Cont tradjenda Sensitive SSI Q4H HTN - Cont amlodipine  DVT prophylaxis: SCDs Code Status: Full Family Communication: No family in room Disposition Plan: Home after HGB stable and cleared by nephrology Consults called: Dr. Candiss Norse Admission status: Place in obs     Hugo Lybrand, [facility]  How to contact the [facility] Attending or Consulting provider [place] or covering provider during after hours [place], for this patient?  Check the care team in [facility] and look for a) attending/consulting [facility] provider listed and b) the [facility] team listed Log into [email or web address]  Amion Physician Scheduling and messaging for groups and whole hospitals  On call and physician scheduling  software for group practices, residents, hospitalists and other medical providers for call, clinic, rotation and shift schedules. OnCall [employer] is a hospital-wide system for scheduling doctors and paging doctors on call. EasyPlot is for scientific plotting and data analysis.  [email or web address]  and use [place]'s universal password to access. If you do not have the password, please contact the hospital operator.  Locate the [facility] provider you are looking for under [facility] and page to a number that you can be directly reached. If you still have difficulty reaching the provider, please page the [facility] (Director on Call) for the Hospitalists listed on amion for assistance.  07/01/2021, 9:03 PM

## 2021-07-01 NOTE — ED Notes (Signed)
Patient stated she had her own personal cbg meter and would use that for blood sugar checks

## 2021-07-01 NOTE — ED Triage Notes (Signed)
The pt was seen by her doctor yesterday and did  lab work for feet and leg swelling for several days she also feels lehtargic.  She was called today and told to come to the ed for a blood transfusion and dialysis

## 2021-07-01 NOTE — ED Notes (Signed)
Pt refused to have her sugar check using hospital cbg machine. Stated she's got her own machine to check her cbg. DO Jared gave approval to this.

## 2021-07-01 NOTE — ED Provider Notes (Signed)
[facility] EMERGENCY DEPARTMENT Provider Note   CSN: [id number] Arrival date & time: 07/01/21  1455     History Chief Complaint  Patient presents with   Chronic Kidney Disease    Jackie Wade is a 75 y.o. female hx of CKD, diabetes here presenting with leg swelling and weakness and shortness of breath.  Patient has been having worsening leg swelling over the last month or so.  Patient also felt fatigued and has some subjective shortness of breath when she walks.  She also feels very tired. She has been following with Dr. Candiss Norse from nephrology.  She had blood work drawn yesterday that showed low hemoglobin and progressively worsening renal failure.  She was told that she will need dialysis and fluid taken off and also transfusion.  Patient denies any blood in her stool.  Denies being on any blood thinners  The history is provided by the patient.      Past Medical History:  Diagnosis Date   Chronic kidney disease (CKD), stage IV (severe) (HCC)    Diabetes mellitus without complication ([place])    Renal disorder     Patient Active Problem List   Diagnosis Date Noted   Type II diabetes mellitus, uncontrolled ([place]) 06/28/2021   Screening for malignant neoplasm of colon 06/28/2021   Mixed hyperlipidemia 06/28/2021   Vitamin D deficiency 06/28/2021   Asymmetrical sensorineural hearing loss 06/17/2021   Steroid-induced hyperglycemia 06/10/2021   Hyperlipidemia due to type 2 diabetes mellitus ([place]) 06/10/2021   Anemia due to chronic kidney disease 06/10/2021   FSGS (focal segmental glomerulosclerosis) 06/10/2021   Hypokalemia 06/10/2021   Renal disorder 06/09/2021   Hyperosmolar hyperglycemic state (HHS) ([place]) 06/09/2021   Severe hyperglycemia due to diabetes mellitus ([place])    Hyponatremia    AKI (acute kidney injury) ([place])    Allergic rhinitis 05/17/2021   Bilateral impacted cerumen 05/17/2021   Type 2 diabetes mellitus without complication, without long-term  current use of insulin ([place]) 11/12/2020   Essential hypertension 11/12/2020    Past Surgical History:  Procedure Laterality Date   WRIST SURGERY       OB History   No obstetric history on file.     No family history on file.  Social History   Tobacco Use   Smoking status: Never   Smokeless tobacco: Never  Vaping Use   Vaping Use: Never used  Substance Use Topics   Alcohol use: No   Drug use: No    Home Medications Prior to Admission medications   Medication Sig Start Date End Date Taking? Authorizing Provider  amLODipine (NORVASC) 10 MG tablet Take 1 tablet (10 mg total) by mouth daily. 06/12/21   Nicole Kindred A, DO  blood glucose meter kit and supplies KIT Dispense based on patient and insurance preference. Use up to four times daily as directed. 06/12/21   Ezekiel Slocumb, DO  Continuous Blood Gluc Receiver (FREESTYLE LIBRE 2 READER) DEVI 1 each by Does not apply route 4 (four) times daily -  before meals and at bedtime. 06/12/21   Ezekiel Slocumb, DO  Continuous Blood Gluc Sensor (FREESTYLE LIBRE 2 SENSOR) MISC 1 each by Does not apply route every 14 (fourteen) days. 06/12/21   Ezekiel Slocumb, DO  furosemide (LASIX) 40 MG tablet Take 40-80 mg by mouth See admin instructions. 80 mg in the morning, and 40 mg at 4p    [provider]  insulin aspart (NOVOLOG) 100 UNIT/ML FlexPen Inject 0-9 Units into  the skin 3 (three) times daily with meals. Take # of units per sliding scale instructions provided 06/12/21   Nicole Kindred A, DO  insulin glargine (LANTUS) 100 UNIT/ML Solostar Pen Inject 8 Units into the skin daily. 06/12/21   Nicole Kindred A, DO  Insulin Pen Needle (PEN NEEDLES) 31G X 6 MM MISC 1 each by Does not apply route as needed. 06/12/21   Nicole Kindred A, DO  JANUVIA 100 MG tablet Take 100 mg by mouth daily. 07/08/20   [provider]  rosuvastatin (CRESTOR) 5 MG tablet Take 1 tablet (5 mg total) by mouth daily. 06/12/21   Ezekiel Slocumb, DO     Allergies    Patient has no known allergies.  Review of Systems   Review of Systems  Cardiovascular:  Positive for leg swelling.  Neurological:  Positive for dizziness and weakness.  All other systems reviewed and are negative.  Physical Exam Updated Vital Signs BP (!) 167/80 (BP Location: Right Arm)   Pulse 84   Temp 100.1 F (37.8 C) (Oral)   Resp 16   Ht '5\' 3"'  (1.6 m)   Wt 61.3 kg   SpO2 100%   BMI 23.94 kg/m   Physical Exam Vitals and nursing note reviewed.  Constitutional:      Appearance: Normal appearance.     Comments: Chronically ill   HENT:     Head: Normocephalic.     Nose: Nose normal.     Mouth/Throat:     Mouth: Mucous membranes are moist.  Eyes:     Extraocular Movements: Extraocular movements intact.     Comments: Conjunctiva pale   Cardiovascular:     Rate and Rhythm: Normal rate and regular rhythm.     Pulses: Normal pulses.     Heart sounds: Normal heart sounds.  Pulmonary:     Effort: Pulmonary effort is normal.     Comments: Crackles bilateral bases  Abdominal:     General: Abdomen is flat.     Palpations: Abdomen is soft.  Musculoskeletal:     Cervical back: Normal range of motion and neck supple.     Comments: 2+ edema   Skin:    General: Skin is warm.     Capillary Refill: Capillary refill takes less than 2 seconds.  Neurological:     General: No focal deficit present.     Mental Status: She is alert and oriented to person, place, and time.  Psychiatric:        Mood and Affect: Mood normal.        Behavior: Behavior normal.    ED Results / Procedures / Treatments   Labs (all labs ordered are listed, but only abnormal results are displayed) Labs Reviewed  COMPREHENSIVE METABOLIC PANEL - Abnormal; Notable for the following components:      Result Value   Sodium 131 (*)    Glucose, Bld 156 (*)    BUN 48 (*)    Creatinine, Ser 4.42 (*)    Calcium 8.2 (*)    Total Protein 6.3 (*)    Albumin 2.3 (*)    GFR, Estimated 10  (*)    All other components within normal limits  CBC WITH DIFFERENTIAL/PLATELET - Abnormal; Notable for the following components:   RBC 2.34 (*)    Hemoglobin 6.7 (*)    HCT 19.9 (*)    Lymphs Abs 0.6 (*)    All other components within normal limits  RESP PANEL BY RT-PCR (FLU A&B,  COVID) ARPGX2  VITAMIN B12  FOLATE  IRON AND TIBC  FERRITIN  RETICULOCYTES  POC OCCULT BLOOD, ED  TYPE AND SCREEN  PREPARE RBC (CROSSMATCH)  ABO/RH    EKG EKG Interpretation  Date/Time:  Friday July 01 2021 15:50:24 EDT Ventricular Rate:  90 PR Interval:  144 QRS Duration: 82 QT Interval:  360 QTC Calculation: 440 R Axis:   -30 Text Interpretation: Normal sinus rhythm Left axis deviation Abnormal ECG No significant change since last tracing Confirmed by Wandra Arthurs ([id number]) on 07/01/2021 7:35:38 PM  Radiology DG Chest Port 1 View  Result Date: 07/01/2021 CLINICAL DATA:  Shortness of breath EXAM: PORTABLE CHEST 1 VIEW COMPARISON:  09/09/2015 FINDINGS: Cardiomegaly. No focal opacity or pleural effusion. No pneumothorax. IMPRESSION: Cardiomegaly. Electronically Signed   By: Donavan Foil M.D.   On: 07/01/2021 19:06    Procedures Procedures   CRITICAL CARE Performed by: Wandra Arthurs   Total critical care time: 30 minutes  Critical care time was exclusive of separately billable procedures and treating other patients.  Critical care was necessary to treat or prevent imminent or life-threatening deterioration.  Critical care was time spent personally by me on the following activities: development of treatment plan with patient and/or surrogate as well as nursing, discussions with consultants, evaluation of patient's response to treatment, examination of patient, obtaining history from patient or surrogate, ordering and performing treatments and interventions, ordering and review of laboratory studies, ordering and review of radiographic studies, pulse oximetry and re-evaluation of patient's  condition.   Medications Ordered in ED Medications  0.9 %  sodium chloride infusion (has no administration in time range)  furosemide (LASIX) injection 40 mg (40 mg Intravenous Given 07/01/21 1934)    ED Course  I have reviewed the triage vital signs and the nursing notes.  Pertinent labs & imaging results that were available during my care of the patient were reviewed by me and considered in my medical decision making (see chart for details).    MDM Rules/Calculators/A&P                          Jackie Wade is a 75 y.o. female here presenting with fatigue and weakness and leg swelling.  Patient has progressive worsening renal failure.  Patient was told she is anemic and will need transfusion.  Patient still urinates and appears volume overloaded.  I discussed case with Dr. Caprice Kluver from nephrology.  He agreed with diuresis and transfusion and he will see the patient and arrange for temporary cath.  7:35 PM Patient's guaiac is negative.  Her hemoglobin here is 6.7.  I think likely anemia of chronic disease.  Patient's potassium is 4.2 and creatinine is 4.4.  Patient is not in emergent dialysis.  Will give Lasix and also transfuse 1 unit PRBC.  At this point hospitalist to admit   Final Clinical Impression(s) / ED Diagnoses Final diagnoses:  None    Rx / DC Orders ED Discharge Orders     None        Drenda Freeze, MD 07/01/21 Joen Laura

## 2021-07-01 NOTE — ED Notes (Signed)
PA made aware of Hgb result. Prioritizing pt to go back to tx room.

## 2021-07-01 NOTE — ED Provider Notes (Signed)
Emergency Medicine Provider Triage Evaluation Note  Khristen Cheyney , a 75 y.o. female  was evaluated in triage.  Patient states that she was told to come to the emergency department after seeing her nephrologist Dr. Candiss Norse yesterday.  She states that she was told she needed a blood transfusion and dialysis.  Patient endorses generalized fatigue, shortness of breath, and bilateral leg swelling.  Symptoms have been present for multiple months however having become worse over the last 2 weeks.  Patient denies any abdominal pain, nausea, vomiting, blood in stool, melena, chest pain.  Review of Systems  Positive: Shortness of breath, fatigue, bilateral leg swelling Negative: bdominal pain, nausea, vomiting, blood in stool, melena, chest pain  Physical Exam  BP 137/71 (BP Location: Left Arm)   Pulse 92   Temp 100.1 F (37.8 C) (Oral)   Resp 16   Ht 5\' 3"  (1.6 m)   Wt 61.3 kg   SpO2 100%   BMI 23.94 kg/m  Gen:   Awake, no distress   Resp:  Normal effort, lungs clear to auscultation bilaterally MSK:   Moves extremities without difficulty, +2 edema to bilateral lower extremities Other:  Abdomen soft, nondistended, nontender  Medical Decision Making  Medically screening exam initiated at 4:05 PM.  Appropriate orders placed.  Velecia Ovitt was informed that the remainder of the evaluation will be completed by another provider, this initial triage assessment does not replace that evaluation, and the importance of remaining in the ED until their evaluation is complete.  The patient appears stable so that the remainder of the work up may be completed by another provider.      Loni Beckwith, PA-C 07/01/21 1607    Drenda Freeze, MD 07/01/21 2226

## 2021-07-01 NOTE — Treatment Plan (Signed)
Patient seen by me at our office on 06/30/2021. Has been endorsing feeling cold, excessive fatigue, ongoing dysgeusia. Labs obtained, pertinent results: Cr 4.3, eGFR 10, Na 131, Bicarb 17, K 4.6, Hgb 6.7. Outpatient plan was to start dialysis very soon however given ongoing symptoms and abnormal labs, will need a blood transfusion and will need to start HD sooner rather than later hence we have directed her to the ER.  Gean Quint, MD [facility]

## 2021-07-02 ENCOUNTER — Encounter (HOSPITAL_COMMUNITY): Payer: Self-pay | Admitting: Internal Medicine

## 2021-07-02 DIAGNOSIS — I129 Hypertensive chronic kidney disease with stage 1 through stage 4 chronic kidney disease, or unspecified chronic kidney disease: Secondary | ICD-10-CM | POA: Diagnosis not present

## 2021-07-02 DIAGNOSIS — N184 Chronic kidney disease, stage 4 (severe): Secondary | ICD-10-CM | POA: Diagnosis not present

## 2021-07-02 DIAGNOSIS — E871 Hypo-osmolality and hyponatremia: Secondary | ICD-10-CM | POA: Diagnosis not present

## 2021-07-02 DIAGNOSIS — N179 Acute kidney failure, unspecified: Secondary | ICD-10-CM | POA: Diagnosis not present

## 2021-07-02 DIAGNOSIS — D649 Anemia, unspecified: Secondary | ICD-10-CM | POA: Diagnosis not present

## 2021-07-02 DIAGNOSIS — I10 Essential (primary) hypertension: Secondary | ICD-10-CM | POA: Diagnosis not present

## 2021-07-02 LAB — GLUCOSE, CAPILLARY
Glucose-Capillary: 90 mg/dL (ref 70–99)
Glucose-Capillary: 78 mg/dL (ref 70–99)
Glucose-Capillary: 97 mg/dL (ref 70–99)
Glucose-Capillary: 112 mg/dL — ABNORMAL HIGH (ref 70–99)

## 2021-07-02 LAB — BASIC METABOLIC PANEL
Anion gap: 9 (ref 5–15)
BUN: 46 mg/dL — ABNORMAL HIGH (ref 8–23)
CO2: 19 mmol/L — ABNORMAL LOW (ref 22–32)
Calcium: 7.8 mg/dL — ABNORMAL LOW (ref 8.9–10.3)
Chloride: 105 mmol/L (ref 98–111)
Creatinine, Ser: 4.1 mg/dL — ABNORMAL HIGH (ref 0.44–1.00)
GFR, Estimated: 11 mL/min — ABNORMAL LOW (ref >60)
Glucose, Bld: 84 mg/dL (ref 70–99)
Potassium: 4.3 mmol/L (ref 3.5–5.1)
Sodium: 133 mmol/L — ABNORMAL LOW (ref 135–145)

## 2021-07-02 LAB — HEMOGLOBIN AND HEMATOCRIT, BLOOD
HCT: 31 % — ABNORMAL LOW (ref 36.0–46.0)
Hemoglobin: 10.7 g/dL — ABNORMAL LOW (ref 12.0–15.0)

## 2021-07-02 LAB — CBC
HCT: 21 % — ABNORMAL LOW (ref 36.0–46.0)
Hemoglobin: 7.1 g/dL — ABNORMAL LOW (ref 12.0–15.0)
MCH: 28.2 pg (ref 26.0–34.0)
MCHC: 33.8 g/dL (ref 30.0–36.0)
MCV: 83.3 fL (ref 80.0–100.0)
Platelets: 294 10*3/uL (ref 150–400)
RBC: 2.52 MIL/uL — ABNORMAL LOW (ref 3.87–5.11)
RDW: 12.8 % (ref 11.5–15.5)
WBC: 5.7 10*3/uL (ref 4.0–10.5)
nRBC: 0 % (ref 0.0–0.2)

## 2021-07-02 LAB — PREPARE RBC (CROSSMATCH)

## 2021-07-02 MED ORDER — DARBEPOETIN ALFA 60 MCG/0.3ML IJ SOSY
60.0000 ug | PREFILLED_SYRINGE | INTRAMUSCULAR | Status: DC
  Administered 2021-07-02: 60 ug via SUBCUTANEOUS
  Filled 2021-07-02 (×2): qty 0.3

## 2021-07-02 MED ORDER — DEXTROSE 50 % IV SOLN
INTRAVENOUS | Status: AC
  Administered 2021-07-02: 25 mL
  Filled 2021-07-02: qty 50

## 2021-07-02 MED ORDER — SODIUM CHLORIDE 0.9% IV SOLUTION: Freq: Once | INTRAVENOUS | Status: AC

## 2021-07-02 MED ORDER — DEXTROSE 50 % IV SOLN
INTRAVENOUS | Status: AC
  Filled 2021-07-02: qty 50

## 2021-07-02 MED ORDER — NEPRO/CARBSTEADY PO LIQD
237.0000 mL | Freq: Two times a day (BID) | ORAL | Status: DC
  Administered 2021-07-02 – 2021-07-03 (×3): 237 mL via ORAL

## 2021-07-02 NOTE — ED Notes (Signed)
Pt ambulatory to BR without assistance.  

## 2021-07-02 NOTE — Consult Note (Signed)
Reason for Consult: Acute kidney injury on chronic kidney disease stage IV, severe anemia Referring Physician: Jennye Boroughs, MD [facility])  HPI:  75 year old African-American woman with past medical history significant for FSGS and type 2 diabetes mellitus with chronic kidney disease stage IV-V at baseline who was admitted with findings of severe symptomatic anemia.  She was recently hospitalized last month for the management of hyperosmolar hyperglycemic state and discharged with downtrending creatinine.  She reports to have been feeling fatigued every morning and has intermittent dysgeusia but has been forcing herself to eat and drink to try and stay on top of her glycemic control.  She reports chills and easy fatigability but denies any nausea, vomiting or diarrhea.  She denies any abnormal limb jerking movements and has not had any overt blood loss including melena, hematochezia or hematemesis.  When seen this morning, she informs me that this is the best she has felt in several weeks after 1 unit of blood transfusion overnight.  She was kept n.p.o overnight anticipating the need for [facility] placement this morning and is hungry.  Past Medical History:  Diagnosis Date   Chronic kidney disease (CKD), stage IV (severe) (HCC)    Diabetes mellitus without complication ([place])    Renal disorder     Past Surgical History:  Procedure Laterality Date   WRIST SURGERY      Family History  Problem Relation Age of Onset   Premature CHD Neg Hx     Social History:  reports that she has never smoked. She has never used smokeless tobacco. She reports that she does not drink alcohol and does not use drugs.  Allergies: No Known Allergies  Medications: I have reviewed the patient's current medications. Scheduled:  amLODipine  10 mg Oral Daily   furosemide  40 mg Oral q1600   furosemide  80 mg Oral q AM   insulin aspart  0-9 Units Subcutaneous Q4H   linagliptin  5 mg Oral Daily   rosuvastatin  5 mg Oral  Daily    BMP Latest Ref Rng & Units 07/02/2021 07/01/2021 06/11/2021  Glucose 70 - 99 mg/dL 84 156(H) 313(H)  BUN 8 - 23 mg/dL 46(H) 48(H) 84(H)  Creatinine 0.44 - 1.00 mg/dL 4.10(H) 4.42(H) 4.42(H)  Sodium 135 - 145 mmol/L 133(L) 131(L) 131(L)  Potassium 3.5 - 5.1 mmol/L 4.3 4.2 4.1  Chloride 98 - 111 mmol/L 105 99 102  CO2 22 - 32 mmol/L 19(L) 22 21(L)  Calcium 8.9 - 10.3 mg/dL 7.8(L) 8.2(L) 8.6(L)   CBC Latest Ref Rng & Units 07/02/2021 07/01/2021 06/09/2021  WBC 4.0 - 10.5 K/uL 5.7 5.9 -  Hemoglobin 12.0 - 15.0 g/dL 7.1(L) 6.7(LL) 9.5(L)  Hematocrit 36.0 - 46.0 % 21.0(L) 19.9(L) 28.0(L)  Platelets 150 - 400 K/uL 294 358 -    DG Chest Port 1 View  Result Date: 07/01/2021 CLINICAL DATA:  Shortness of breath EXAM: PORTABLE CHEST 1 VIEW COMPARISON:  09/09/2015 FINDINGS: Cardiomegaly. No focal opacity or pleural effusion. No pneumothorax. IMPRESSION: Cardiomegaly. Electronically Signed   By: Donavan Foil M.D.   On: 07/01/2021 19:06    Review of Systems  Constitutional:  Positive for appetite change, chills and fatigue. Negative for fever.  HENT:  Negative for nosebleeds, sore throat and trouble swallowing.   Eyes:  Negative for photophobia, redness and visual disturbance.  Respiratory:  Positive for shortness of breath. Negative for chest tightness and wheezing.        Shortness of breath with exertion  Cardiovascular:  Positive  for leg swelling.       Improving edema  Gastrointestinal:  Negative for abdominal pain, diarrhea, nausea and vomiting.       Intermittent dysgeusia associated with certain foods  Endocrine: Positive for cold intolerance. Negative for heat intolerance and polyuria.  Genitourinary:  Negative for dysuria, frequency and urgency.  Musculoskeletal:  Negative for arthralgias and gait problem.  Skin:  Negative for color change and rash.  Neurological:  Positive for weakness. Negative for dizziness and headaches.  Psychiatric/Behavioral:  The patient is nervous/anxious.    Blood pressure 137/65, pulse 81, temperature 99.2 F (37.3 C), temperature source Oral, resp. rate 18, height 5\' 3"  (1.6 m), weight 58.7 kg, SpO2 98 %. Physical Exam Vitals reviewed.  Constitutional:      Appearance: Normal appearance. She is ill-appearing.  HENT:     Head: Normocephalic and atraumatic.     Right Ear: External ear normal.     Left Ear: External ear normal.     Nose: Nose normal.     Mouth/Throat:     Mouth: Mucous membranes are dry.     Pharynx: Oropharynx is clear. No oropharyngeal exudate.  Eyes:     General: No scleral icterus.    Conjunctiva/sclera: Conjunctivae normal.  Cardiovascular:     Rate and Rhythm: Normal rate and regular rhythm.     Pulses: Normal pulses.     Heart sounds: Murmur heard.     Comments: 3/6 ejection systolic murmur Pulmonary:     Effort: Pulmonary effort is normal.     Breath sounds: Normal breath sounds. No wheezing or rales.  Abdominal:     General: Abdomen is flat. There is no distension.     Palpations: Abdomen is soft.     Tenderness: There is no abdominal tenderness.  Musculoskeletal:     Cervical back: Normal range of motion and neck supple. No rigidity.     Right lower leg: Edema present.     Left lower leg: Edema present.     Comments: 1-2+ lower extremity edema  Skin:    General: Skin is warm and dry.  Neurological:     General: No focal deficit present.     Mental Status: She is alert and oriented to person, place, and time.  Psychiatric:        Mood and Affect: Mood normal.   Assessment/Plan: 1.  Acute kidney injury on chronic kidney disease stage IV: Slight improvement of creatinine this morning on her labs suggest that this might have been hemodynamically mediated from her severe/symptomatic anemia.  She does not have any acute electrolyte abnormality, significant volume overload or florid uremic symptoms at this time to prompt initiation of dialysis today.  I will continue efforts at optimizing her hemodynamic  status at this time and request for vein mapping for dialysis access planning.  I will consult vascular surgery for dialysis access placement (which may include tandem placement of dialysis catheter if she develops significant lab abnormalities or florid uremic symptoms).  Continue to avoid NSAIDs and iodinated intravenous contrast.  Avoid morphine for pain control. 2.  Symptomatic anemia: Likely secondary to chronic kidney disease and recent hospitalization.  Transfuse an additional unit of PRBC today.  Recently done iron saturation was within acceptable range, begin ESA today. 3.  Hypertension/volume: She has been resumed back on oral antihypertensive therapy with amlodipine and was started back on furosemide for volume management appropriately.  Blood pressures currently acceptable. 4.  Hyponatremia: Mild and improving with some improvement  of renal function-continue to follow with PRBC transfusion/diuresis. 5.  Nutrition: With low albumin level likely associated with recent hospitalization as well as proteinuric renal disease and advancing chronic kidney disease.  Resume renal diet with oral nutritional supplementation. Khalel Alms K. 07/02/2021, 12:28 PM

## 2021-07-02 NOTE — Progress Notes (Signed)
During shift change, patient check her blood glucose with per personal machine and it read 59, she was previously treated with 12.5 gm of D50 for cbg of 60 using her home monitor. RN tried to asked her about using hospital machine to varied reading but patient said her doctor told her she didn't have to and that her machine was very acuate. RN asked another nurse to room to explained again that we want to varied her sugar before we treat, patient agrees for [place] to checked. CBG on our machine read 90, when patient check again with her machine it read 54. Her machine battery is also reading low, patient educated that we will have to check her sugar using hospital machine.

## 2021-07-02 NOTE — Progress Notes (Signed)
Hypoglycemic Event  CBG: 60  Treatment: D50 25 mL (12.5 gm)  Symptoms: Shaky  Follow-up CBG: Time: 1601 CBG Result: 125  Possible Reasons for Event: Other: NPO

## 2021-07-02 NOTE — Progress Notes (Signed)
Progress Note    Jackie Wade  ZLD:[id number] DOB: 1946/08/10  DOA: 07/01/2021 PCP: Lucianne Lei, MD      Brief Narrative:    Medical records reviewed and are as summarized below:  Jackie Wade is a 74 y.o. female with past medical history significant for CKD stage V, type 2 diabetes mellitus, hypertension, anemia of chronic kidney disease, who was referred to the emergency room by her nephrologist because of low hemoglobin and progressive renal failure.  She complained of increasing leg swelling, fatigue, shortness of breath and generalized weakness.  Her hemoglobin was 6.7 in the emergency room.  She was admitted to the hospital for severe anemia, AKI on CKD stage IV.  She was transfused with packed red blood cells.    Assessment/Plan:   Principal Problem:   Symptomatic anemia Active Problems:   Essential hypertension   Anemia due to chronic kidney disease   Type II diabetes mellitus, uncontrolled (HCC)   CKD (chronic kidney disease) stage 5, GFR less than 15 ml/min (HCC)   Body mass index is 22.92 kg/m.   Severe symptomatic anemia: Hemoglobin slightly improved but still low (7.1).  S/p transfusion with 1 unit of packed red blood cells.  She is scheduled to receive another unit of packed red blood cells today.  AKI on CKD stage IV, peripheral edema: Creatinine is slowly trending down.  Per nephrologist, no indication for immediate hemodialysis.  Monitor BMP and follow-up with nephrologist for further recommendations.  Hypertension: Continue amlodipine  Type II DM: She is on linagliptin.  NovoLog as needed for hyperglycemia.  Hyponatremia: Improving.     Diet Order             Diet NPO time specified  Diet effective midnight                      Consultants: Nephrologist  Procedures: None    Medications:    amLODipine  10 mg Oral Daily   furosemide  40 mg Oral q1600   furosemide  80 mg Oral q AM   insulin aspart  0-9 Units  Subcutaneous Q4H   linagliptin  5 mg Oral Daily   rosuvastatin  5 mg Oral Daily   Continuous Infusions:   Anti-infectives (From admission, onward)    None              Family Communication/Anticipated D/C date and plan/Code Status   DVT prophylaxis: SCDs Start: 07/01/21 1943     Code Status: Full Code  Family Communication: None Disposition Plan:    Status is: Observation  The patient will require care spanning > 2 midnights and should be moved to inpatient because: IV treatments appropriate due to intensity of illness or inability to take PO and Inpatient level of care appropriate due to severity of illness  Dispo: The patient is from: Home              Anticipated d/c is to: Home              Patient currently is not medically stable to d/c.   Difficult to place patient No           Subjective:   C/o leg swelling, fatigue and shortness of breath.  She feels a little better today.  Objective:    Vitals:   07/02/21 0000 07/02/21 0130 07/02/21 0424 07/02/21 0829  BP: (!) 162/73 (!) 160/78 (!) 149/64 (!) 153/79  Pulse: 88 88 83 81  Resp: 18 17 16 18   Temp:  99.3 F (37.4 C) 99.1 F (37.3 C) 99.2 F (37.3 C)  TempSrc:  Oral Oral Oral  SpO2: 98% 100% 93% 98%  Weight:  58.7 kg    Height:       No data found.  No intake or output data in the 24 hours ending 07/02/21 0854 Filed Weights   07/01/21 1559 07/02/21 0130  Weight: 61.3 kg 58.7 kg    Exam:  GEN: NAD SKIN: Warm and dry EYES: No pallor or icterus ENT: MMM CV: RRR PULM: CTA B ABD: soft, ND, NT, +BS CNS: AAO x 3, non focal EXT: B/l leg edema, no tenderness        Data Reviewed:   I have personally reviewed following labs and imaging studies:  Labs: Labs show the following:   Basic Metabolic Panel: Recent Labs  Lab 07/01/21 1605 07/02/21 0345  NA 131* 133*  K 4.2 4.3  CL 99 105  CO2 22 19*  GLUCOSE 156* 84  BUN 48* 46*  CREATININE 4.42* 4.10*  CALCIUM 8.2*  7.8*   GFR Estimated Creatinine Clearance: 10.1 mL/min (A) (by C-G formula based on SCr of 4.1 mg/dL (H)). Liver Function Tests: Recent Labs  Lab 07/01/21 1605  AST 31  ALT 20  ALKPHOS 64  BILITOT 0.5  PROT 6.3*  ALBUMIN 2.3*   No results for input(s): LIPASE, AMYLASE in the last 168 hours. No results for input(s): AMMONIA in the last 168 hours. Coagulation profile No results for input(s): INR, PROTIME in the last 168 hours.  CBC: Recent Labs  Lab 07/01/21 1605 07/02/21 0345  WBC 5.9 5.7  NEUTROABS 4.9  --   HGB 6.7* 7.1*  HCT 19.9* 21.0*  MCV 85.0 83.3  PLT 358 294   Cardiac Enzymes: No results for input(s): CKTOTAL, CKMB, CKMBINDEX, TROPONINI in the last 168 hours. BNP (last 3 results) No results for input(s): PROBNP in the last 8760 hours. CBG: Recent Labs  Lab 07/02/21 0742  GLUCAP 90   D-Dimer: No results for input(s): DDIMER in the last 72 hours. Hgb A1c: No results for input(s): HGBA1C in the last 72 hours. Lipid Profile: No results for input(s): CHOL, HDL, LDLCALC, TRIG, CHOLHDL, LDLDIRECT in the last 72 hours. Thyroid function studies: No results for input(s): TSH, T4TOTAL, T3FREE, THYROIDAB in the last 72 hours.  Invalid input(s): FREET3 Anemia work up: No results for input(s): VITAMINB12, FOLATE, FERRITIN, TIBC, IRON, RETICCTPCT in the last 72 hours. Sepsis Labs: Recent Labs  Lab 07/01/21 1605 07/02/21 0345  WBC 5.9 5.7    Microbiology Recent Results (from the past 240 hour(s))  Resp Panel by RT-PCR (Flu A&B, Covid) Nasopharyngeal Swab     Status: None   Collection Time: 07/01/21  6:57 PM   Specimen: Nasopharyngeal Swab; Nasopharyngeal(NP) swabs in vial transport medium  Result Value Ref Range Status   SARS Coronavirus 2 by RT PCR NEGATIVE NEGATIVE Final    Comment: (NOTE) SARS-CoV-2 target nucleic acids are NOT DETECTED.  The SARS-CoV-2 RNA is generally detectable in upper respiratory specimens during the acute phase of infection.  The lowest concentration of SARS-CoV-2 viral copies this assay can detect is 138 copies/mL. A negative result does not preclude SARS-Cov-2 infection and should not be used as the sole basis for treatment or other patient management decisions. A negative result may occur with  improper specimen collection/handling, submission of specimen other than nasopharyngeal swab, presence of viral mutation(s) within the areas targeted by this  assay, and inadequate number of viral copies(<138 copies/mL). A negative result must be combined with clinical observations, patient history, and epidemiological information. The expected result is Negative.  Fact Sheet for Patients:  EntrepreneurPulse.com.au  Fact Sheet for Healthcare Providers:  IncredibleEmployment.be  This test is no t yet approved or cleared by the [place] FDA and  has been authorized for detection and/or diagnosis of SARS-CoV-2 by FDA under an Emergency Use Authorization (EUA). This EUA will remain  in effect (meaning this test can be used) for the duration of the COVID-19 declaration under Section 564(b)(1) of the Act, 21 U.S.C.section 360bbb-3(b)(1), unless the authorization is terminated  or revoked sooner.       Influenza A by PCR NEGATIVE NEGATIVE Final   Influenza B by PCR NEGATIVE NEGATIVE Final    Comment: (NOTE) The Xpert Xpress SARS-CoV-2/FLU/RSV plus assay is intended as an aid in the diagnosis of influenza from Nasopharyngeal swab specimens and should not be used as a sole basis for treatment. Nasal washings and aspirates are unacceptable for Xpert Xpress SARS-CoV-2/FLU/RSV testing.  Fact Sheet for Patients: EntrepreneurPulse.com.au  Fact Sheet for Healthcare Providers: IncredibleEmployment.be  This test is not yet approved or cleared by the [place] FDA and has been authorized for detection and/or diagnosis of SARS-CoV-2 by FDA  under an Emergency Use Authorization (EUA). This EUA will remain in effect (meaning this test can be used) for the duration of the COVID-19 declaration under Section 564(b)(1) of the Act, 21 U.S.C. section 360bbb-3(b)(1), unless the authorization is terminated or revoked.  Performed at [facility], [place] [address], [place], [place] [address]     Procedures and diagnostic studies:  DG Chest Port 1 View  Result Date: 07/01/2021 CLINICAL DATA:  Shortness of breath EXAM: PORTABLE CHEST 1 VIEW COMPARISON:  09/09/2015 FINDINGS: Cardiomegaly. No focal opacity or pleural effusion. No pneumothorax. IMPRESSION: Cardiomegaly. Electronically Signed   By: Donavan Foil M.D.   On: 07/01/2021 19:06               LOS: 0 days   Brallan Denio  [facility]   Pager on [email or web address]. If 7PM-7AM, please contact night-coverage at [email or web address]     07/02/2021, 8:54 AM

## 2021-07-03 ENCOUNTER — Observation Stay (HOSPITAL_COMMUNITY): Payer: Medicare Other

## 2021-07-03 DIAGNOSIS — N185 Chronic kidney disease, stage 5: Secondary | ICD-10-CM | POA: Diagnosis present

## 2021-07-03 DIAGNOSIS — R600 Localized edema: Secondary | ICD-10-CM | POA: Diagnosis present

## 2021-07-03 DIAGNOSIS — N184 Chronic kidney disease, stage 4 (severe): Secondary | ICD-10-CM | POA: Diagnosis not present

## 2021-07-03 DIAGNOSIS — N186 End stage renal disease: Secondary | ICD-10-CM

## 2021-07-03 DIAGNOSIS — I120 Hypertensive chronic kidney disease with stage 5 chronic kidney disease or end stage renal disease: Secondary | ICD-10-CM | POA: Diagnosis present

## 2021-07-03 DIAGNOSIS — Z794 Long term (current) use of insulin: Secondary | ICD-10-CM | POA: Diagnosis not present

## 2021-07-03 DIAGNOSIS — I10 Essential (primary) hypertension: Secondary | ICD-10-CM | POA: Diagnosis not present

## 2021-07-03 DIAGNOSIS — E1165 Type 2 diabetes mellitus with hyperglycemia: Secondary | ICD-10-CM | POA: Diagnosis present

## 2021-07-03 DIAGNOSIS — Z79899 Other long term (current) drug therapy: Secondary | ICD-10-CM | POA: Diagnosis not present

## 2021-07-03 DIAGNOSIS — E871 Hypo-osmolality and hyponatremia: Secondary | ICD-10-CM | POA: Diagnosis present

## 2021-07-03 DIAGNOSIS — Z7984 Long term (current) use of oral hypoglycemic drugs: Secondary | ICD-10-CM | POA: Diagnosis not present

## 2021-07-03 DIAGNOSIS — N179 Acute kidney failure, unspecified: Secondary | ICD-10-CM | POA: Diagnosis present

## 2021-07-03 DIAGNOSIS — E1122 Type 2 diabetes mellitus with diabetic chronic kidney disease: Secondary | ICD-10-CM | POA: Diagnosis present

## 2021-07-03 DIAGNOSIS — D631 Anemia in chronic kidney disease: Secondary | ICD-10-CM | POA: Diagnosis present

## 2021-07-03 DIAGNOSIS — D649 Anemia, unspecified: Secondary | ICD-10-CM | POA: Diagnosis present

## 2021-07-03 DIAGNOSIS — R432 Parageusia: Secondary | ICD-10-CM | POA: Diagnosis present

## 2021-07-03 DIAGNOSIS — Z20822 Contact with and (suspected) exposure to covid-19: Secondary | ICD-10-CM | POA: Diagnosis present

## 2021-07-03 LAB — RENAL FUNCTION PANEL
Albumin: 1.7 g/dL — ABNORMAL LOW (ref 3.5–5.0)
Anion gap: 10 (ref 5–15)
BUN: 46 mg/dL — ABNORMAL HIGH (ref 8–23)
CO2: 19 mmol/L — ABNORMAL LOW (ref 22–32)
Calcium: 7.8 mg/dL — ABNORMAL LOW (ref 8.9–10.3)
Chloride: 103 mmol/L (ref 98–111)
Creatinine, Ser: 3.88 mg/dL — ABNORMAL HIGH (ref 0.44–1.00)
GFR, Estimated: 12 mL/min — ABNORMAL LOW (ref >60)
Glucose, Bld: 93 mg/dL (ref 70–99)
Phosphorus: 4 mg/dL (ref 2.5–4.6)
Potassium: 3.9 mmol/L (ref 3.5–5.1)
Sodium: 132 mmol/L — ABNORMAL LOW (ref 135–145)

## 2021-07-03 LAB — GLUCOSE, CAPILLARY
Glucose-Capillary: 102 mg/dL — ABNORMAL HIGH (ref 70–99)
Glucose-Capillary: 122 mg/dL — ABNORMAL HIGH (ref 70–99)
Glucose-Capillary: 126 mg/dL — ABNORMAL HIGH (ref 70–99)
Glucose-Capillary: 181 mg/dL — ABNORMAL HIGH (ref 70–99)
Glucose-Capillary: 80 mg/dL (ref 70–99)
Glucose-Capillary: 86 mg/dL (ref 70–99)

## 2021-07-03 LAB — CBC
HCT: 26.6 % — ABNORMAL LOW (ref 36.0–46.0)
Hemoglobin: 9 g/dL — ABNORMAL LOW (ref 12.0–15.0)
MCH: 28.2 pg (ref 26.0–34.0)
MCHC: 33.8 g/dL (ref 30.0–36.0)
MCV: 83.4 fL (ref 80.0–100.0)
Platelets: 345 10*3/uL (ref 150–400)
RBC: 3.19 MIL/uL — ABNORMAL LOW (ref 3.87–5.11)
RDW: 12.7 % (ref 11.5–15.5)
WBC: 6.3 10*3/uL (ref 4.0–10.5)
nRBC: 0 % (ref 0.0–0.2)

## 2021-07-03 LAB — MAGNESIUM: Magnesium: 1.8 mg/dL (ref 1.7–2.4)

## 2021-07-03 NOTE — Plan of Care (Signed)

## 2021-07-03 NOTE — Plan of Care (Signed)

## 2021-07-03 NOTE — Progress Notes (Signed)
Progress Note    Jackie Wade  PJA:[id number] DOB: 12-14-1945  DOA: 07/01/2021 PCP: Lucianne Lei, MD      Brief Narrative:    Medical records reviewed and are as summarized below:  Jackie Wade is a 75 y.o. female with past medical history significant for CKD stage V, type 2 diabetes mellitus, hypertension, anemia of chronic kidney disease, who was referred to the emergency room by her nephrologist because of low hemoglobin and progressive renal failure.  She complained of increasing leg swelling, fatigue, shortness of breath and generalized weakness.  Her hemoglobin was 6.7 in the emergency room.  She was admitted to the hospital for severe anemia, AKI on CKD stage IV.  She was transfused with packed red blood cells.    Assessment/Plan:   Principal Problem:   Symptomatic anemia Active Problems:   Essential hypertension   Anemia due to chronic kidney disease   Type II diabetes mellitus, uncontrolled (HCC)   CKD (chronic kidney disease) stage 5, GFR less than 15 ml/min (HCC)   Body mass index is 22.81 kg/m.   Severe symptomatic anemia: H&H improved.  S/p transfusion with 2 units of PRBCs.  AKI on CKD stage IV, peripheral edema: Creatinine is slowly improving.  Per nephrologist, no indication for immediate hemodialysis.  Monitor BMP and follow-up with nephrologist for further recommendations. Plan for placement of dialysis access tomorrow per vascular surgeon.  Hypertension: Continue amlodipine  Type II DM: She is on linagliptin.  NovoLog as needed for hyperglycemia.  Hyponatremia: Sodium level is better.  Asymptomatic.     Diet Order             Diet renal with fluid restriction Fluid restriction: 1500 mL Fluid; Room service appropriate? Yes; Fluid consistency: Thin  Diet effective now                      Consultants: Nephrologist  Procedures: None    Medications:    amLODipine  10 mg Oral Daily   darbepoetin (ARANESP) injection -  NON-DIALYSIS  60 mcg Subcutaneous Q Sat-1800   feeding supplement (NEPRO CARB STEADY)  237 mL Oral BID BM   furosemide  40 mg Oral q1600   furosemide  80 mg Oral q AM   insulin aspart  0-9 Units Subcutaneous Q4H   linagliptin  5 mg Oral Daily   rosuvastatin  5 mg Oral Daily   Continuous Infusions:   Anti-infectives (From admission, onward)    None              Family Communication/Anticipated D/C date and plan/Code Status   DVT prophylaxis: SCDs Start: 07/01/21 1943     Code Status: Full Code  Family Communication: None Disposition Plan:    Status is: Observation  The patient will require care spanning > 2 midnights and should be moved to inpatient because: IV treatments appropriate due to intensity of illness or inability to take PO and Inpatient level of care appropriate due to severity of illness  Dispo: The patient is from: Home              Anticipated d/c is to: Home              Patient currently is not medically stable to d/c.   Difficult to place patient No           Subjective:   Interval events noted.  She has no complaints.  No shortness of breath or chest pain.  Objective:    Vitals:   07/03/21 0344 07/03/21 0500 07/03/21 0730 07/03/21 1110  BP: (!) 142/66   (!) 150/73  Pulse: 84   83  Resp: 16 (!) 26  16  Temp: 99.2 F (37.3 C)   98 F (36.7 C)  TempSrc: Oral     SpO2: 93%  95% 99%  Weight: 58.4 kg     Height:       No data found.   Intake/Output Summary (Last 24 hours) at 07/03/2021 1349 Last data filed at 07/03/2021 0900 Gross per 24 hour  Intake 1100 ml  Output --  Net 1100 ml   Filed Weights   07/01/21 1559 07/02/21 0130 07/03/21 0344  Weight: 61.3 kg 58.7 kg 58.4 kg    Exam:  GEN: NAD SKIN: No rash EYES: EOMI ENT: MMM CV: RRR PULM: CTA B ABD: soft, ND, NT, +BS CNS: AAO x 3, non focal EXT: Bilateral leg edema has improved.  No erythema or tenderness       Data Reviewed:   I have personally  reviewed following labs and imaging studies:  Labs: Labs show the following:   Basic Metabolic Panel: Recent Labs  Lab 07/01/21 1605 07/02/21 0345 07/03/21 0336  NA 131* 133* 132*  K 4.2 4.3 3.9  CL 99 105 103  CO2 22 19* 19*  GLUCOSE 156* 84 93  BUN 48* 46* 46*  CREATININE 4.42* 4.10* 3.88*  CALCIUM 8.2* 7.8* 7.8*  MG  --   --  1.8  PHOS  --   --  4.0   GFR Estimated Creatinine Clearance: 10.7 mL/min (A) (by C-G formula based on SCr of 3.88 mg/dL (H)). Liver Function Tests: Recent Labs  Lab 07/01/21 1605 07/03/21 0336  AST 31  --   ALT 20  --   ALKPHOS 64  --   BILITOT 0.5  --   PROT 6.3*  --   ALBUMIN 2.3* 1.7*   No results for input(s): LIPASE, AMYLASE in the last 168 hours. No results for input(s): AMMONIA in the last 168 hours. Coagulation profile No results for input(s): INR, PROTIME in the last 168 hours.  CBC: Recent Labs  Lab 07/01/21 1605 07/02/21 0345 07/02/21 1957 07/03/21 0336  WBC 5.9 5.7  --  6.3  NEUTROABS 4.9  --   --   --   HGB 6.7* 7.1* 10.7* 9.0*  HCT 19.9* 21.0* 31.0* 26.6*  MCV 85.0 83.3  --  83.4  PLT 358 294  --  345   Cardiac Enzymes: No results for input(s): CKTOTAL, CKMB, CKMBINDEX, TROPONINI in the last 168 hours. BNP (last 3 results) No results for input(s): PROBNP in the last 8760 hours. CBG: Recent Labs  Lab 07/02/21 1950 07/03/21 0020 07/03/21 0341 07/03/21 0719 07/03/21 1109  GLUCAP 112* 126* 86 80 102*   D-Dimer: No results for input(s): DDIMER in the last 72 hours. Hgb A1c: No results for input(s): HGBA1C in the last 72 hours. Lipid Profile: No results for input(s): CHOL, HDL, LDLCALC, TRIG, CHOLHDL, LDLDIRECT in the last 72 hours. Thyroid function studies: No results for input(s): TSH, T4TOTAL, T3FREE, THYROIDAB in the last 72 hours.  Invalid input(s): FREET3 Anemia work up: No results for input(s): VITAMINB12, FOLATE, FERRITIN, TIBC, IRON, RETICCTPCT in the last 72 hours. Sepsis Labs: Recent Labs   Lab 07/01/21 1605 07/02/21 0345 07/03/21 0336  WBC 5.9 5.7 6.3    Microbiology Recent Results (from the past 240 hour(s))  Resp Panel by RT-PCR (Flu A&B,  Covid) Nasopharyngeal Swab     Status: None   Collection Time: 07/01/21  6:57 PM   Specimen: Nasopharyngeal Swab; Nasopharyngeal(NP) swabs in vial transport medium  Result Value Ref Range Status   SARS Coronavirus 2 by RT PCR NEGATIVE NEGATIVE Final    Comment: (NOTE) SARS-CoV-2 target nucleic acids are NOT DETECTED.  The SARS-CoV-2 RNA is generally detectable in upper respiratory specimens during the acute phase of infection. The lowest concentration of SARS-CoV-2 viral copies this assay can detect is 138 copies/mL. A negative result does not preclude SARS-Cov-2 infection and should not be used as the sole basis for treatment or other patient management decisions. A negative result may occur with  improper specimen collection/handling, submission of specimen other than nasopharyngeal swab, presence of viral mutation(s) within the areas targeted by this assay, and inadequate number of viral copies(<138 copies/mL). A negative result must be combined with clinical observations, patient history, and epidemiological information. The expected result is Negative.  Fact Sheet for Patients:  EntrepreneurPulse.com.au  Fact Sheet for Healthcare Providers:  IncredibleEmployment.be  This test is no t yet approved or cleared by the [place] FDA and  has been authorized for detection and/or diagnosis of SARS-CoV-2 by FDA under an Emergency Use Authorization (EUA). This EUA will remain  in effect (meaning this test can be used) for the duration of the COVID-19 declaration under Section 564(b)(1) of the Act, 21 U.S.C.section 360bbb-3(b)(1), unless the authorization is terminated  or revoked sooner.       Influenza A by PCR NEGATIVE NEGATIVE Final   Influenza B by PCR NEGATIVE NEGATIVE Final     Comment: (NOTE) The Xpert Xpress SARS-CoV-2/FLU/RSV plus assay is intended as an aid in the diagnosis of influenza from Nasopharyngeal swab specimens and should not be used as a sole basis for treatment. Nasal washings and aspirates are unacceptable for Xpert Xpress SARS-CoV-2/FLU/RSV testing.  Fact Sheet for Patients: EntrepreneurPulse.com.au  Fact Sheet for Healthcare Providers: IncredibleEmployment.be  This test is not yet approved or cleared by the [place] FDA and has been authorized for detection and/or diagnosis of SARS-CoV-2 by FDA under an Emergency Use Authorization (EUA). This EUA will remain in effect (meaning this test can be used) for the duration of the COVID-19 declaration under Section 564(b)(1) of the Act, 21 U.S.C. section 360bbb-3(b)(1), unless the authorization is terminated or revoked.  Performed at [facility], [place] [address], [place], [place] [address]     Procedures and diagnostic studies:  DG Chest Port 1 View  Result Date: 07/01/2021 CLINICAL DATA:  Shortness of breath EXAM: PORTABLE CHEST 1 VIEW COMPARISON:  09/09/2015 FINDINGS: Cardiomegaly. No focal opacity or pleural effusion. No pneumothorax. IMPRESSION: Cardiomegaly. Electronically Signed   By: Donavan Foil M.D.   On: 07/01/2021 19:06               LOS: 0 days   Kyel Purk  [facility]   Pager on [email or web address]. If 7PM-7AM, please contact night-coverage at [email or web address]     07/03/2021, 1:49 PM

## 2021-07-03 NOTE — Consult Note (Signed)
[facility] [place]  ASSESSMENT / PLAN: 75 y.o. female with AKI on CKD 4.  She is likely to progress to dialysis in the near future.  We will plan to create left-sided dialysis access for her.  Have updated vein mapping order to stat.  Please keep her n.p.o. after midnight.  One of my partners will likely be able to do this tomorrow afternoon.  CHIEF COMPLAINT: Fatigue  HISTORY OF PRESENT ILLNESS: Jackie Wade is a 75 y.o. female admitted to the medicine service for symptomatic anemia in the setting of chronic kidney disease.  She reports progressive symptoms of uremia over the past several weeks including fatigue and dysgeusia.  She was found to be quite anemic as an outpatient and admitted for transfusion.  She has had significant symptomatic relief from 2 units of packed red blood cells.  She had an acute kidney injury on presentation.  Fortunately this is improved with transfusion.  It is felt she will likely progress to dialysis in the near future, but does not need urgent dialysis access.  She has a history of ORIF to the left wrist.  No history of pacemakers or central venous catheters.  She is right-handed.  Past Medical History:  Diagnosis Date   Chronic kidney disease (CKD), stage IV (severe) (HCC)    Diabetes mellitus without complication ([place])    Renal disorder     Past Surgical History:  Procedure Laterality Date   WRIST SURGERY      Family History  Problem Relation Age of Onset   Premature CHD Neg Hx     Social History   Socioeconomic History   Marital status: Married    Spouse name: Not on file   Number of children: Not on file   Years of education: Not on file   Highest education level: Not on file  Occupational History   Not on file  Tobacco Use   Smoking status: Never   Smokeless tobacco: Never  Vaping Use   Vaping Use: Never used  Substance and Sexual Activity   Alcohol use: No   Drug use: No   Sexual activity: Not on file   Other Topics Concern   Not on file  Social History Narrative   Not on file   Social Determinants of Health   Financial Resource Strain: Not on file  Food Insecurity: Not on file  Transportation Needs: Not on file  Physical Activity: Not on file  Stress: Not on file  Social Connections: Not on file  Intimate Partner Violence: Not on file    No Known Allergies  Current Facility-Administered Medications  Medication Dose Route Frequency Provider Last Rate Last Admin   acetaminophen (TYLENOL) tablet 650 mg  650 mg Oral Q6H PRN Etta Quill, DO       Or   acetaminophen (TYLENOL) suppository 650 mg  650 mg Rectal Q6H PRN Etta Quill, DO       amLODipine (NORVASC) tablet 10 mg  10 mg Oral Daily Jennette Kettle M, DO   10 mg at 07/03/21 1191   Darbepoetin Alfa (ARANESP) injection 60 mcg  60 mcg Subcutaneous Q Sat-1800 Elmarie Shiley, MD   60 mcg at 07/02/21 2232   feeding supplement (NEPRO CARB STEADY) liquid 237 mL  237 mL Oral BID BM Elmarie Shiley, MD   237 mL at 07/03/21 0917   furosemide (LASIX) tablet 40 mg  40 mg Oral q1600 Etta Quill, DO   40 mg at 07/02/21  1726   furosemide (LASIX) tablet 80 mg  80 mg Oral q AM Etta Quill, DO   80 mg at 07/03/21 0732   insulin aspart (novoLOG) injection 0-9 Units  0-9 Units Subcutaneous Q4H Etta Quill, DO   1 Units at 07/03/21 0114   linagliptin (TRADJENTA) tablet 5 mg  5 mg Oral Daily Jennette Kettle M, DO   5 mg at 07/03/21 9798   ondansetron (ZOFRAN) tablet 4 mg  4 mg Oral Q6H PRN Etta Quill, DO       Or   ondansetron [facility]) injection 4 mg  4 mg Intravenous Q6H PRN Etta Quill, DO       rosuvastatin (CRESTOR) tablet 5 mg  5 mg Oral Daily Alcario Drought, Jared M, DO   5 mg at 07/03/21 9211    REVIEW OF SYSTEMS:  [X]  denotes positive finding, [ ]  denotes negative finding Cardiac  Comments:  Chest pain or chest pressure:    Shortness of breath upon exertion:    Short of breath when lying flat:    Irregular heart  rhythm:        Vascular    Pain in calf, thigh, or hip brought on by ambulation:    Pain in feet at night that wakes you up from your sleep:     Blood clot in your veins:    Leg swelling:         Pulmonary    Oxygen at home:    Productive cough:     Wheezing:         Neurologic    Sudden weakness in arms or legs:     Sudden numbness in arms or legs:     Sudden onset of difficulty speaking or slurred speech:    Temporary loss of vision in one eye:     Problems with dizziness:         Gastrointestinal    Blood in stool:     Vomited blood:         Genitourinary    Burning when urinating:     Blood in urine:        Psychiatric    Major depression:         Hematologic    Bleeding problems:    Problems with blood clotting too easily:        Skin    Rashes or ulcers:        Constitutional    Fever or chills:      PHYSICAL EXAM Vitals:   07/02/21 1950 07/03/21 0344 07/03/21 0500 07/03/21 0730  BP: (!) 152/74 (!) 142/66    Pulse: 88 84    Resp: 16 16 (!) 26   Temp: 98.1 F (36.7 C) 99.2 F (37.3 C)    TempSrc: Oral Oral    SpO2: 93% 93%  95%  Weight:  58.4 kg    Height:        Constitutional: well appearing. no distress. Appears well nourished.  Neurologic: CN intact. no focal findings. no sensory loss. Psychiatric:  Mood and affect symmetric and appropriate. Eyes:  No icterus. No conjunctival pallor. Ears, nose, throat:  mucous membranes moist. Midline trachea.  Cardiac: regular rate and rhythm.  Respiratory:  unlabored. Abdominal:  soft, non-tender, non-distended.  Peripheral vascular: 2+ radial pulses. Left wrist scarred from ORIF. No pacemakers or ports in either chest. Extremity: no edema. no cyanosis. no pallor.  Skin: no gangrene. no ulceration.  Lymphatic: no Stemmer's sign.  no palpable lymphadenopathy.  PERTINENT LABORATORY AND RADIOLOGIC DATA  Most recent CBC CBC Latest Ref Rng & Units 07/03/2021 07/02/2021 07/02/2021  WBC 4.0 - 10.5 K/uL 6.3 - 5.7   Hemoglobin 12.0 - 15.0 g/dL 9.0(L) 10.7(L) 7.1(L)  Hematocrit 36.0 - 46.0 % 26.6(L) 31.0(L) 21.0(L)  Platelets 150 - 400 K/uL 345 - 294     Most recent CMP CMP Latest Ref Rng & Units 07/03/2021 07/02/2021 07/01/2021  Glucose 70 - 99 mg/dL 93 84 156(H)  BUN 8 - 23 mg/dL 46(H) 46(H) 48(H)  Creatinine 0.44 - 1.00 mg/dL 3.88(H) 4.10(H) 4.42(H)  Sodium 135 - 145 mmol/L 132(L) 133(L) 131(L)  Potassium 3.5 - 5.1 mmol/L 3.9 4.3 4.2  Chloride 98 - 111 mmol/L 103 105 99  CO2 22 - 32 mmol/L 19(L) 19(L) 22  Calcium 8.9 - 10.3 mg/dL 7.8(L) 7.8(L) 8.2(L)  Total Protein 6.5 - 8.1 g/dL - - 6.3(L)  Total Bilirubin 0.3 - 1.2 mg/dL - - 0.5  Alkaline Phos 38 - 126 U/L - - 64  AST 15 - 41 U/L - - 31  ALT 0 - 44 U/L - - 20    Renal function Estimated Creatinine Clearance: 10.7 mL/min (A) (by C-G formula based on SCr of 3.88 mg/dL (H)).  Hgb A1c MFr Bld (%)  Date Value  06/09/2021 10.9 (H)   [name], MD [facility] Phone Number: [phone] 07/03/2021 9:54 AM

## 2021-07-03 NOTE — Progress Notes (Signed)
VASCULAR LAB    Upper extremity vein mapping has been performed.  See CV proc for preliminary results.   Rolando Whitby, RVT 07/03/2021, 2:11 PM

## 2021-07-03 NOTE — H&P (View-Only) (Signed)
[facility] [place]  ASSESSMENT / PLAN: 75 y.o. female with AKI on CKD 4.  She is likely to progress to dialysis in the near future.  We will plan to create left-sided dialysis access for her.  Have updated vein mapping order to stat.  Please keep her n.p.o. after midnight.  One of my partners will likely be able to do this tomorrow afternoon.  CHIEF COMPLAINT: Fatigue  HISTORY OF PRESENT ILLNESS: Jackie Wade is a 75 y.o. female admitted to the medicine service for symptomatic anemia in the setting of chronic kidney disease.  She reports progressive symptoms of uremia over the past several weeks including fatigue and dysgeusia.  She was found to be quite anemic as an outpatient and admitted for transfusion.  She has had significant symptomatic relief from 2 units of packed red blood cells.  She had an acute kidney injury on presentation.  Fortunately this is improved with transfusion.  It is felt she will likely progress to dialysis in the near future, but does not need urgent dialysis access.  She has a history of ORIF to the left wrist.  No history of pacemakers or central venous catheters.  She is right-handed.  Past Medical History:  Diagnosis Date   Chronic kidney disease (CKD), stage IV (severe) (HCC)    Diabetes mellitus without complication ([place])    Renal disorder     Past Surgical History:  Procedure Laterality Date   WRIST SURGERY      Family History  Problem Relation Age of Onset   Premature CHD Neg Hx     Social History   Socioeconomic History   Marital status: Married    Spouse name: Not on file   Number of children: Not on file   Years of education: Not on file   Highest education level: Not on file  Occupational History   Not on file  Tobacco Use   Smoking status: Never   Smokeless tobacco: Never  Vaping Use   Vaping Use: Never used  Substance and Sexual Activity   Alcohol use: No   Drug use: No   Sexual activity: Not on file   Other Topics Concern   Not on file  Social History Narrative   Not on file   Social Determinants of Health   Financial Resource Strain: Not on file  Food Insecurity: Not on file  Transportation Needs: Not on file  Physical Activity: Not on file  Stress: Not on file  Social Connections: Not on file  Intimate Partner Violence: Not on file    No Known Allergies  Current Facility-Administered Medications  Medication Dose Route Frequency Provider Last Rate Last Admin   acetaminophen (TYLENOL) tablet 650 mg  650 mg Oral Q6H PRN Etta Quill, DO       Or   acetaminophen (TYLENOL) suppository 650 mg  650 mg Rectal Q6H PRN Etta Quill, DO       amLODipine (NORVASC) tablet 10 mg  10 mg Oral Daily Jennette Kettle M, DO   10 mg at 07/03/21 1324   Darbepoetin Alfa (ARANESP) injection 60 mcg  60 mcg Subcutaneous Q Sat-1800 Elmarie Shiley, MD   60 mcg at 07/02/21 2232   feeding supplement (NEPRO CARB STEADY) liquid 237 mL  237 mL Oral BID BM Elmarie Shiley, MD   237 mL at 07/03/21 0917   furosemide (LASIX) tablet 40 mg  40 mg Oral q1600 Etta Quill, DO   40 mg at 07/02/21  1726   furosemide (LASIX) tablet 80 mg  80 mg Oral q AM Etta Quill, DO   80 mg at 07/03/21 0732   insulin aspart (novoLOG) injection 0-9 Units  0-9 Units Subcutaneous Q4H Etta Quill, DO   1 Units at 07/03/21 0114   linagliptin (TRADJENTA) tablet 5 mg  5 mg Oral Daily Jennette Kettle M, DO   5 mg at 07/03/21 5409   ondansetron (ZOFRAN) tablet 4 mg  4 mg Oral Q6H PRN Etta Quill, DO       Or   ondansetron [facility]) injection 4 mg  4 mg Intravenous Q6H PRN Etta Quill, DO       rosuvastatin (CRESTOR) tablet 5 mg  5 mg Oral Daily Alcario Drought, Jared M, DO   5 mg at 07/03/21 8119    REVIEW OF SYSTEMS:  [X]  denotes positive finding, [ ]  denotes negative finding Cardiac  Comments:  Chest pain or chest pressure:    Shortness of breath upon exertion:    Short of breath when lying flat:    Irregular heart  rhythm:        Vascular    Pain in calf, thigh, or hip brought on by ambulation:    Pain in feet at night that wakes you up from your sleep:     Blood clot in your veins:    Leg swelling:         Pulmonary    Oxygen at home:    Productive cough:     Wheezing:         Neurologic    Sudden weakness in arms or legs:     Sudden numbness in arms or legs:     Sudden onset of difficulty speaking or slurred speech:    Temporary loss of vision in one eye:     Problems with dizziness:         Gastrointestinal    Blood in stool:     Vomited blood:         Genitourinary    Burning when urinating:     Blood in urine:        Psychiatric    Major depression:         Hematologic    Bleeding problems:    Problems with blood clotting too easily:        Skin    Rashes or ulcers:        Constitutional    Fever or chills:      PHYSICAL EXAM Vitals:   07/02/21 1950 07/03/21 0344 07/03/21 0500 07/03/21 0730  BP: (!) 152/74 (!) 142/66    Pulse: 88 84    Resp: 16 16 (!) 26   Temp: 98.1 F (36.7 C) 99.2 F (37.3 C)    TempSrc: Oral Oral    SpO2: 93% 93%  95%  Weight:  58.4 kg    Height:        Constitutional: well appearing. no distress. Appears well nourished.  Neurologic: CN intact. no focal findings. no sensory loss. Psychiatric:  Mood and affect symmetric and appropriate. Eyes:  No icterus. No conjunctival pallor. Ears, nose, throat:  mucous membranes moist. Midline trachea.  Cardiac: regular rate and rhythm.  Respiratory:  unlabored. Abdominal:  soft, non-tender, non-distended.  Peripheral vascular: 2+ radial pulses. Left wrist scarred from ORIF. No pacemakers or ports in either chest. Extremity: no edema. no cyanosis. no pallor.  Skin: no gangrene. no ulceration.  Lymphatic: no Stemmer's sign.  no palpable lymphadenopathy.  PERTINENT LABORATORY AND RADIOLOGIC DATA  Most recent CBC CBC Latest Ref Rng & Units 07/03/2021 07/02/2021 07/02/2021  WBC 4.0 - 10.5 K/uL 6.3 - 5.7   Hemoglobin 12.0 - 15.0 g/dL 9.0(L) 10.7(L) 7.1(L)  Hematocrit 36.0 - 46.0 % 26.6(L) 31.0(L) 21.0(L)  Platelets 150 - 400 K/uL 345 - 294     Most recent CMP CMP Latest Ref Rng & Units 07/03/2021 07/02/2021 07/01/2021  Glucose 70 - 99 mg/dL 93 84 156(H)  BUN 8 - 23 mg/dL 46(H) 46(H) 48(H)  Creatinine 0.44 - 1.00 mg/dL 3.88(H) 4.10(H) 4.42(H)  Sodium 135 - 145 mmol/L 132(L) 133(L) 131(L)  Potassium 3.5 - 5.1 mmol/L 3.9 4.3 4.2  Chloride 98 - 111 mmol/L 103 105 99  CO2 22 - 32 mmol/L 19(L) 19(L) 22  Calcium 8.9 - 10.3 mg/dL 7.8(L) 7.8(L) 8.2(L)  Total Protein 6.5 - 8.1 g/dL - - 6.3(L)  Total Bilirubin 0.3 - 1.2 mg/dL - - 0.5  Alkaline Phos 38 - 126 U/L - - 64  AST 15 - 41 U/L - - 31  ALT 0 - 44 U/L - - 20    Renal function Estimated Creatinine Clearance: 10.7 mL/min (A) (by C-G formula based on SCr of 3.88 mg/dL (H)).  Hgb A1c MFr Bld (%)  Date Value  06/09/2021 10.9 (H)   [name], MD [facility] Phone Number: [phone] 07/03/2021 9:54 AM

## 2021-07-03 NOTE — Progress Notes (Signed)
Patient ID: Jackie Wade, female   DOB: 1946-05-06, 75 y.o.   MRN: [id number] [facility] Progress Note   Assessment/ Plan:   1.  Acute kidney injury on chronic kidney disease stage IV: Slight improvement of creatinine this morning on her labs suggest that this might have been hemodynamically mediated from her severe/symptomatic anemia.  She does not have any acute indications for dialysis at this time and I appreciate Dr. Mora Appl assistance in evaluating her for permanent dialysis access. 2.  Symptomatic anemia: Likely secondary to chronic kidney disease and recent hospitalization.  Improved status post PRBC transfusion and started on ESA yesterday.  Iron stores adequate. 3.  Hypertension/volume: She has acceptable blood pressure control on amlodipine and furosemide with continued efforts at diuresis for volume management (pedal edema). 4.  Hyponatremia: Mild and improving with some improvement of renal function-continue to follow on furosemide. 5.  Nutrition: With proteinuric renal disease (DM/FSGS) and progressively worsening renal insufficiency.  Encouraged fluid and protein intake and placed on ONS while here.  Subjective:   Reports that she continues to feel better and has improving energy level/decreased dysgeusia this morning.   Objective:   BP (!) 142/66 (BP Location: Left Arm)   Pulse 84   Temp 99.2 F (37.3 C) (Oral)   Resp (!) 26   Ht 5\' 3"  (1.6 m)   Wt 58.4 kg Comment: scale b  SpO2 95%   BMI 22.81 kg/m   Intake/Output Summary (Last 24 hours) at 07/03/2021 6644 Last data filed at 07/02/2021 1900 Gross per 24 hour  Intake 880 ml  Output --  Net 880 ml   Weight change: -2.9 kg  Physical Exam: Gen: Comfortably resting in bed, sitting up CVS: Pulse regular rhythm, normal rate, S1 and S2 normal Resp: Clear to auscultation bilaterally, no rales/rhonchi Abd: Soft, flat, nontender, bowel sounds normal Ext: 1-2+ lower extremity edema  Imaging: DG Chest Port  1 View  Result Date: 07/01/2021 CLINICAL DATA:  Shortness of breath EXAM: PORTABLE CHEST 1 VIEW COMPARISON:  09/09/2015 FINDINGS: Cardiomegaly. No focal opacity or pleural effusion. No pneumothorax. IMPRESSION: Cardiomegaly. Electronically Signed   By: Donavan Foil M.D.   On: 07/01/2021 19:06    Labs: BMET Recent Labs  Lab 07/01/21 1605 07/02/21 0345 07/03/21 0336  NA 131* 133* 132*  K 4.2 4.3 3.9  CL 99 105 103  CO2 22 19* 19*  GLUCOSE 156* 84 93  BUN 48* 46* 46*  CREATININE 4.42* 4.10* 3.88*  CALCIUM 8.2* 7.8* 7.8*  PHOS  --   --  4.0   CBC Recent Labs  Lab 07/01/21 1605 07/02/21 0345 07/02/21 1957 07/03/21 0336  WBC 5.9 5.7  --  6.3  NEUTROABS 4.9  --   --   --   HGB 6.7* 7.1* 10.7* 9.0*  HCT 19.9* 21.0* 31.0* 26.6*  MCV 85.0 83.3  --  83.4  PLT 358 294  --  345   Medications:     amLODipine  10 mg Oral Daily   darbepoetin (ARANESP) injection - NON-DIALYSIS  60 mcg Subcutaneous Q Sat-1800   feeding supplement (NEPRO CARB STEADY)  237 mL Oral BID BM   furosemide  40 mg Oral q1600   furosemide  80 mg Oral q AM   insulin aspart  0-9 Units Subcutaneous Q4H   linagliptin  5 mg Oral Daily   rosuvastatin  5 mg Oral Daily   Elmarie Shiley, MD 07/03/2021, 9:25 AM

## 2021-07-04 ENCOUNTER — Inpatient Hospital Stay (HOSPITAL_COMMUNITY): Payer: Medicare Other | Admitting: Certified Registered"

## 2021-07-04 ENCOUNTER — Encounter (HOSPITAL_COMMUNITY)
Admission: EM | Disposition: A | Discharge status: Home / Self Care | Payer: Self-pay | Source: Home / Self Care | Attending: Internal Medicine

## 2021-07-04 ENCOUNTER — Encounter (HOSPITAL_COMMUNITY): Payer: Self-pay | Admitting: Internal Medicine

## 2021-07-04 DIAGNOSIS — N179 Acute kidney failure, unspecified: Secondary | ICD-10-CM

## 2021-07-04 DIAGNOSIS — N184 Chronic kidney disease, stage 4 (severe): Secondary | ICD-10-CM

## 2021-07-04 HISTORY — PX: BASCILIC VEIN TRANSPOSITION: SHX5742

## 2021-07-04 LAB — CBC
HCT: 27.7 % — ABNORMAL LOW (ref 36.0–46.0)
Hemoglobin: 9.7 g/dL — ABNORMAL LOW (ref 12.0–15.0)
MCH: 28.9 pg (ref 26.0–34.0)
MCHC: 35 g/dL (ref 30.0–36.0)
MCV: 82.4 fL (ref 80.0–100.0)
Platelets: 365 10*3/uL (ref 150–400)
RBC: 3.36 MIL/uL — ABNORMAL LOW (ref 3.87–5.11)
RDW: 12.7 % (ref 11.5–15.5)
WBC: 6.2 10*3/uL (ref 4.0–10.5)
nRBC: 0 % (ref 0.0–0.2)

## 2021-07-04 LAB — GLUCOSE, CAPILLARY
Glucose-Capillary: 138 mg/dL — ABNORMAL HIGH (ref 70–99)
Glucose-Capillary: 83 mg/dL (ref 70–99)
Glucose-Capillary: 76 mg/dL (ref 70–99)
Glucose-Capillary: 80 mg/dL (ref 70–99)
Glucose-Capillary: 74 mg/dL (ref 70–99)
Glucose-Capillary: 92 mg/dL (ref 70–99)

## 2021-07-04 LAB — TYPE AND SCREEN
ABO/RH(D): O POS
Antibody Screen: NEGATIVE
Unit division: 0
Unit division: 0

## 2021-07-04 LAB — BPAM RBC
Blood Product Expiration Date: 202209092359
Blood Product Expiration Date: 202209092359
ISSUE DATE / TIME: 202208051958
ISSUE DATE / TIME: 202208061457
Unit Type and Rh: 5100
Unit Type and Rh: 5100

## 2021-07-04 LAB — RENAL FUNCTION PANEL
Albumin: 1.6 g/dL — ABNORMAL LOW (ref 3.5–5.0)
Anion gap: 8 (ref 5–15)
BUN: 47 mg/dL — ABNORMAL HIGH (ref 8–23)
CO2: 21 mmol/L — ABNORMAL LOW (ref 22–32)
Calcium: 7.8 mg/dL — ABNORMAL LOW (ref 8.9–10.3)
Chloride: 103 mmol/L (ref 98–111)
Creatinine, Ser: 3.93 mg/dL — ABNORMAL HIGH (ref 0.44–1.00)
GFR, Estimated: 12 mL/min — ABNORMAL LOW (ref >60)
Glucose, Bld: 104 mg/dL — ABNORMAL HIGH (ref 70–99)
Phosphorus: 4.3 mg/dL (ref 2.5–4.6)
Potassium: 3.6 mmol/L (ref 3.5–5.1)
Sodium: 132 mmol/L — ABNORMAL LOW (ref 135–145)

## 2021-07-04 SURGERY — TRANSPOSITION, VEIN, BASILIC
Anesthesia: Monitor Anesthesia Care | Laterality: Left

## 2021-07-04 MED ORDER — LIDOCAINE 2% (20 MG/ML) 5 ML SYRINGE
INTRAMUSCULAR | Status: DC | PRN
  Administered 2021-07-04: 100 mg via INTRAVENOUS

## 2021-07-04 MED ORDER — PROPOFOL 10 MG/ML IV BOLUS
INTRAVENOUS | Status: DC | PRN
  Administered 2021-07-04: 150 mg via INTRAVENOUS

## 2021-07-04 MED ORDER — HEPARIN SODIUM (PORCINE) 1000 UNIT/ML IJ SOLN
INTRAMUSCULAR | Status: DC | PRN
  Administered 2021-07-04: 5000 [IU] via INTRAVENOUS

## 2021-07-04 MED ORDER — FENTANYL CITRATE (PF) 100 MCG/2ML IJ SOLN
25.0000 ug | INTRAMUSCULAR | Status: DC | PRN
  Administered 2021-07-04: 25 ug via INTRAVENOUS

## 2021-07-04 MED ORDER — CEFAZOLIN SODIUM-DEXTROSE 2-3 GM-%(50ML) IV SOLR
INTRAVENOUS | Status: DC | PRN
  Administered 2021-07-04: 2 g via INTRAVENOUS

## 2021-07-04 MED ORDER — PHENYLEPHRINE HCL-NACL 20-0.9 MG/250ML-% IV SOLN
INTRAVENOUS | Status: DC | PRN
  Administered 2021-07-04: 25 ug/min via INTRAVENOUS

## 2021-07-04 MED ORDER — 0.9 % SODIUM CHLORIDE (POUR BTL) OPTIME
TOPICAL | Status: DC | PRN
  Administered 2021-07-04: 1000 mL

## 2021-07-04 MED ORDER — HEPARIN 6000 UNIT IRRIGATION SOLUTION
Status: DC | PRN
  Administered 2021-07-04: 1

## 2021-07-04 MED ORDER — HEPARIN SODIUM (PORCINE) 1000 UNIT/ML IJ SOLN
INTRAMUSCULAR | Status: AC
  Filled 2021-07-04: qty 1

## 2021-07-04 MED ORDER — ORAL CARE MOUTH RINSE: 15.0000 mL | Freq: Once | OROMUCOSAL | Status: AC

## 2021-07-04 MED ORDER — PROPOFOL 10 MG/ML IV BOLUS
INTRAVENOUS | Status: AC
  Filled 2021-07-04: qty 20

## 2021-07-04 MED ORDER — FENTANYL CITRATE (PF) 250 MCG/5ML IJ SOLN
INTRAMUSCULAR | Status: AC
  Filled 2021-07-04: qty 5

## 2021-07-04 MED ORDER — FENTANYL CITRATE (PF) 100 MCG/2ML IJ SOLN
INTRAMUSCULAR | Status: AC
  Filled 2021-07-04: qty 2

## 2021-07-04 MED ORDER — PHENYLEPHRINE 40 MCG/ML (10ML) SYRINGE FOR IV PUSH (FOR BLOOD PRESSURE SUPPORT)
PREFILLED_SYRINGE | INTRAVENOUS | Status: DC | PRN
Start: 2021-07-04 — End: 2021-07-04
  Administered 2021-07-04 (×2): 80 ug via INTRAVENOUS

## 2021-07-04 MED ORDER — LIDOCAINE HCL (PF) 1 % IJ SOLN
INTRAMUSCULAR | Status: AC
  Filled 2021-07-04: qty 30

## 2021-07-04 MED ORDER — CEFAZOLIN SODIUM 1 G IJ SOLR
INTRAMUSCULAR | Status: AC
  Filled 2021-07-04: qty 20

## 2021-07-04 MED ORDER — FENTANYL CITRATE (PF) 250 MCG/5ML IJ SOLN
INTRAMUSCULAR | Status: DC | PRN
  Administered 2021-07-04: 50 ug via INTRAVENOUS

## 2021-07-04 MED ORDER — HEPARIN 6000 UNIT IRRIGATION SOLUTION
Status: AC
  Filled 2021-07-04: qty 500

## 2021-07-04 MED ORDER — PROTAMINE SULFATE 10 MG/ML IV SOLN
INTRAVENOUS | Status: AC
  Filled 2021-07-04: qty 5

## 2021-07-04 MED ORDER — PROTAMINE SULFATE 10 MG/ML IV SOLN
INTRAVENOUS | Status: DC | PRN
  Administered 2021-07-04: 50 mg via INTRAVENOUS

## 2021-07-04 MED ORDER — CHLORHEXIDINE GLUCONATE 0.12 % MT SOLN
15.0000 mL | Freq: Once | OROMUCOSAL | Status: AC
  Administered 2021-07-04: 15 mL via OROMUCOSAL

## 2021-07-04 MED ORDER — LIDOCAINE 2% (20 MG/ML) 5 ML SYRINGE
INTRAMUSCULAR | Status: AC
  Filled 2021-07-04: qty 5

## 2021-07-04 MED ORDER — INSULIN GLARGINE 100 UNIT/ML SOLOSTAR PEN: 3.0000 [IU] | PEN_INJECTOR | Freq: Every day | SUBCUTANEOUS | Status: DC

## 2021-07-04 MED ORDER — CHLORHEXIDINE GLUCONATE 0.12 % MT SOLN
OROMUCOSAL | Status: AC
  Filled 2021-07-04: qty 15

## 2021-07-04 MED ORDER — SODIUM CHLORIDE 0.9 % IV SOLN: INTRAVENOUS | Status: DC

## 2021-07-04 MED ORDER — ONDANSETRON HCL 4 MG/2ML IJ SOLN
INTRAMUSCULAR | Status: DC | PRN
  Administered 2021-07-04: 4 mg via INTRAVENOUS

## 2021-07-04 MED ORDER — PHENOL 1.4 % MT LIQD
1.0000 | OROMUCOSAL | Status: DC | PRN
  Administered 2021-07-04: 1 via OROMUCOSAL
  Filled 2021-07-04: qty 177

## 2021-07-04 SURGICAL SUPPLY — 34 items
ADH SKN CLS APL DERMABOND .7 (GAUZE/BANDAGES/DRESSINGS) ×1
ARMBAND PINK RESTRICT EXTREMIT (MISCELLANEOUS) ×4 IMPLANT
BAG COUNTER SPONGE SURGICOUNT (BAG) ×2 IMPLANT
BAG SPNG CNTER NS LX DISP (BAG) ×1
CANISTER SUCT 3000ML PPV (MISCELLANEOUS) ×2 IMPLANT
CANNULA VESSEL 3MM 2 BLNT TIP (CANNULA) ×2 IMPLANT
CLIP VESOCCLUDE MED 6/CT (CLIP) ×2 IMPLANT
CLIP VESOCCLUDE SM WIDE 6/CT (CLIP) ×2 IMPLANT
COVER PROBE W GEL 5X96 (DRAPES) IMPLANT
DECANTER SPIKE VIAL GLASS SM (MISCELLANEOUS) ×2 IMPLANT
DERMABOND ADVANCED (GAUZE/BANDAGES/DRESSINGS) ×1
DERMABOND ADVANCED .7 DNX12 (GAUZE/BANDAGES/DRESSINGS) ×1 IMPLANT
DRAIN PENROSE 1/4X12 LTX STRL (WOUND CARE) ×2 IMPLANT
ELECT REM PT RETURN 9FT ADLT (ELECTROSURGICAL) ×2
ELECTRODE REM PT RTRN 9FT ADLT (ELECTROSURGICAL) ×1 IMPLANT
GLOVE SURG ENC MOIS LTX SZ7.5 (GLOVE) ×2 IMPLANT
GOWN STRL REUS W/ TWL LRG LVL3 (GOWN DISPOSABLE) ×3 IMPLANT
GOWN STRL REUS W/TWL LRG LVL3 (GOWN DISPOSABLE) ×8
KIT BASIN OR (CUSTOM PROCEDURE TRAY) ×2 IMPLANT
KIT TURNOVER KIT B (KITS) ×2 IMPLANT
LOOP VESSEL MINI RED (MISCELLANEOUS) IMPLANT
NS IRRIG 1000ML POUR BTL (IV SOLUTION) ×2 IMPLANT
PACK CV ACCESS (CUSTOM PROCEDURE TRAY) ×2 IMPLANT
PAD ARMBOARD 7.5X6 YLW CONV (MISCELLANEOUS) ×4 IMPLANT
SPONGE SURGIFOAM ABS GEL 100 (HEMOSTASIS) IMPLANT
SUT PROLENE 6 0 BV (SUTURE) ×2 IMPLANT
SUT PROLENE 6 0 CC (SUTURE) ×1 IMPLANT
SUT PROLENE 7 0 BV 1 (SUTURE) IMPLANT
SUT VIC AB 3-0 SH 27 (SUTURE) ×2
SUT VIC AB 3-0 SH 27X BRD (SUTURE) ×1 IMPLANT
SUT VICRYL 4-0 PS2 18IN ABS (SUTURE) ×2 IMPLANT
TOWEL GREEN STERILE (TOWEL DISPOSABLE) ×2 IMPLANT
UNDERPAD 30X36 HEAVY ABSORB (UNDERPADS AND DIAPERS) ×2 IMPLANT
WATER STERILE IRR 1000ML POUR (IV SOLUTION) ×2 IMPLANT

## 2021-07-04 NOTE — Progress Notes (Signed)
Discharge instructions (including medications) discussed with and copy provided to patient/caregiver 

## 2021-07-04 NOTE — Care Management (Signed)
1644 07-04-21 Case Manager received a call from the [facility] RN that the patient stated her [place] is not accurate. Patient wants to start using glucose meter and strips. Case Manager spoke with the patient and relayed to her that she could purchase a [place] meter at [employer]. [employer] is usually the cheapest for meters and strips. Case Management cannot assist with meter, patient has insurance on file.

## 2021-07-04 NOTE — Discharge Summary (Signed)
Physician Discharge Summary  Jackie Wade YME:[id number] DOB: 03/25/1946 DOA: 07/01/2021  PCP: Jackie Lei, MD  Admit date: 07/01/2021 Discharge date: 07/04/2021  Discharge disposition: Home   Recommendations for Outpatient Follow-Up:   Follow-up with Dr. Gean Quint, nephrologist in 1 week   Discharge Diagnosis:   Principal Problem:   Symptomatic anemia Active Problems:   Essential hypertension   Anemia due to chronic kidney disease   Type II diabetes mellitus, uncontrolled (HCC)   CKD (chronic kidney disease) stage 5, GFR less than 15 ml/min (HCC)    Discharge Condition: Stable.  Diet recommendation:  Diet Order             Diet renal/carb modified with fluid restriction Diet-HS Snack? Nothing; Fluid restriction: 1200 mL Fluid; Room service appropriate? Yes; Fluid consistency: Thin  Diet effective now           Diet - low sodium heart healthy           Diet Carb Modified                     Code Status: Full Code     Hospital Course:    [name] is a 75 y.o. female with past medical history significant for CKD stage V, type 2 diabetes mellitus, hypertension, anemia of chronic kidney disease, who was referred to the emergency room by her nephrologist because of low hemoglobin and progressive renal failure.  She complained of increasing leg swelling, fatigue, shortness of breath and generalized weakness.  Her hemoglobin was 6.7 in the emergency room.   She was admitted to the hospital for severe anemia, AKI on CKD stage IV.  She was transfused with 2 units of packed red blood cells.  She had significant peripheral edema which improved with Lasix.  Nephrologist was consulted to assist with management.  Initially, initiation of hemodialysis was contemplated but this was aborted because of improvement in kidney function.  Vascular surgeon was consulted for placement of dialysis access.  Her condition has improved and she is deemed stable for discharge  to home today.    Medical Consultants:   Nephrologist Vascular surgeon   Discharge Exam:    Vitals:   07/04/21 1303 07/04/21 1316 07/04/21 1331 07/04/21 1404  BP: 123/65 114/63 130/65 121/64  Pulse: 80 79 76 75  Resp: (!) 22 16 (!) 23 17  Temp: (!) 97.1 F (36.2 C)  98 F (36.7 C) 98.2 F (36.8 C)  TempSrc:    Oral  SpO2: 94% 91% 91% 91%  Weight:      Height:         GEN: NAD SKIN: Warm and dry EYES: No pallor or icterus ENT: MMM CV: RRR PULM: CTA B ABD: soft, ND, NT, +BS CNS: AAO x 3, non focal EXT: No edema or tenderness   The results of significant diagnostics from this hospitalization (including imaging, microbiology, ancillary and laboratory) are listed below for reference.     Procedures and Diagnostic Studies:   DG Chest Port 1 View  Result Date: 07/01/2021 CLINICAL DATA:  Shortness of breath EXAM: PORTABLE CHEST 1 VIEW COMPARISON:  09/09/2015 FINDINGS: Cardiomegaly. No focal opacity or pleural effusion. No pneumothorax. IMPRESSION: Cardiomegaly. Electronically Signed   By: Donavan Foil M.D.   On: 07/01/2021 19:06     Labs:   Basic Metabolic Panel: Recent Labs  Lab 07/01/21 1605 07/02/21 0345 07/03/21 0336 07/04/21 0307  NA 131* 133* 132* 132*  K 4.2 4.3 3.9  3.6  CL 99 105 103 103  CO2 22 19* 19* 21*  GLUCOSE 156* 84 93 104*  BUN 48* 46* 46* 47*  CREATININE 4.42* 4.10* 3.88* 3.93*  CALCIUM 8.2* 7.8* 7.8* 7.8*  MG  --   --  1.8  --   PHOS  --   --  4.0 4.3   GFR Estimated Creatinine Clearance: 10.5 mL/min (A) (by C-G formula based on SCr of 3.93 mg/dL (H)). Liver Function Tests: Recent Labs  Lab 07/01/21 1605 07/03/21 0336 07/04/21 0307  AST 31  --   --   ALT 20  --   --   ALKPHOS 64  --   --   BILITOT 0.5  --   --   PROT 6.3*  --   --   ALBUMIN 2.3* 1.7* 1.6*   No results for input(s): LIPASE, AMYLASE in the last 168 hours. No results for input(s): AMMONIA in the last 168 hours. Coagulation profile No results for  input(s): INR, PROTIME in the last 168 hours.  CBC: Recent Labs  Lab 07/01/21 1605 07/02/21 0345 07/02/21 1957 07/03/21 0336 07/04/21 0307  WBC 5.9 5.7  --  6.3 6.2  NEUTROABS 4.9  --   --   --   --   HGB 6.7* 7.1* 10.7* 9.0* 9.7*  HCT 19.9* 21.0* 31.0* 26.6* 27.7*  MCV 85.0 83.3  --  83.4 82.4  PLT 358 294  --  345 365   Cardiac Enzymes: No results for input(s): CKTOTAL, CKMB, CKMBINDEX, TROPONINI in the last 168 hours. BNP: Invalid input(s): POCBNP CBG: Recent Labs  Lab 07/04/21 0007 07/04/21 0428 07/04/21 0735 07/04/21 1009 07/04/21 1302  GLUCAP 138* 83 76 80 74   D-Dimer No results for input(s): DDIMER in the last 72 hours. Hgb A1c No results for input(s): HGBA1C in the last 72 hours. Lipid Profile No results for input(s): CHOL, HDL, LDLCALC, TRIG, CHOLHDL, LDLDIRECT in the last 72 hours. Thyroid function studies No results for input(s): TSH, T4TOTAL, T3FREE, THYROIDAB in the last 72 hours.  Invalid input(s): FREET3 Anemia work up No results for input(s): VITAMINB12, FOLATE, FERRITIN, TIBC, IRON, RETICCTPCT in the last 72 hours. Microbiology Recent Results (from the past 240 hour(s))  Resp Panel by RT-PCR (Flu A&B, Covid) Nasopharyngeal Swab     Status: None   Collection Time: 07/01/21  6:57 PM   Specimen: Nasopharyngeal Swab; Nasopharyngeal(NP) swabs in vial transport medium  Result Value Ref Range Status   SARS Coronavirus 2 by RT PCR NEGATIVE NEGATIVE Final    Comment: (NOTE) SARS-CoV-2 target nucleic acids are NOT DETECTED.  The SARS-CoV-2 RNA is generally detectable in upper respiratory specimens during the acute phase of infection. The lowest concentration of SARS-CoV-2 viral copies this assay can detect is 138 copies/mL. A negative result does not preclude SARS-Cov-2 infection and should not be used as the sole basis for treatment or other patient management decisions. A negative result may occur with  improper specimen collection/handling,  submission of specimen other than nasopharyngeal swab, presence of viral mutation(s) within the areas targeted by this assay, and inadequate number of viral copies(<138 copies/mL). A negative result must be combined with clinical observations, patient history, and epidemiological information. The expected result is Negative.  Fact Sheet for Patients:  EntrepreneurPulse.com.au  Fact Sheet for Healthcare Providers:  IncredibleEmployment.be  This test is no t yet approved or cleared by the [place] FDA and  has been authorized for detection and/or diagnosis of SARS-CoV-2 by FDA under an  Emergency Use Authorization (EUA). This EUA will remain  in effect (meaning this test can be used) for the duration of the COVID-19 declaration under Section 564(b)(1) of the Act, 21 U.S.C.section 360bbb-3(b)(1), unless the authorization is terminated  or revoked sooner.       Influenza A by PCR NEGATIVE NEGATIVE Final   Influenza B by PCR NEGATIVE NEGATIVE Final    Comment: (NOTE) The Xpert Xpress SARS-CoV-2/FLU/RSV plus assay is intended as an aid in the diagnosis of influenza from Nasopharyngeal swab specimens and should not be used as a sole basis for treatment. Nasal washings and aspirates are unacceptable for Xpert Xpress SARS-CoV-2/FLU/RSV testing.  Fact Sheet for Patients: EntrepreneurPulse.com.au  Fact Sheet for Healthcare Providers: IncredibleEmployment.be  This test is not yet approved or cleared by the [place] FDA and has been authorized for detection and/or diagnosis of SARS-CoV-2 by FDA under an Emergency Use Authorization (EUA). This EUA will remain in effect (meaning this test can be used) for the duration of the COVID-19 declaration under Section 564(b)(1) of the Act, 21 U.S.C. section 360bbb-3(b)(1), unless the authorization is terminated or revoked.  Performed at [facility], [place] [address] [place], [place] [address]      Discharge Instructions:   Discharge Instructions     Diet - low sodium heart healthy   Complete by: As directed    Diet Carb Modified   Complete by: As directed    Discharge wound care:   Complete by: As directed    Keep wound clean and dry   Increase activity slowly   Complete by: As directed       Allergies as of 07/04/2021   No Known Allergies      Medication List     STOP taking these medications    insulin glargine 100 UNIT/ML Solostar Pen Commonly known as: LANTUS       TAKE these medications    amLODipine 10 MG tablet Commonly known as: NORVASC Take 1 tablet (10 mg total) by mouth daily.   blood glucose meter kit and supplies Kit Dispense based on patient and insurance preference. Use up to four times daily as directed.   FreeStyle Libre 2 Reader Devi 1 each by Does not apply route 4 (four) times daily -  before meals and at bedtime.   FreeStyle Libre 2 Sensor Misc 1 each by Does not apply route every 14 (fourteen) days.   furosemide 40 MG tablet Commonly known as: LASIX Take 40-80 mg by mouth See admin instructions. 80 mg in the morning, and 40 mg at 4p   insulin aspart 100 UNIT/ML FlexPen Commonly known as: NOVOLOG Inject 0-9 Units into the skin 3 (three) times daily with meals. Take # of units per sliding scale instructions provided   Januvia 100 MG tablet Generic drug: sitaGLIPtin Take 100 mg by mouth daily.   Pen Needles 31G X 6 MM Misc 1 each by Does not apply route as needed.   rosuvastatin 5 MG tablet Commonly known as: CRESTOR Take 1 tablet (5 mg total) by mouth daily.               Discharge Care Instructions  (From admission, onward)           Start     Ordered   07/04/21 0000  Discharge wound care:       Comments: Keep wound clean and dry   07/04/21 1432  Follow-up Information     Gean Quint, MD. Schedule an appointment as soon as possible for a visit  in 1 week(s).   Specialty: Nephrology Contact information: [place] [address] [phone]         Jackie Lei, MD. Go on 07/15/2021.   Specialty: Family Medicine Why: _0 :Max Sane information: [address] [place] [address] [phone]         Buford Dresser, MD .   Specialty: Cardiology Contact information: [address] [place] [address] [phone]                  Time coordinating discharge: 33 minutes  Signed:  Jennye Boroughs  [facility] 07/04/2021, 2:49 PM   Pager on [email or web address]. If 7PM-7AM, please contact night-coverage at [email or web address]

## 2021-07-04 NOTE — Op Note (Signed)
Procedure: Left first stage basilic vein fistula  Preoperative diagnosis: CKD4  Postoperative diagnosis: Same  Anesthesia: General  Assistant: Delena Serve, RNFA  Operative findings: 2-1/2 to 3 mm left basilic vein  Operative details: After obtaining informed consent, the patient was taken the operating.  The patient was placed in supine position operating table.  After induction general anesthesia patient's left upper extremities prepped and draped usual sterile fashion.  Ultrasound was used to identify the course of the left basilic vein.  The patient had totally an adequate cephalic vein.  The basilic vein was branched in 3 locations near the antecubital region but there was 1 larger branch that was about 3 mm in diameter.  This enlarged to about 4 mm in diameter as he proceeds up the arm.  I felt this was adequate to create a fistula.  Longitudinal incision was made over the vein just below the antecubital crease.  It was carried down through subcutaneous tissues down to the level of vein and the vein was dissected free circumferentially and small side branches ligated divided tween silk ties.  The brachial artery was then dissected free circumferentially in the medial portion of this incision.  Vesseloops were placed around the brachial artery.  The patient was given 5000 intravenous heparin.  A longitudinal opening was made in the brachial artery the distal end of the basilic vein was ligated with 2-0 silk tie and the vein transected and swung over to the level of the artery.  It was slightly spatulated.  The vein was gently flushed with heparinized saline.  The vein excepted up to a 3 mm dilator.  And end of vein to side of artery anastomosis was created using a running 6-0 Prolene suture.  Just prior to completion anastomosis it was for blood backbled and thoroughly flushed anastomosis was secured clamps released there is palpable flow in the fistula immediately.  There was good Doppler flow  up to the mid upper arm.  Patient still had a palpable radial pulse.  Hemostasis was obtained with direct pressure and 50 mg of protamine.  Subcutaneous tissues were reapproximated using running 3-0 Vicryl suture.  Skin was closed with a 4-0 Vicryl subcuticular stitch.  Dermabond was applied.  Patient tired procedure well and there were no complications.  Sponge and needle counts correct in the case.  Patient was taken to recovery in stable condition.  Ruta Hinds, MD [facility] Office: [phone]

## 2021-07-04 NOTE — Anesthesia Procedure Notes (Addendum)
Procedure Name: LMA Insertion Date/Time: 07/04/2021 11:40 AM Performed by: Barrington Ellison, CRNA Pre-anesthesia Checklist: Patient identified, Emergency Drugs available, Suction available and Patient being monitored Patient Re-evaluated:Patient Re-evaluated prior to induction Oxygen Delivery Method: Circle system utilized Preoxygenation: Pre-oxygenation with 100% oxygen Induction Type: IV induction Ventilation: Mask ventilation without difficulty LMA: LMA inserted LMA Size: 4.0 Placement Confirmation: positive ETCO2 Tube secured with: Tape Dental Injury: Teeth and Oropharynx as per pre-operative assessment

## 2021-07-04 NOTE — Anesthesia Postprocedure Evaluation (Signed)
Anesthesia Post Note  Patient: Jackie Wade  Procedure(s) Performed: LEFT ARM BASCILIC VEIN TRANSPOSITION 1ST STAGE (Left)     Patient location during evaluation: PACU Anesthesia Type: General Level of consciousness: awake Pain management: pain level controlled Vital Signs Assessment: post-procedure vital signs reviewed and stable Respiratory status: spontaneous breathing Cardiovascular status: stable Postop Assessment: no apparent nausea or vomiting Anesthetic complications: no   No notable events documented.  Last Vitals:  Vitals:   07/04/21 1331 07/04/21 1404  BP: 130/65 121/64  Pulse: 76 75  Resp: (!) 23 17  Temp: 36.7 C 36.8 C  SpO2: 91% 91%    Last Pain:  Vitals:   07/04/21 1404  TempSrc: Oral  PainSc:                  Hollynn Garno

## 2021-07-04 NOTE — Interval H&P Note (Signed)
History and Physical Interval Note:  07/04/2021 11:11 AM  Jackie Wade  has presented today for surgery, with the diagnosis of Chronic Kidney Disease.  The various methods of treatment have been discussed with the patient and family. After consideration of risks, benefits and other options for treatment, the patient has consented to  Procedure(s): ARTERIOVENOUS (AV) FISTULA CREATION (Left) as a surgical intervention.  The patient's history has been reviewed, patient examined, no change in status, stable for surgery.  I have reviewed the patient's chart and labs.  Questions were answered to the patient's satisfaction.     Ruta Hinds

## 2021-07-04 NOTE — Progress Notes (Signed)
Patient ID: Jackie Wade, female   DOB: 05-25-1946, 75 y.o.   MRN: [id number] [facility] Progress Note   Assessment/ Plan:   1.  Acute kidney injury on chronic kidney disease stage IV: might have been hemodynamically mediated from her severe/symptomatic anemia.   - She does not have any acute indications for dialysis at this time - Vascular surgery is placing permanent dialysis access today.  She does not need a tunneled catheter    2.  Symptomatic anemia: Likely secondary to chronic kidney disease and recent hospitalization.  Improved status post PRBC transfusion and started on ESA (aranesp 60 mcg) on 8/6.  Iron stores adequate.  3.  Hypertension/volume: She has acceptable blood pressure control on amlodipine and furosemide with continued efforts at diuresis for volume management (pedal edema). Would continue current regimen for now  4.  Hyponatremia: Mild and improving with some improvement of renal function-continue to follow on furosemide.  5.  Nutrition: With proteinuric renal disease (DM/FSGS) and progressively worsening renal insufficiency.  Encouraged fluid and protein intake and placed on ONS while here.  Disposition - stable for discharge after permanent dialysis access placement with vascular surgery today from a nephrology standpoint.  I will ensure that she has follow-up set up with Dr. Daiva Huge in 7-10 days   Subjective:   Spoke with the patient and her brother at bedside.  She feels much better now, has more energy, and is for permanent access later today.  She is right handed and they are planning on using left arm.  Review of systems:  Denies n/v; diarrhea initially but this resolved Denies shortness of breath or chest pain Denies blood per rectum or dark stool   Objective:   BP 134/71   Pulse 88   Temp 99.9 F (37.7 C) (Oral)   Resp 16   Ht 5\' 3"  (1.6 m)   Wt 58.3 kg Comment: scale b  SpO2 97%   BMI 22.78 kg/m   Intake/Output Summary (Last  24 hours) at 07/04/2021 0935 Last data filed at 07/04/2021 0400 Gross per 24 hour  Intake 390 ml  Output --  Net 390 ml   Weight change: -0.067 kg  Physical Exam:  General adult female in bed in no acute distress HEENT normocephalic atraumatic extraocular movements intact sclera anicteric Neck supple trachea midline Lungs clear to auscultation bilaterally normal work of breathing at rest  Heart regular rate and rhythm no rubs or gallops appreciated Abdomen soft nontender nondistended Extremities no edema lower extremities Psych normal mood and affect Neuro - alert and oriented x 3 provides hx and follows commands  Imaging: VAS [place] UPPER EXT VEIN MAPPING (PRE-OP AVF)  Result Date: 07/03/2021 UPPER EXTREMITY VEIN MAPPING Patient Name:  Jackie Wade  Date of Exam:   07/03/2021 Medical Rec #: [id number]        Accession #:    [id number] Date of Birth: 1946-09-04        Patient Gender: F Patient Age:   26 years Exam Location:  [facility] Procedure:      VAS [place] UPPER EXT VEIN MAPPING (PRE-OP AVF) Referring Phys: Jamelle Haring --------------------------------------------------------------------------------  Indications: Pre-access. Limitations: Diminutive vessels, tissue properties Comparison Study: No prior study Performing Technologist: Sharion Dove RVS  Examination Guidelines: A complete evaluation includes B-mode imaging, spectral Doppler, color Doppler, and power Doppler as needed of all accessible portions of each vessel. Bilateral testing is considered an integral part of a complete examination. Limited examinations for reoccurring indications may be  performed as noted. +-----------------+-------------+----------+----------------+ Right Cephalic   Diameter (cm)Depth (cm)    Findings     +-----------------+-------------+----------+----------------+ Prox upper arm       0.14        0.27                    +-----------------+-------------+----------+----------------+ Mid upper arm         0.12        0.19                    +-----------------+-------------+----------+----------------+ Dist upper arm       0.15        0.19   partial thrombus +-----------------+-------------+----------+----------------+ Antecubital fossa    0.30        1.79   partial thrombus +-----------------+-------------+----------+----------------+ Prox forearm         0.22        0.30                    +-----------------+-------------+----------+----------------+ Mid forearm          0.18        0.16                    +-----------------+-------------+----------+----------------+ Dist forearm                             not visualized  +-----------------+-------------+----------+----------------+ Wrist                                    not visualized  +-----------------+-------------+----------+----------------+ +-----------------+-------------+----------+--------------+ Right Basilic    Diameter (cm)Depth (cm)   Findings    +-----------------+-------------+----------+--------------+ Prox upper arm                          not visualized +-----------------+-------------+----------+--------------+ Mid upper arm        0.25        1.66                  +-----------------+-------------+----------+--------------+ Dist upper arm       0.29        0.20                  +-----------------+-------------+----------+--------------+ Antecubital fossa                       not visualized +-----------------+-------------+----------+--------------+ Prox forearm                            not visualized +-----------------+-------------+----------+--------------+ Mid forearm                             not visualized +-----------------+-------------+----------+--------------+ Distal forearm                          not visualized +-----------------+-------------+----------+--------------+ Wrist                                   not visualized  +-----------------+-------------+----------+--------------+ +-----------------+-------------+----------+--------------+ Left Cephalic    Diameter (cm)Depth (cm)   Findings    +-----------------+-------------+----------+--------------+ Prox upper arm  not visualized +-----------------+-------------+----------+--------------+ Mid upper arm                           not visualized +-----------------+-------------+----------+--------------+ Dist upper arm                          not visualized +-----------------+-------------+----------+--------------+ Antecubital fossa    0.18        0.22                  +-----------------+-------------+----------+--------------+ Prox forearm                            not visualized +-----------------+-------------+----------+--------------+ Mid forearm                             not visualized +-----------------+-------------+----------+--------------+ Dist forearm                            not visualized +-----------------+-------------+----------+--------------+ Wrist                                   not visualized +-----------------+-------------+----------+--------------+ +-----------------+-------------+----------+--------------+ Left Basilic     Diameter (cm)Depth (cm)   Findings    +-----------------+-------------+----------+--------------+ Prox upper arm                          not visualized +-----------------+-------------+----------+--------------+ Mid upper arm                           not visualized +-----------------+-------------+----------+--------------+ Dist upper arm                          not visualized +-----------------+-------------+----------+--------------+ Antecubital fossa    0.18        0.19                  +-----------------+-------------+----------+--------------+ Prox forearm                            not visualized  +-----------------+-------------+----------+--------------+ Mid forearm          0.15        0.44                  +-----------------+-------------+----------+--------------+ Distal forearm       0.18        0.23                  +-----------------+-------------+----------+--------------+ Wrist                                   not visualized +-----------------+-------------+----------+--------------+ *See table(s) above for measurements and observations.  Diagnosing physician:    Preliminary     Labs: BMET Recent Labs  Lab 07/01/21 1605 07/02/21 0345 07/03/21 0336 07/04/21 0307  NA 131* 133* 132* 132*  K 4.2 4.3 3.9 3.6  CL 99 105 103 103  CO2 22 19* 19* 21*  GLUCOSE 156* 84 93 104*  BUN 48* 46* 46* 47*  CREATININE 4.42* 4.10* 3.88* 3.93*  CALCIUM 8.2* 7.8* 7.8* 7.8*  PHOS  --   --  4.0 4.3   CBC Recent Labs  Lab 07/01/21 1605 07/02/21 0345 07/02/21 1957 07/03/21 0336 07/04/21 0307  WBC 5.9 5.7  --  6.3 6.2  NEUTROABS 4.9  --   --   --   --   HGB 6.7* 7.1* 10.7* 9.0* 9.7*  HCT 19.9* 21.0* 31.0* 26.6* 27.7*  MCV 85.0 83.3  --  83.4 82.4  PLT 358 294  --  345 365   Medications:     amLODipine  10 mg Oral Daily   darbepoetin (ARANESP) injection - NON-DIALYSIS  60 mcg Subcutaneous Q Sat-1800   feeding supplement (NEPRO CARB STEADY)  237 mL Oral BID BM   furosemide  40 mg Oral q1600   furosemide  80 mg Oral q AM   insulin aspart  0-9 Units Subcutaneous Q4H   linagliptin  5 mg Oral Daily   rosuvastatin  5 mg Oral Daily   Claudia Desanctis, MD 07/04/2021, 9:55 AM

## 2021-07-04 NOTE — Anesthesia Preprocedure Evaluation (Addendum)
Anesthesia Evaluation  Patient identified by MRN, date of birth, ID band Patient awake    Reviewed: Allergy & Precautions, NPO status , Patient's Chart, lab work & pertinent test results  Airway Mallampati: II  TM Distance: >3 FB     Dental   Pulmonary neg pulmonary ROS,    breath sounds clear to auscultation       Cardiovascular hypertension,  Rhythm:Regular Rate:Normal     Neuro/Psych negative neurological ROS     GI/Hepatic negative GI ROS, Neg liver ROS,   Endo/Other  diabetes  Renal/GU Renal disease     Musculoskeletal   Abdominal   Peds  Hematology   Anesthesia Other Findings   Reproductive/Obstetrics                            Anesthesia Physical Anesthesia Plan  ASA: 3  Anesthesia Plan: General   Post-op Pain Management:    Induction: Intravenous  PONV Risk Score and Plan: 2 and Ondansetron and Propofol infusion  Airway Management Planned: LMA  Additional Equipment:   Intra-op Plan:   Post-operative Plan: Extubation in OR  Informed Consent: I have reviewed the patients History and Physical, chart, labs and discussed the procedure including the risks, benefits and alternatives for the proposed anesthesia with the patient or authorized representative who has indicated his/her understanding and acceptance.     Dental advisory given  Plan Discussed with: CRNA and Anesthesiologist  Anesthesia Plan Comments:        Anesthesia Quick Evaluation

## 2021-07-04 NOTE — Plan of Care (Signed)
  Problem: Education: Goal: Knowledge of General Education information will improve Description: Including pain rating scale, medication(s)/side effects and non-pharmacologic comfort measures Outcome: Adequate for Discharge   

## 2021-07-04 NOTE — Transfer of Care (Signed)
Immediate Anesthesia Transfer of Care Note  Patient: Jackie Wade  Procedure(s) Performed: LEFT ARM BASCILIC VEIN TRANSPOSITION 1ST STAGE (Left)  Patient Location: PACU  Anesthesia Type:General  Level of Consciousness: drowsy and patient cooperative  Airway & Oxygen Therapy: Patient Spontanous Breathing  Post-op Assessment: Report given to RN  Post vital signs: Reviewed and stable  Last Vitals:  Vitals Value Taken Time  BP 123/65   Temp    Pulse 79 07/04/21 1301  Resp 10 07/04/21 1301  SpO2 97 % 07/04/21 1301  Vitals shown include unvalidated device data.  Last Pain:  Vitals:   07/04/21 1025  TempSrc: Oral  PainSc:       Patients Stated Pain Goal: 0 (09/12/50 0258)  Complications: No notable events documented.

## 2021-07-05 ENCOUNTER — Encounter (HOSPITAL_COMMUNITY): Payer: Self-pay | Admitting: Vascular Surgery

## 2021-07-12 ENCOUNTER — Encounter (HOSPITAL_COMMUNITY): Payer: Medicare Other

## 2021-07-12 ENCOUNTER — Encounter: Payer: Medicare Other | Admitting: Vascular Surgery

## 2021-07-12 ENCOUNTER — Other Ambulatory Visit (HOSPITAL_COMMUNITY): Payer: Medicare Other

## 2021-07-18 DIAGNOSIS — I129 Hypertensive chronic kidney disease with stage 1 through stage 4 chronic kidney disease, or unspecified chronic kidney disease: Secondary | ICD-10-CM | POA: Diagnosis not present

## 2021-07-18 DIAGNOSIS — D631 Anemia in chronic kidney disease: Secondary | ICD-10-CM | POA: Diagnosis not present

## 2021-07-18 DIAGNOSIS — E1122 Type 2 diabetes mellitus with diabetic chronic kidney disease: Secondary | ICD-10-CM | POA: Diagnosis not present

## 2021-07-18 DIAGNOSIS — N185 Chronic kidney disease, stage 5: Secondary | ICD-10-CM | POA: Diagnosis not present

## 2021-07-21 DIAGNOSIS — N185 Chronic kidney disease, stage 5: Secondary | ICD-10-CM | POA: Diagnosis not present

## 2021-07-21 DIAGNOSIS — I770 Arteriovenous fistula, acquired: Secondary | ICD-10-CM | POA: Diagnosis not present

## 2021-07-21 DIAGNOSIS — N2581 Secondary hyperparathyroidism of renal origin: Secondary | ICD-10-CM | POA: Diagnosis not present

## 2021-07-21 DIAGNOSIS — R609 Edema, unspecified: Secondary | ICD-10-CM | POA: Diagnosis not present

## 2021-07-21 DIAGNOSIS — D631 Anemia in chronic kidney disease: Secondary | ICD-10-CM | POA: Diagnosis not present

## 2021-07-21 DIAGNOSIS — I120 Hypertensive chronic kidney disease with stage 5 chronic kidney disease or end stage renal disease: Secondary | ICD-10-CM | POA: Diagnosis not present

## 2021-07-22 ENCOUNTER — Other Ambulatory Visit: Payer: Self-pay

## 2021-07-22 DIAGNOSIS — N179 Acute kidney failure, unspecified: Secondary | ICD-10-CM

## 2021-08-03 ENCOUNTER — Inpatient Hospital Stay (HOSPITAL_COMMUNITY): Admission: RE | Admit: 2021-08-03 | Payer: Medicare Other | Source: Ambulatory Visit

## 2021-08-04 ENCOUNTER — Other Ambulatory Visit (HOSPITAL_COMMUNITY): Payer: Self-pay | Admitting: *Deleted

## 2021-08-05 ENCOUNTER — Other Ambulatory Visit: Payer: Self-pay

## 2021-08-05 ENCOUNTER — Ambulatory Visit (HOSPITAL_COMMUNITY)
Admission: RE | Admit: 2021-08-05 | Discharge: 2021-08-05 | Disposition: A | Discharge status: Home / Self Care | Payer: Medicare Other | Source: Ambulatory Visit | Attending: Nephrology | Admitting: Nephrology

## 2021-08-05 DIAGNOSIS — I770 Arteriovenous fistula, acquired: Secondary | ICD-10-CM | POA: Diagnosis not present

## 2021-08-05 DIAGNOSIS — R609 Edema, unspecified: Secondary | ICD-10-CM | POA: Diagnosis not present

## 2021-08-05 DIAGNOSIS — Z23 Encounter for immunization: Secondary | ICD-10-CM | POA: Diagnosis not present

## 2021-08-05 DIAGNOSIS — N2581 Secondary hyperparathyroidism of renal origin: Secondary | ICD-10-CM | POA: Diagnosis not present

## 2021-08-05 DIAGNOSIS — I120 Hypertensive chronic kidney disease with stage 5 chronic kidney disease or end stage renal disease: Secondary | ICD-10-CM | POA: Diagnosis not present

## 2021-08-05 DIAGNOSIS — D631 Anemia in chronic kidney disease: Secondary | ICD-10-CM | POA: Diagnosis not present

## 2021-08-05 DIAGNOSIS — N179 Acute kidney failure, unspecified: Secondary | ICD-10-CM

## 2021-08-05 DIAGNOSIS — N185 Chronic kidney disease, stage 5: Secondary | ICD-10-CM | POA: Diagnosis not present

## 2021-08-05 LAB — POCT HEMOGLOBIN-HEMACUE: Hemoglobin: 8.5 g/dL — ABNORMAL LOW (ref 12.0–15.0)

## 2021-08-05 MED ORDER — DARBEPOETIN ALFA 60 MCG/0.3ML IJ SOSY: 60.0000 ug | PREFILLED_SYRINGE | Freq: Once | INTRAMUSCULAR | Status: AC

## 2021-08-05 MED ORDER — DARBEPOETIN ALFA 60 MCG/0.3ML IJ SOSY
PREFILLED_SYRINGE | INTRAMUSCULAR | Status: AC
  Administered 2021-08-05: 60 ug via SUBCUTANEOUS
  Filled 2021-08-05: qty 0.3

## 2021-08-12 ENCOUNTER — Ambulatory Visit (HOSPITAL_COMMUNITY)
Admission: RE | Admit: 2021-08-12 | Discharge: 2021-08-12 | Disposition: A | Discharge status: Home / Self Care | Payer: Medicare Other | Source: Ambulatory Visit | Attending: Vascular Surgery | Admitting: Vascular Surgery

## 2021-08-12 ENCOUNTER — Ambulatory Visit (INDEPENDENT_AMBULATORY_CARE_PROVIDER_SITE_OTHER): Payer: Medicare Other | Admitting: Physician Assistant

## 2021-08-12 ENCOUNTER — Other Ambulatory Visit: Payer: Self-pay

## 2021-08-12 VITALS — BP 179/77 | HR 80 | Temp 97.3°F | Resp 14 | Ht 63.0 in | Wt 156.0 lb

## 2021-08-12 DIAGNOSIS — N184 Chronic kidney disease, stage 4 (severe): Secondary | ICD-10-CM

## 2021-08-12 DIAGNOSIS — N179 Acute kidney failure, unspecified: Secondary | ICD-10-CM | POA: Diagnosis not present

## 2021-08-12 NOTE — Progress Notes (Signed)
    Postoperative Access Visit   History of Present Illness   Jackie Wade is a 75 y.o. year old female who presents for postoperative follow-up for: left first stage basilic vein transposition (Date: 07/04/21) by Dr. Oneida Alar.  She is not yet on hemodialysis.  CKD is managed by Dr. Candiss Norse.  She is seeing her nephrologist on a 2-week interval.  She denies signs or symptoms of steal syndrome in her left hand.  She states the incision in her AC fossa has healed.  Patient is angry today to hear the news that she will require a second stage basilic vein transposition.  She states she was not notified by Dr. Oneida Alar that she would require additional surgery.  She is unsure when she can schedule a second stage surgery at this time.  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  She will have to check her work schedule prior to proceeding with   Physical Examination   Vitals:   08/12/21 1418  BP: (!) 179/77  Pulse: 80  Resp: 14  Temp: (!) 97.3 F (36.3 C)  TempSrc: Temporal  SpO2: 100%  Weight: 156 lb (70.8 kg)  Height: 5\' 3"  (1.6 m)   Body mass index is 27.63 kg/m.  left arm Incision is healed, palpable radial pulse, hand grip is 5/5, sensation in digits is intact, palpable thrill, bruit can be auscultated     Medical Decision Making   Jackie Wade is a 75 y.o. year old female who presents s/p left first stage basilic vein transposition  Patent basilic vein fistula without signs or symptoms of steal syndrome.  Based on physical exam, fistula seems mature enough to proceed with second stage.  She is seeing her Nephrologist on a 2 week interval and indicates that she is getting closer to initiation of HD.  She does not believe she was told that she would require a second stage surgery and is angry today.  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  I also  explained that Dr. Oneida Alar has since retired and that she will be scheduled with a different surgeon for her second stage.  She is understanding and again states she is willing to proceed.   Dagoberto Ligas PA-C [facility] Office: [phone]  Clinic MD: Virl Cagey on call

## 2021-08-15 DIAGNOSIS — N2581 Secondary hyperparathyroidism of renal origin: Secondary | ICD-10-CM | POA: Diagnosis not present

## 2021-08-15 DIAGNOSIS — D631 Anemia in chronic kidney disease: Secondary | ICD-10-CM | POA: Diagnosis not present

## 2021-08-15 DIAGNOSIS — I770 Arteriovenous fistula, acquired: Secondary | ICD-10-CM | POA: Diagnosis not present

## 2021-08-15 DIAGNOSIS — N185 Chronic kidney disease, stage 5: Secondary | ICD-10-CM | POA: Diagnosis not present

## 2021-08-15 DIAGNOSIS — I120 Hypertensive chronic kidney disease with stage 5 chronic kidney disease or end stage renal disease: Secondary | ICD-10-CM | POA: Diagnosis not present

## 2021-08-18 DIAGNOSIS — I10 Essential (primary) hypertension: Secondary | ICD-10-CM | POA: Diagnosis not present

## 2021-08-18 DIAGNOSIS — E1169 Type 2 diabetes mellitus with other specified complication: Secondary | ICD-10-CM | POA: Diagnosis not present

## 2021-08-18 DIAGNOSIS — Z0001 Encounter for general adult medical examination with abnormal findings: Secondary | ICD-10-CM | POA: Diagnosis not present

## 2021-08-23 ENCOUNTER — Encounter (HOSPITAL_COMMUNITY)
Admission: RE | Admit: 2021-08-23 | Discharge: 2021-08-23 | Disposition: A | Discharge status: Home / Self Care | Payer: Medicare Other | Source: Ambulatory Visit | Attending: Nephrology | Admitting: Nephrology

## 2021-08-23 ENCOUNTER — Other Ambulatory Visit: Payer: Self-pay

## 2021-08-23 VITALS — BP 158/88 | HR 88 | Resp 18

## 2021-08-23 DIAGNOSIS — N185 Chronic kidney disease, stage 5: Secondary | ICD-10-CM | POA: Insufficient documentation

## 2021-08-23 DIAGNOSIS — D631 Anemia in chronic kidney disease: Secondary | ICD-10-CM | POA: Diagnosis not present

## 2021-08-23 LAB — POCT HEMOGLOBIN-HEMACUE: Hemoglobin: 9.1 g/dL — ABNORMAL LOW (ref 12.0–15.0)

## 2021-08-23 MED ORDER — DARBEPOETIN ALFA 100 MCG/0.5ML IJ SOSY
PREFILLED_SYRINGE | INTRAMUSCULAR | Status: AC
  Filled 2021-08-23: qty 0.5

## 2021-08-23 MED ORDER — DARBEPOETIN ALFA 100 MCG/0.5ML IJ SOSY
100.0000 ug | PREFILLED_SYRINGE | INTRAMUSCULAR | Status: DC
  Administered 2021-08-23: 100 ug via SUBCUTANEOUS

## 2021-08-29 DIAGNOSIS — Z23 Encounter for immunization: Secondary | ICD-10-CM | POA: Diagnosis not present

## 2021-08-31 ENCOUNTER — Other Ambulatory Visit: Payer: Self-pay

## 2021-08-31 DIAGNOSIS — N185 Chronic kidney disease, stage 5: Secondary | ICD-10-CM | POA: Diagnosis not present

## 2021-08-31 DIAGNOSIS — D631 Anemia in chronic kidney disease: Secondary | ICD-10-CM | POA: Diagnosis not present

## 2021-08-31 DIAGNOSIS — I120 Hypertensive chronic kidney disease with stage 5 chronic kidney disease or end stage renal disease: Secondary | ICD-10-CM | POA: Diagnosis not present

## 2021-08-31 DIAGNOSIS — R609 Edema, unspecified: Secondary | ICD-10-CM | POA: Diagnosis not present

## 2021-08-31 DIAGNOSIS — N2581 Secondary hyperparathyroidism of renal origin: Secondary | ICD-10-CM | POA: Diagnosis not present

## 2021-09-15 ENCOUNTER — Encounter (HOSPITAL_COMMUNITY): Payer: Self-pay | Admitting: Vascular Surgery

## 2021-09-15 NOTE — Progress Notes (Signed)
Called pt for pre-op call and she states she needs to cancel the surgery. I instructed her to call Dr. Ainsley Spinner office to do that. I gave her the office number to call.

## 2021-09-19 DIAGNOSIS — H903 Sensorineural hearing loss, bilateral: Secondary | ICD-10-CM | POA: Diagnosis not present

## 2021-09-20 ENCOUNTER — Encounter (HOSPITAL_COMMUNITY): Payer: Medicare Other

## 2021-09-29 ENCOUNTER — Encounter (HOSPITAL_COMMUNITY): Payer: Self-pay | Admitting: General Practice

## 2021-09-29 ENCOUNTER — Telehealth: Payer: Self-pay

## 2021-09-29 DIAGNOSIS — N185 Chronic kidney disease, stage 5: Secondary | ICD-10-CM | POA: Diagnosis not present

## 2021-09-29 DIAGNOSIS — R609 Edema, unspecified: Secondary | ICD-10-CM | POA: Diagnosis not present

## 2021-09-29 DIAGNOSIS — I120 Hypertensive chronic kidney disease with stage 5 chronic kidney disease or end stage renal disease: Secondary | ICD-10-CM | POA: Diagnosis not present

## 2021-09-29 DIAGNOSIS — D631 Anemia in chronic kidney disease: Secondary | ICD-10-CM | POA: Diagnosis not present

## 2021-09-29 DIAGNOSIS — N2581 Secondary hyperparathyroidism of renal origin: Secondary | ICD-10-CM | POA: Diagnosis not present

## 2021-09-29 DIAGNOSIS — I770 Arteriovenous fistula, acquired: Secondary | ICD-10-CM | POA: Diagnosis not present

## 2021-09-29 NOTE — Telephone Encounter (Signed)
Received notification from preadmission, that patient was not planning to have surgery on tomorrow.   Spoke with pt. States she wants to get a second opinion from [facility] nephrology clinic before proceeding with left arm second stage BVT surgery and if they advise it's needed, she will contact office back to reschedule.

## 2021-09-30 ENCOUNTER — Ambulatory Visit (HOSPITAL_COMMUNITY): Admission: RE | Admit: 2021-09-30 | Payer: Medicare Other | Source: Home / Self Care | Admitting: Vascular Surgery

## 2021-09-30 ENCOUNTER — Encounter (HOSPITAL_COMMUNITY): Admission: RE | Payer: Self-pay | Source: Home / Self Care

## 2021-09-30 HISTORY — DX: Anemia, unspecified: D64.9

## 2021-09-30 HISTORY — DX: Essential (primary) hypertension: I10

## 2021-09-30 SURGERY — TRANSPOSITION, VEIN, BASILIC
Anesthesia: Choice | Laterality: Left

## 2021-10-18 ENCOUNTER — Encounter (HOSPITAL_COMMUNITY): Payer: Medicare Other

## 2021-10-28 DIAGNOSIS — I770 Arteriovenous fistula, acquired: Secondary | ICD-10-CM | POA: Diagnosis not present

## 2021-10-28 DIAGNOSIS — D631 Anemia in chronic kidney disease: Secondary | ICD-10-CM | POA: Diagnosis not present

## 2021-10-28 DIAGNOSIS — N2581 Secondary hyperparathyroidism of renal origin: Secondary | ICD-10-CM | POA: Diagnosis not present

## 2021-10-28 DIAGNOSIS — D472 Monoclonal gammopathy: Secondary | ICD-10-CM | POA: Diagnosis not present

## 2021-10-28 DIAGNOSIS — N185 Chronic kidney disease, stage 5: Secondary | ICD-10-CM | POA: Diagnosis not present

## 2021-10-28 DIAGNOSIS — I120 Hypertensive chronic kidney disease with stage 5 chronic kidney disease or end stage renal disease: Secondary | ICD-10-CM | POA: Diagnosis not present

## 2021-10-28 DIAGNOSIS — R609 Edema, unspecified: Secondary | ICD-10-CM | POA: Diagnosis not present

## 2021-12-19 ENCOUNTER — Other Ambulatory Visit: Payer: Self-pay

## 2021-12-19 ENCOUNTER — Inpatient Hospital Stay (HOSPITAL_COMMUNITY)
Admission: EM | Admit: 2021-12-19 | Discharge: 2021-12-25 | DRG: 252 | Disposition: A | Discharge status: Home Health | Payer: Medicare Other | Attending: Internal Medicine | Admitting: Internal Medicine

## 2021-12-19 ENCOUNTER — Encounter (HOSPITAL_COMMUNITY): Payer: Self-pay

## 2021-12-19 ENCOUNTER — Emergency Department (HOSPITAL_COMMUNITY): Payer: Medicare Other

## 2021-12-19 DIAGNOSIS — I509 Heart failure, unspecified: Secondary | ICD-10-CM | POA: Diagnosis not present

## 2021-12-19 DIAGNOSIS — I081 Rheumatic disorders of both mitral and tricuspid valves: Secondary | ICD-10-CM | POA: Diagnosis present

## 2021-12-19 DIAGNOSIS — E785 Hyperlipidemia, unspecified: Secondary | ICD-10-CM | POA: Diagnosis present

## 2021-12-19 DIAGNOSIS — I10 Essential (primary) hypertension: Secondary | ICD-10-CM | POA: Diagnosis not present

## 2021-12-19 DIAGNOSIS — Z992 Dependence on renal dialysis: Secondary | ICD-10-CM | POA: Diagnosis not present

## 2021-12-19 DIAGNOSIS — E872 Acidosis, unspecified: Secondary | ICD-10-CM | POA: Diagnosis not present

## 2021-12-19 DIAGNOSIS — I3139 Other pericardial effusion (noninflammatory): Secondary | ICD-10-CM | POA: Diagnosis not present

## 2021-12-19 DIAGNOSIS — N186 End stage renal disease: Secondary | ICD-10-CM | POA: Diagnosis not present

## 2021-12-19 DIAGNOSIS — R0602 Shortness of breath: Secondary | ICD-10-CM

## 2021-12-19 DIAGNOSIS — E1122 Type 2 diabetes mellitus with diabetic chronic kidney disease: Secondary | ICD-10-CM | POA: Diagnosis present

## 2021-12-19 DIAGNOSIS — D631 Anemia in chronic kidney disease: Secondary | ICD-10-CM | POA: Diagnosis present

## 2021-12-19 DIAGNOSIS — N179 Acute kidney failure, unspecified: Secondary | ICD-10-CM | POA: Diagnosis not present

## 2021-12-19 DIAGNOSIS — Y838 Other surgical procedures as the cause of abnormal reaction of the patient, or of later complication, without mention of misadventure at the time of the procedure: Secondary | ICD-10-CM | POA: Diagnosis not present

## 2021-12-19 DIAGNOSIS — R079 Chest pain, unspecified: Secondary | ICD-10-CM | POA: Diagnosis not present

## 2021-12-19 DIAGNOSIS — Z20822 Contact with and (suspected) exposure to covid-19: Secondary | ICD-10-CM | POA: Diagnosis not present

## 2021-12-19 DIAGNOSIS — E1169 Type 2 diabetes mellitus with other specified complication: Secondary | ICD-10-CM | POA: Diagnosis not present

## 2021-12-19 DIAGNOSIS — I5021 Acute systolic (congestive) heart failure: Secondary | ICD-10-CM | POA: Diagnosis not present

## 2021-12-19 DIAGNOSIS — Z794 Long term (current) use of insulin: Secondary | ICD-10-CM

## 2021-12-19 DIAGNOSIS — Z79899 Other long term (current) drug therapy: Secondary | ICD-10-CM

## 2021-12-19 DIAGNOSIS — F419 Anxiety disorder, unspecified: Secondary | ICD-10-CM | POA: Diagnosis present

## 2021-12-19 DIAGNOSIS — R231 Pallor: Secondary | ICD-10-CM | POA: Diagnosis present

## 2021-12-19 DIAGNOSIS — E8779 Other fluid overload: Secondary | ICD-10-CM | POA: Diagnosis not present

## 2021-12-19 DIAGNOSIS — I132 Hypertensive heart and chronic kidney disease with heart failure and with stage 5 chronic kidney disease, or end stage renal disease: Principal | ICD-10-CM | POA: Diagnosis present

## 2021-12-19 DIAGNOSIS — N185 Chronic kidney disease, stage 5: Secondary | ICD-10-CM | POA: Diagnosis present

## 2021-12-19 DIAGNOSIS — R392 Extrarenal uremia: Secondary | ICD-10-CM | POA: Diagnosis not present

## 2021-12-19 DIAGNOSIS — D696 Thrombocytopenia, unspecified: Secondary | ICD-10-CM | POA: Diagnosis not present

## 2021-12-19 DIAGNOSIS — E119 Type 2 diabetes mellitus without complications: Secondary | ICD-10-CM

## 2021-12-19 DIAGNOSIS — I110 Hypertensive heart disease with heart failure: Secondary | ICD-10-CM | POA: Diagnosis not present

## 2021-12-19 DIAGNOSIS — J90 Pleural effusion, not elsewhere classified: Secondary | ICD-10-CM | POA: Diagnosis not present

## 2021-12-19 DIAGNOSIS — T82898A Other specified complication of vascular prosthetic devices, implants and grafts, initial encounter: Secondary | ICD-10-CM | POA: Diagnosis not present

## 2021-12-19 DIAGNOSIS — E8770 Fluid overload, unspecified: Secondary | ICD-10-CM | POA: Diagnosis not present

## 2021-12-19 DIAGNOSIS — I517 Cardiomegaly: Secondary | ICD-10-CM | POA: Diagnosis not present

## 2021-12-19 DIAGNOSIS — I120 Hypertensive chronic kidney disease with stage 5 chronic kidney disease or end stage renal disease: Secondary | ICD-10-CM | POA: Diagnosis not present

## 2021-12-19 DIAGNOSIS — S40022A Contusion of left upper arm, initial encounter: Secondary | ICD-10-CM | POA: Diagnosis not present

## 2021-12-19 DIAGNOSIS — I5030 Unspecified diastolic (congestive) heart failure: Secondary | ICD-10-CM | POA: Diagnosis not present

## 2021-12-19 DIAGNOSIS — D62 Acute posthemorrhagic anemia: Secondary | ICD-10-CM | POA: Diagnosis not present

## 2021-12-19 DIAGNOSIS — I97638 Postprocedural hematoma of a circulatory system organ or structure following other circulatory system procedure: Secondary | ICD-10-CM | POA: Diagnosis not present

## 2021-12-19 LAB — I-STAT VENOUS BLOOD GAS, ED
Acid-base deficit: 7 mmol/L — ABNORMAL HIGH (ref 0.0–2.0)
Bicarbonate: 17.6 mmol/L — ABNORMAL LOW (ref 20.0–28.0)
Calcium, Ion: 1.15 mmol/L (ref 1.15–1.40)
HCT: 21 % — ABNORMAL LOW (ref 36.0–46.0)
Hemoglobin: 7.1 g/dL — ABNORMAL LOW (ref 12.0–15.0)
O2 Saturation: 99 %
Potassium: 4.3 mmol/L (ref 3.5–5.1)
Sodium: 140 mmol/L (ref 135–145)
TCO2: 19 mmol/L — ABNORMAL LOW (ref 22–32)
pCO2, Ven: 30.1 mmHg — ABNORMAL LOW (ref 44.0–60.0)
pH, Ven: 7.374 (ref 7.250–7.430)
pO2, Ven: 129 mmHg — ABNORMAL HIGH (ref 32.0–45.0)

## 2021-12-19 LAB — CBC WITH DIFFERENTIAL/PLATELET
Abs Immature Granulocytes: 0.02 10*3/uL (ref 0.00–0.07)
Basophils Absolute: 0 10*3/uL (ref 0.0–0.1)
Basophils Relative: 1 %
Eosinophils Absolute: 0.1 10*3/uL (ref 0.0–0.5)
Eosinophils Relative: 2 %
HCT: 23.3 % — ABNORMAL LOW (ref 36.0–46.0)
Hemoglobin: 7.7 g/dL — ABNORMAL LOW (ref 12.0–15.0)
Immature Granulocytes: 0 %
Lymphocytes Relative: 18 %
Lymphs Abs: 0.8 10*3/uL (ref 0.7–4.0)
MCH: 28.8 pg (ref 26.0–34.0)
MCHC: 33 g/dL (ref 30.0–36.0)
MCV: 87.3 fL (ref 80.0–100.0)
Monocytes Absolute: 0.3 10*3/uL (ref 0.1–1.0)
Monocytes Relative: 6 %
Neutro Abs: 3.3 10*3/uL (ref 1.7–7.7)
Neutrophils Relative %: 73 %
Platelets: 207 10*3/uL (ref 150–400)
RBC: 2.67 MIL/uL — ABNORMAL LOW (ref 3.87–5.11)
RDW: 15.6 % — ABNORMAL HIGH (ref 11.5–15.5)
WBC: 4.6 10*3/uL (ref 4.0–10.5)
nRBC: 0 % (ref 0.0–0.2)

## 2021-12-19 LAB — BASIC METABOLIC PANEL
Anion gap: 9 (ref 5–15)
BUN: 78 mg/dL — ABNORMAL HIGH (ref 8–23)
CO2: 17 mmol/L — ABNORMAL LOW (ref 22–32)
Calcium: 8.6 mg/dL — ABNORMAL LOW (ref 8.9–10.3)
Chloride: 114 mmol/L — ABNORMAL HIGH (ref 98–111)
Creatinine, Ser: 5.99 mg/dL — ABNORMAL HIGH (ref 0.44–1.00)
GFR, Estimated: 7 mL/min — ABNORMAL LOW (ref >60)
Glucose, Bld: 140 mg/dL — ABNORMAL HIGH (ref 70–99)
Potassium: 4.1 mmol/L (ref 3.5–5.1)
Sodium: 140 mmol/L (ref 135–145)

## 2021-12-19 LAB — I-STAT CHEM 8, ED
BUN: 89 mg/dL — ABNORMAL HIGH (ref 8–23)
Calcium, Ion: 1.16 mmol/L (ref 1.15–1.40)
Chloride: 113 mmol/L — ABNORMAL HIGH (ref 98–111)
Creatinine, Ser: 6.5 mg/dL — ABNORMAL HIGH (ref 0.44–1.00)
Glucose, Bld: 136 mg/dL — ABNORMAL HIGH (ref 70–99)
HCT: 22 % — ABNORMAL LOW (ref 36.0–46.0)
Hemoglobin: 7.5 g/dL — ABNORMAL LOW (ref 12.0–15.0)
Potassium: 4.1 mmol/L (ref 3.5–5.1)
Sodium: 140 mmol/L (ref 135–145)
TCO2: 18 mmol/L — ABNORMAL LOW (ref 22–32)

## 2021-12-19 LAB — RESP PANEL BY RT-PCR (FLU A&B, COVID) ARPGX2
Influenza A by PCR: NEGATIVE
Influenza B by PCR: NEGATIVE
SARS Coronavirus 2 by RT PCR: NEGATIVE

## 2021-12-19 LAB — CBG MONITORING, ED: Glucose-Capillary: 140 mg/dL — ABNORMAL HIGH (ref 70–99)

## 2021-12-19 LAB — BRAIN NATRIURETIC PEPTIDE: B Natriuretic Peptide: 2333.1 pg/mL — ABNORMAL HIGH (ref 0.0–100.0)

## 2021-12-19 MED ORDER — ONDANSETRON HCL 4 MG/2ML IJ SOLN
4.0000 mg | Freq: Four times a day (QID) | INTRAMUSCULAR | Status: DC | PRN
  Administered 2021-12-23 – 2021-12-24 (×2): 4 mg via INTRAVENOUS
  Filled 2021-12-19 (×2): qty 2

## 2021-12-19 MED ORDER — METOLAZONE 5 MG PO TABS
5.0000 mg | ORAL_TABLET | Freq: Once | ORAL | Status: AC
  Administered 2021-12-19: 5 mg via ORAL
  Filled 2021-12-19: qty 1

## 2021-12-19 MED ORDER — HEPARIN SODIUM (PORCINE) 5000 UNIT/ML IJ SOLN
5000.0000 [IU] | Freq: Three times a day (TID) | INTRAMUSCULAR | Status: DC
  Administered 2021-12-19 – 2021-12-25 (×15): 5000 [IU] via SUBCUTANEOUS
  Filled 2021-12-19 (×15): qty 1

## 2021-12-19 MED ORDER — ROSUVASTATIN CALCIUM 5 MG PO TABS
15.0000 mg | ORAL_TABLET | Freq: Every day | ORAL | Status: DC
  Administered 2021-12-20 – 2021-12-25 (×6): 15 mg via ORAL
  Filled 2021-12-19 (×7): qty 3

## 2021-12-19 MED ORDER — SODIUM CHLORIDE 0.9% FLUSH
3.0000 mL | Freq: Two times a day (BID) | INTRAVENOUS | Status: DC
  Administered 2021-12-19 – 2021-12-25 (×10): 3 mL via INTRAVENOUS

## 2021-12-19 MED ORDER — SODIUM CHLORIDE 0.9 % IV SOLN: 250.0000 mL | INTRAVENOUS | Status: DC | PRN

## 2021-12-19 MED ORDER — INSULIN ASPART 100 UNIT/ML IJ SOLN
0.0000 [IU] | Freq: Three times a day (TID) | INTRAMUSCULAR | Status: DC
  Administered 2021-12-23: 1 [IU] via SUBCUTANEOUS

## 2021-12-19 MED ORDER — NITROGLYCERIN 2 % TD OINT
1.0000 [in_us] | TOPICAL_OINTMENT | Freq: Four times a day (QID) | TRANSDERMAL | Status: DC
  Administered 2021-12-20 – 2021-12-21 (×4): 1 [in_us] via TOPICAL
  Filled 2021-12-19: qty 30
  Filled 2021-12-19 (×2): qty 1

## 2021-12-19 MED ORDER — CARVEDILOL 6.25 MG PO TABS
6.2500 mg | ORAL_TABLET | Freq: Two times a day (BID) | ORAL | Status: DC
  Administered 2021-12-19 – 2021-12-25 (×11): 6.25 mg via ORAL
  Filled 2021-12-19 (×2): qty 1
  Filled 2021-12-19: qty 2
  Filled 2021-12-19 (×7): qty 1
  Filled 2021-12-19 (×2): qty 2

## 2021-12-19 MED ORDER — ACETAMINOPHEN 325 MG PO TABS
650.0000 mg | ORAL_TABLET | ORAL | Status: DC | PRN
  Administered 2021-12-23 – 2021-12-24 (×2): 650 mg via ORAL
  Filled 2021-12-19: qty 2

## 2021-12-19 MED ORDER — SODIUM CHLORIDE 0.9% FLUSH: 3.0000 mL | INTRAVENOUS | Status: DC | PRN

## 2021-12-19 MED ORDER — FUROSEMIDE 10 MG/ML IJ SOLN
160.0000 mg | Freq: Once | INTRAVENOUS | Status: AC
  Administered 2021-12-19: 160 mg via INTRAVENOUS
  Filled 2021-12-19: qty 4

## 2021-12-19 NOTE — ED Provider Notes (Signed)
[facility] EMERGENCY DEPARTMENT Provider Note   CSN: [id number] Arrival date & time: 12/19/21  1328     History  Chief Complaint  Patient presents with   Shortness of Breath    Jackie Wade is an ill-appearing 76 y.o. female with a history of chronic kidney disease (stage V), chronic HTN, HLD, and diabetes mellitus type II, and history of anemia of chronic disease presenting today with gradually increasing SOB and DOE over the last 7 to 10 days.  This is accompanied with nausea and 1-2 episodes of vomiting clear liquid, but states she is not currently nauseous.  Patient notes her activity levels and appetite have decreased.  Cold air relieves her chest discomfort, and activity or lying flat makes it worse.  She also notes issues falling asleep over the last 2 days.  Denies loss of consciousness, headache, urinary symptoms, changes in bowel movements, numbness or tingling in the lower extremities, dizziness, chest pain, or palpitations.  Patient was previously seeing Dr. Candiss Norse of [facility] urology, had a fistula placed, then wanted to seek a second opinion.  She has already been encouraged to undergo dialysis, but expresses anxiety and worry over the process.  Patient states "I do not want to do it, but I will do it if it is affecting my heart or I have to".  Plans to get a second opinion from [place] but did not get to make the appointment.  She takes 20 mg of torsemide 2-3 times per day, indapamide, and amlodipine, but she has not taken them in the last 2 days.  The history is provided by the patient, medical records and a friend.  Shortness of Breath Associated symptoms: vomiting and wheezing   Associated symptoms: no abdominal pain, no chest pain, no cough, no diaphoresis, no fever, no headaches, no rash and no sore throat     Home Medications Prior to Admission medications   Medication Sig Start Date End Date Taking? Authorizing Provider  amLODipine (NORVASC) 10  MG tablet Take 1 tablet (10 mg total) by mouth daily. 06/12/21   Nicole Kindred A, DO  blood glucose meter kit and supplies KIT Dispense based on patient and insurance preference. Use up to four times daily as directed. 06/12/21   Ezekiel Slocumb, DO  Continuous Blood Gluc Receiver (FREESTYLE LIBRE 2 READER) DEVI 1 each by Does not apply route 4 (four) times daily -  before meals and at bedtime. Patient not taking: Reported on 08/12/2021 06/12/21   Nicole Kindred A, DO  Continuous Blood Gluc Sensor (FREESTYLE LIBRE 2 SENSOR) MISC 1 each by Does not apply route every 14 (fourteen) days. Patient not taking: Reported on 08/12/2021 06/12/21   Nicole Kindred A, DO  indapamide (LOZOL) 1.25 MG tablet Take 1.25 mg by mouth every morning. 07/25/21   [provider]  insulin aspart (NOVOLOG) 100 UNIT/ML FlexPen Inject 0-9 Units into the skin 3 (three) times daily with meals. Take # of units per sliding scale instructions provided Patient not taking: No sig reported 06/12/21   Nicole Kindred A, DO  Insulin Pen Needle (PEN NEEDLES) 31G X 6 MM MISC 1 each by Does not apply route as needed. Patient not taking: Reported on 08/12/2021 06/12/21   Nicole Kindred A, DO  JANUVIA 100 MG tablet Take 100 mg by mouth daily. 07/08/20   [provider]  metolazone (ZAROXOLYN) 5 MG tablet Take 5 mg by mouth 3 (three) times a week. 08/19/21   [provider]  rosuvastatin (CRESTOR) 5 MG tablet Take 1 tablet (5 mg total) by mouth daily. Patient taking differently: Take 15 mg by mouth daily. 06/12/21   Nicole Kindred A, DO  sevelamer carbonate (RENVELA) 800 MG tablet Take 800 mg by mouth 3 (three) times daily. 07/26/21   [provider]  torsemide (DEMADEX) 20 MG tablet Take 60 mg by mouth 2 (two) times daily. 06/30/21   [provider]  insulin glargine (LANTUS) 100 UNIT/ML Solostar Pen Inject 3 Units into the skin daily. 07/04/21 07/04/21  Jennye Boroughs, MD      Allergies    Patient has no  known allergies.    Review of Systems   Review of Systems  Constitutional:  Positive for activity change, appetite change and fatigue. Negative for chills, diaphoresis and fever.  HENT:  Negative for sore throat.   Eyes:  Negative for pain and visual disturbance.  Respiratory:  Positive for chest tightness, shortness of breath and wheezing. Negative for cough.   Cardiovascular:  Positive for leg swelling. Negative for chest pain and palpitations.  Gastrointestinal:  Positive for nausea and vomiting. Negative for abdominal pain, constipation and diarrhea.  Genitourinary:  Negative for decreased urine volume, difficulty urinating, dyspareunia, dysuria, frequency, hematuria and urgency.  Musculoskeletal:  Negative for arthralgias and back pain.  Skin:  Negative for color change, pallor and rash.  Neurological:  Negative for dizziness, seizures, syncope, light-headedness, numbness and headaches.  Psychiatric/Behavioral:  Negative for confusion. The patient is nervous/anxious.   All other systems reviewed and are negative.  Physical Exam Updated Vital Signs BP (!) 167/89    Pulse 86    Temp (!) 97.4 F (36.3 C) (Oral)    Resp 19    SpO2 94%  Physical Exam Vitals and nursing note reviewed.  Constitutional:      General: She is not in acute distress.    Appearance: She is well-developed. She is ill-appearing. She is not diaphoretic.  HENT:     Head: Normocephalic and atraumatic.     Mouth/Throat:     Mouth: Mucous membranes are moist.  Eyes:     Conjunctiva/sclera: Conjunctivae normal.  Cardiovascular:     Rate and Rhythm: Normal rate and regular rhythm.     Pulses:          Radial pulses are 2+ on the right side and 2+ on the left side.       Posterior tibial pulses are 1+ on the right side and 1+ on the left side.     Heart sounds: No murmur heard.    Comments: AV fistula auscultated and palpated on the left upper extremity Pulmonary:     Effort: Pulmonary effort is normal. No  accessory muscle usage or respiratory distress.     Breath sounds: No stridor.     Comments: Pleural friction sounds noted bilaterally  Chest:     Chest wall: No tenderness or crepitus.  Abdominal:     Palpations: Abdomen is soft.  Musculoskeletal:        General: No swelling.     Cervical back: Neck supple.     Right lower leg: 2+ Pitting Edema present.     Left lower leg: 2+ Pitting Edema present.  Skin:    General: Skin is warm and dry.     Capillary Refill: Capillary refill takes less than 2 seconds.     Coloration: Skin is not jaundiced.  Neurological:     Mental Status: She is alert and oriented to  person, place, and time.  Psychiatric:        Mood and Affect: Mood normal.    ED Results / Procedures / Treatments   Labs (all labs ordered are listed, but only abnormal results are displayed) Labs Reviewed  BASIC METABOLIC PANEL - Abnormal; Notable for the following components:      Result Value   Chloride 114 (*)    CO2 17 (*)    Glucose, Bld 140 (*)    BUN 78 (*)    Creatinine, Ser 5.99 (*)    Calcium 8.6 (*)    GFR, Estimated 7 (*)    All other components within normal limits  CBC WITH DIFFERENTIAL/PLATELET - Abnormal; Notable for the following components:   RBC 2.67 (*)    Hemoglobin 7.7 (*)    HCT 23.3 (*)    RDW 15.6 (*)    All other components within normal limits  BRAIN NATRIURETIC PEPTIDE - Abnormal; Notable for the following components:   B Natriuretic Peptide 2,333.1 (*)    All other components within normal limits  I-STAT CHEM 8, ED - Abnormal; Notable for the following components:   Chloride 113 (*)    BUN 89 (*)    Creatinine, Ser 6.50 (*)    Glucose, Bld 136 (*)    TCO2 18 (*)    Hemoglobin 7.5 (*)    HCT 22.0 (*)    All other components within normal limits  I-STAT VENOUS BLOOD GAS, ED - Abnormal; Notable for the following components:   pCO2, Ven 30.1 (*)    pO2, Ven 129.0 (*)    Bicarbonate 17.6 (*)    TCO2 19 (*)    Acid-base deficit 7.0  (*)    HCT 21.0 (*)    Hemoglobin 7.1 (*)    All other components within normal limits  RESP PANEL BY RT-PCR (FLU A&B, COVID) ARPGX2  URINALYSIS, ROUTINE W REFLEX MICROSCOPIC  BASIC METABOLIC PANEL  HEMOGLOBIN A1C    EKG EKG Interpretation  Date/Time:  Monday December 19 2021 14:08:09 EST Ventricular Rate:  85 PR Interval:  142 QRS Duration: 86 QT Interval:  378 QTC Calculation: 449 R Axis:   -12 Text Interpretation: Normal sinus rhythm Nonspecific ST and T wave abnormality Baseline wander Abnormal ECG Confirmed by Carmin Muskrat [phone]) on 12/19/2021 6:21:57 PM  Radiology DG Chest 2 View  Result Date: 12/19/2021 CLINICAL DATA:  Shortness of breath in a 76 year old female soft. EXAM: CHEST - 2 VIEW COMPARISON:  July 01, 2021. FINDINGS: Cardiomediastinal contours are partially obscured by airspace disease and effusions at the lung bases. Contours grossly stable in unobscured portions compared to previous imaging in this patient with cardiomegaly noted on prior imaging. Obscured LEFT and RIGHT hemidiaphragm. Lungs are otherwise clear with mild increased interstitial prominence. Signs of osteopenia. No acute musculoskeletal finding to the extent evaluated. IMPRESSION: 1. Bilateral effusions and basilar airspace disease likely related to heart failure with basilar volume loss. 2. Increased interstitial markings albeit mild may reflect mild edema. Correlate with evidence of CHF. Electronically Signed   By: Zetta Bills M.D.   On: 12/19/2021 14:35    Procedures Procedures    Medications Ordered in ED Medications - No data to display  ED Course/ Medical Decision Making/ A&P                           Medical Decision Making Amount and/or Complexity of Data Reviewed Labs: ordered. Decision-making details documented  in ED Course. Radiology: ordered and independent interpretation performed. Decision-making details documented in ED Course. ECG/medicine tests: ordered and independent  interpretation performed. Decision-making details documented in ED Course.  76 year old ill-appearing female with a history of chronic kidney disease stage V, chronic hypertension, diabetes mellitus type 2, and HLD presents to the ED for concern of shortness of breath and dyspnea on exertion.  This involves an extensive number of treatment options, and is a complaint that carries with it a high risk of complications and morbidity.  The differential diagnosis includes pleural effusion, pulmonary embolism, myocardial infarction, DKA, pneumonia, and renal failure.  Comorbidities that complicate the patient evaluation include chronic kidney disease stage V, chronic HTN, HLD, and diabetes mellitus type II  Additional history obtained from family friend and internal/external medical records via epic.  I ordered, and personally interpreted labs.  The pertinent results include: BNP of 2,333.1, hemoglobin of 7.7, hematocrit 23.3, BUN of 89, and creatinine of 6.50.  These labs and patient history suggest patient has severely diminished kidney function.  Potassium is 4.1, which is within normal limits.  Glucose is between 136-140, which is not consistent with DKA.  Patient's chloride, CO2, calcium, bicarb from VBG and Chem-7 are also suggestive of severely diminished kidney function to the point of kidney failure.  This is also consistent with the patient's history, presentation, and previous medical records.  COVID and flu swab tests are negative, so pneumonia or upper respiratory infection is unlikely the cause of the patient presentation at this time.  I ordered imaging studies including chest x-ray and EKG.  I independently visualized and interpreted imaging which showed bilateral pleural effusions and edema, which is suggestive of fluid overload due to decreased kidney function.  Cardiomegaly was also noted on chest x-ray, which is concurrent with previous chest x-ray from 06/2021.  I agree with the radiologist  interpretation.  EKG did not show evidence of ischemia or myocardial infarction, as there were no ST depressions or elevations, no T wave ischemia or bundle-branch blocks.  Dispostion: I discussed the patient and their case with my attending, Dr. Vanita Panda, who agreed with the proposed treatment course.  After consideration of the diagnostic results and the patients response to treatment, I feel that the patent would benefit from hospital admission for rhabdomyolysis.  Discussed course of treatment thoroughly with the patient and her close friend, whom demonstrated understanding.   Patient and friend in agreement and have no further questions.        Final Clinical Impression(s) / ED Diagnoses Final diagnoses:  SOB (shortness of breath)  Chronic kidney disease (CKD), stage V [facility])    Rx / DC Orders ED Discharge Orders     None         Candace Cruise 16/94/50 2134    Carmin Muskrat, MD 12/20/21 2350

## 2021-12-19 NOTE — Assessment & Plan Note (Addendum)
Patient has progressed to ESRD.  Patient clinically euvolemic at the physical examination, she has no dyspnea or chest pain. Plan for renal replacement therapy today per nephrology recommendations.  Patient already has a outpatient HD unit, plan for possible discharge home in am.

## 2021-12-19 NOTE — ED Triage Notes (Signed)
Co-workers brought her to the ED for SOB and wheezing. Difficulty with breathing has been going on for a week.

## 2021-12-19 NOTE — ED Provider Triage Note (Signed)
Emergency Medicine Provider Triage Evaluation Note  Jackie Wade , a 76 y.o. female  was evaluated in triage.  Pt complains of shortness of breath that began a week ago.  Patient reports she has a history of hypertension, diabetes and chronic kidney disease with recommended dialysis.  She reports that she wants a "second opinion on the dialysis."  Had a fistula placed but has not yet been used in her left arm.  Denies any chest pain, history of DVT, recent travel and does not smoke.   Review of Systems  Positive: As above Negative: As above  Physical Exam  BP (!) 184/84    Pulse 83    Temp (!) 97.4 F (36.3 C) (Oral)    Resp (!) 30    SpO2 96%  Gen:   Awake, no distress   Resp:  Increased respiratory effort, pausing sentences to breathe. MSK:   Moves extremities without difficulty  Other:  RRR.  No peripheral edema.  Increased respiratory rate and effort.  Appears jaundiced with scleral icterus  Medical Decision Making  Medically screening exam initiated at 1:58 PM.  Appropriate orders placed.  Jackie Wade was informed that the remainder of the evaluation will be completed by another provider, this initial triage assessment does not replace that evaluation, and the importance of remaining in the ED until their evaluation is complete.     Jackie Hammock, PA-C 12/19/21 1401

## 2021-12-19 NOTE — Assessment & Plan Note (Addendum)
Continue blood pressure control with carvedilol.  Systolic blood pressure has been 100 to 130's

## 2021-12-19 NOTE — H&P (Signed)
History and Physical    Patient: Jackie Wade DVV:[id number] DOB: 27-Jul-1946 DOA: 12/19/2021 DOS: the patient was seen and examined on 12/19/2021 PCP: Lucianne Lei, MD  Patient coming from: Home  Chief Complaint: SOB  HPI: [name] is a 76 y.o. female with medical history significant of DM2, HTN, CKD 5 fistula in place but hasnt started dialysis yet, anemia of CKD.  Pt presents to ED with gradually worsening SOB, DOE over past 7-10 days.    This is accompanied with nausea and 1-2 episodes of vomiting clear liquid, but states she is not currently nauseous.  Patient notes her activity levels and appetite have decreased.  Patient was previously seeing Dr. Candiss Norse of [facility] urology, had a fistula placed, then wanted to seek a second opinion.  She has already been encouraged to undergo dialysis, but expresses anxiety and worry over the process.  Patient states "I do not want to do it, but I will do it if it is affecting my heart or I have to".  Plans to get a second opinion from [place] but did not get to make the appointment.      Review of Systems: As mentioned in the history of present illness. All other systems reviewed and are negative. Past Medical History:  Diagnosis Date   Anemia    Chronic kidney disease (CKD), stage IV (severe) (HCC)    Diabetes mellitus without complication ([place])    Hypertension    Renal disorder    Past Surgical History:  Procedure Laterality Date   BASCILIC VEIN TRANSPOSITION Left 07/04/2021   Procedure: LEFT ARM BASCILIC VEIN TRANSPOSITION 1ST STAGE;  Surgeon: Elam Dutch, MD;  Location: [facility];  Service: Vascular;  Laterality: Left;   WRIST SURGERY     Social History:  reports that she has never smoked. She has never used smokeless tobacco. She reports that she does not drink alcohol and does not use drugs.  No Known Allergies  Family History  Problem Relation Age of Onset   Premature CHD Neg Hx     Prior to Admission medications    Medication Sig Start Date End Date Taking? Authorizing Provider  JANUVIA 100 MG tablet Take 100 mg by mouth daily. 07/08/20  Yes [provider]  metolazone (ZAROXOLYN) 5 MG tablet Take 5 mg by mouth 3 (three) times a week. 08/19/21  Yes [provider]  rosuvastatin (CRESTOR) 5 MG tablet Take 1 tablet (5 mg total) by mouth daily. Patient taking differently: Take 15 mg by mouth daily. 06/12/21  Yes Nicole Kindred A, DO  torsemide (DEMADEX) 20 MG tablet Take 60 mg by mouth 2 (two) times daily. 06/30/21  Yes [provider]  sevelamer carbonate (RENVELA) 800 MG tablet Take 800 mg by mouth 3 (three) times daily. Patient not taking: Reported on 12/19/2021 07/26/21   [provider]  insulin glargine (LANTUS) 100 UNIT/ML Solostar Pen Inject 3 Units into the skin daily. 07/04/21 07/04/21  Jennye Boroughs, MD    Physical Exam: Vitals:   12/19/21 1830 12/19/21 1900 12/19/21 1930 12/19/21 2000  BP:  (!) 173/80 (!) 171/76 (!) 159/77  Pulse:  88 87 86  Resp:  (!) 22 19 17   Temp:      TempSrc:      SpO2:  97% 98% 98%  Weight: 64 kg     Height: 5\' 3"  (1.6 m)      Constitutional: Ill appearing Eyes: PERRL, lids and conjunctivae normal ENMT: Mucous membranes are moist. Posterior pharynx clear  of any exudate or lesions.Normal dentition.  Neck: normal, supple, no masses, no thyromegaly Respiratory: Pursed lip breathing, diminished at bases. Cardiovascular: RRR, AV fistula LUE, 2+ BLE edema Abdomen: no tenderness, no masses palpated. No hepatosplenomegaly. Bowel sounds positive.  Musculoskeletal: no clubbing / cyanosis. No joint deformity upper and lower extremities. Good ROM, no contractures. Normal muscle tone.  Skin: no rashes, lesions, ulcers. No induration Neurologic: CN 2-12 grossly intact. Sensation intact, DTR normal. Strength 5/5 in all 4.  Psychiatric: Normal judgment and insight. Alert and oriented x 3. Normal mood.    Data Reviewed:  CHF findings on  CXR.  Assessment/Plan * New onset of congestive heart failure (HCC) Volume overload from non-functioning kidneys. Will try 160mg  IV lasix x1 And 5mg  metalozone x1 (looks like she was already on this 3 times weekly already) But I doubt her kidneys will be able to respond well, I suspect pt most likely just needs to start dialysis. Sounds like Dr. Joelyn Oms agrees, nephrology planning on seeing patient and probably starting dialysis in AM. CHF pathway 2d echo Strict intake and output Tele monitor  CKD (chronic kidney disease) stage 5, GFR less than 15 ml/min ([place])- (present on admission) Nephro to see in AM, pt likely needs to start dialysis.  Anemia due to chronic kidney disease- (present on admission) Management per nephrologist.  Essential hypertension- (present on admission) Pt has been off of norvasc recently. Will start on coreg instead in setting of CHF.    Advance Care Planning:   Code Status: Full Code  Consults: Nephro  Family Communication: Family at bedside  Severity of Illness: The appropriate patient status for this patient is INPATIENT. Inpatient status is judged to be reasonable and necessary in order to provide the required intensity of service to ensure the patient's safety. The patient's presenting symptoms, physical exam findings, and initial radiographic and laboratory data in the context of their chronic comorbidities is felt to place them at high risk for further clinical deterioration. Furthermore, it is not anticipated that the patient will be medically stable for discharge from the hospital within 2 midnights of admission.   * I certify that at the point of admission it is my clinical judgment that the patient will require inpatient hospital care spanning beyond 2 midnights from the point of admission due to high intensity of service, high risk for further deterioration and high frequency of surveillance required.*  Author: Etta Quill., DO 12/19/2021  9:29 PM  For on call review [email or web address].

## 2021-12-19 NOTE — Assessment & Plan Note (Addendum)
Iron panel with iron 27, TIBC 246, transferrin saturation 11 and ferritin 285.   Continue with Iron and Epo supplementation.

## 2021-12-19 NOTE — ED Notes (Signed)
Per family member, patient is starting to becoming nauseated. Emesis bag provided.

## 2021-12-19 NOTE — Assessment & Plan Note (Addendum)
Hypervolemia mainly due to renal failure. Echocardiogram with preserved LV systolic function with EF 40 to 40% with global hypokinesis, RV systolic function preserved. Small pericardial effusion. Mild to moderate mitral valve regurgitation, tricuspid valve with moderate regurgitation.   Clinically euvolemic.  Continue fluid management through hemodialysis and ultrafiltration Continue blood pressure monitoring.  Medical therapy with carvedilol.

## 2021-12-20 ENCOUNTER — Inpatient Hospital Stay (HOSPITAL_COMMUNITY): Payer: Medicare Other

## 2021-12-20 DIAGNOSIS — Z79899 Other long term (current) drug therapy: Secondary | ICD-10-CM

## 2021-12-20 DIAGNOSIS — E1122 Type 2 diabetes mellitus with diabetic chronic kidney disease: Secondary | ICD-10-CM

## 2021-12-20 DIAGNOSIS — I5030 Unspecified diastolic (congestive) heart failure: Secondary | ICD-10-CM

## 2021-12-20 DIAGNOSIS — N186 End stage renal disease: Secondary | ICD-10-CM

## 2021-12-20 DIAGNOSIS — Z794 Long term (current) use of insulin: Secondary | ICD-10-CM

## 2021-12-20 LAB — ECHOCARDIOGRAM COMPLETE
AR max vel: 2.43 cm2
AV Peak grad: 9.1 mmHg
Ao pk vel: 1.51 m/s
Area-P 1/2: 4.26 cm2
Height: 63 in
S' Lateral: 4 cm
Single Plane A4C EF: 36.2 %
Weight: 2256 oz

## 2021-12-20 LAB — URINALYSIS, ROUTINE W REFLEX MICROSCOPIC
Bilirubin Urine: NEGATIVE
Glucose, UA: NEGATIVE mg/dL
Ketones, ur: NEGATIVE mg/dL
Leukocytes,Ua: NEGATIVE
Nitrite: NEGATIVE
Protein, ur: 100 mg/dL — AB
Specific Gravity, Urine: 1.008 (ref 1.005–1.030)
pH: 5 (ref 5.0–8.0)

## 2021-12-20 LAB — CBG MONITORING, ED
Glucose-Capillary: 92 mg/dL (ref 70–99)
Glucose-Capillary: 119 mg/dL — ABNORMAL HIGH (ref 70–99)
Glucose-Capillary: 89 mg/dL (ref 70–99)

## 2021-12-20 LAB — BASIC METABOLIC PANEL
Anion gap: 12 (ref 5–15)
BUN: 73 mg/dL — ABNORMAL HIGH (ref 8–23)
CO2: 14 mmol/L — ABNORMAL LOW (ref 22–32)
Calcium: 8.6 mg/dL — ABNORMAL LOW (ref 8.9–10.3)
Chloride: 115 mmol/L — ABNORMAL HIGH (ref 98–111)
Creatinine, Ser: 6.05 mg/dL — ABNORMAL HIGH (ref 0.44–1.00)
GFR, Estimated: 7 mL/min — ABNORMAL LOW (ref >60)
Glucose, Bld: 89 mg/dL (ref 70–99)
Potassium: 4.8 mmol/L (ref 3.5–5.1)
Sodium: 141 mmol/L (ref 135–145)

## 2021-12-20 LAB — HEMOGLOBIN A1C
Hgb A1c MFr Bld: 6.1 % — ABNORMAL HIGH (ref 4.8–5.6)
Mean Plasma Glucose: 128 mg/dL

## 2021-12-20 LAB — HEPATITIS B SURFACE ANTIBODY,QUALITATIVE: Hep B S Ab: NONREACTIVE

## 2021-12-20 MED ORDER — DARBEPOETIN ALFA 200 MCG/0.4ML IJ SOSY
200.0000 ug | PREFILLED_SYRINGE | INTRAMUSCULAR | Status: DC
  Filled 2021-12-20: qty 0.4

## 2021-12-20 MED ORDER — FUROSEMIDE 10 MG/ML IJ SOLN
160.0000 mg | Freq: Two times a day (BID) | INTRAVENOUS | Status: DC
  Administered 2021-12-20 – 2021-12-22 (×4): 160 mg via INTRAVENOUS
  Filled 2021-12-20 (×4): qty 10
  Filled 2021-12-20: qty 16

## 2021-12-20 NOTE — Progress Notes (Addendum)
Contacted by Dr Moshe Cipro regarding pt's need for out-pt HD at d/c. Spoke to pt via phone and she is to return call to navigator once family is finished visiting. Pt is requesting 3rd shift at [facility] due to being employed. Contacted [facility] admissions to check availability for that shift.   Melven Sartorius Renal Navigator [phone]   Addendum at 4:45 pm: Spoke to pt via phone. Introduced self and explained role. Pt confirms interest in [place] 3rd shift. Explained to pt that [facility] admissions was contacted this afternoon and there is no availability for 3rd shift at this time. Pt also advised that no other [facility] clinic provides third shift treatments at this time. Discussed possible schedules/shifts. Pt prefers a MWF early first shift if possible. This will allow pt to go to work after treatment should she feel up to it. Pt prefers [facility] but is willing to go to closest clinic to pt's home that will allow her this schedule. Referral submitted to [facility] admissions this afternoon. Will submit Hep B results once they are available.Pt plans to drive self to appts but states she has friends that can assist if needed. Will assist as needed.

## 2021-12-20 NOTE — Plan of Care (Signed)

## 2021-12-20 NOTE — Consult Note (Signed)
[facility] Renal Consultation Note  Requesting MD: Ghimire Indication for Consultation: Advanced CKD , volume overload and uremia   HPI:  Jackie Wade is a 76 y.o. female with past medical history significant for hypertension, type 2 diabetes mellitus and resultant CKD-followed by Dr. Candiss Norse at [facility].  She has been slow to except her illness and was still working full-time as an Forensic psychologist.  She did end up getting aVF access surgery in August of 2022.  When she presented for follow-up she was shocked to hear that she would need a second stage procedure so she essentially refused it.  She also wanted a second opinion at [facility] so that was in the works but it never actually got done.  She has been referred to [facility] for transplant evaluation but again that has not been started.  It appears in talking with Dr. Candiss Norse that patient had been having uremic symptoms but again was hesitant to accept her illness or her fate.  The issue that brought her to medical attention this time was dyspnea on exertion orthopnea and PND.  She went to her primary physician, was found to be hypoxic so sent here late yesterday.  Labs showed creatinine of 6 and GFR of 7, hemoglobin of 7.7.  Chest x-ray showed bilateral effusions with airspace disease consistent with pulmonary edema.  Patient was given Lasix and feels better but understands the issue now.  She is ready to accept dialysis as she wants to feel better and she wants to continue working  Creatinine, Ser  Date/Time Value Ref Range Status  12/20/2021 06:50 AM 6.05 (H) 0.44 - 1.00 mg/dL Final  12/19/2021 02:43 PM 6.50 (H) 0.44 - 1.00 mg/dL Final  12/19/2021 01:58 PM 5.99 (H) 0.44 - 1.00 mg/dL Final  07/04/2021 03:07 AM 3.93 (H) 0.44 - 1.00 mg/dL Final  07/03/2021 03:36 AM 3.88 (H) 0.44 - 1.00 mg/dL Final  07/02/2021 03:45 AM 4.10 (H) 0.44 - 1.00 mg/dL Final  07/01/2021 04:05 PM 4.42 (H) 0.44 - 1.00 mg/dL Final  06/11/2021 01:14 AM 4.42  (H) 0.44 - 1.00 mg/dL Final  06/10/2021 07:23 AM 4.63 (H) 0.44 - 1.00 mg/dL Final  06/10/2021 05:32 AM 4.47 (H) 0.44 - 1.00 mg/dL Final  06/10/2021 12:32 AM 4.74 (H) 0.44 - 1.00 mg/dL Final  06/09/2021 07:41 PM 4.82 (H) 0.44 - 1.00 mg/dL Final    Comment:    DELTA CHECK NOTED  06/09/2021 04:52 PM 4.87 (H) 0.44 - 1.00 mg/dL Final  06/09/2021 01:28 PM 5.02 (H) 0.44 - 1.00 mg/dL Final  09/09/2015 01:19 PM 1.13 (H) 0.44 - 1.00 mg/dL Final  03/04/2012 02:16 PM 1.00 0.50 - 1.10 mg/dL Final     PMHx:   Past Medical History:  Diagnosis Date   Anemia    Chronic kidney disease (CKD), stage IV (severe) ([place])    Diabetes mellitus without complication ([place])    Hypertension    Renal disorder     Past Surgical History:  Procedure Laterality Date   BASCILIC VEIN TRANSPOSITION Left 07/04/2021   Procedure: LEFT ARM BASCILIC VEIN TRANSPOSITION 1ST STAGE;  Surgeon: Elam Dutch, MD;  Location: [facility] OR;  Service: Vascular;  Laterality: Left;   WRIST SURGERY      Family Hx:  Family History  Problem Relation Age of Onset   Premature CHD Neg Hx     Social History:  reports that she has never smoked. She has never used smokeless tobacco. She reports that she does not drink alcohol and  does not use drugs.  Allergies: No Known Allergies  Medications: Prior to Admission medications   Medication Sig Start Date End Date Taking? Authorizing Provider  JANUVIA 100 MG tablet Take 100 mg by mouth daily. 07/08/20  Yes [provider]  metolazone (ZAROXOLYN) 5 MG tablet Take 5 mg by mouth 3 (three) times a week. 08/19/21  Yes [provider]  rosuvastatin (CRESTOR) 5 MG tablet Take 1 tablet (5 mg total) by mouth daily. Patient taking differently: Take 15 mg by mouth daily. 06/12/21  Yes Nicole Kindred A, DO  torsemide (DEMADEX) 20 MG tablet Take 60 mg by mouth 2 (two) times daily. 06/30/21  Yes [provider]  sevelamer carbonate (RENVELA) 800 MG tablet Take 800 mg by mouth 3  (three) times daily. Patient not taking: Reported on 12/19/2021 07/26/21   [provider]  insulin glargine (LANTUS) 100 UNIT/ML Solostar Pen Inject 3 Units into the skin daily. 07/04/21 07/04/21  Jennye Boroughs, MD    I have reviewed the patient's current medications.  Labs:  Results for orders placed or performed during the hospital encounter of 12/19/21 (from the past 48 hour(s))  Urinalysis, Routine w reflex microscopic Urine, Clean Catch     Status: Abnormal   Collection Time: 12/19/21  1:37 AM  Result Value Ref Range   Color, Urine STRAW (A) YELLOW   APPearance CLEAR CLEAR   Specific Gravity, Urine 1.008 1.005 - 1.030   pH 5.0 5.0 - 8.0   Glucose, UA NEGATIVE NEGATIVE mg/dL   Hgb urine dipstick MODERATE (A) NEGATIVE   Bilirubin Urine NEGATIVE NEGATIVE   Ketones, ur NEGATIVE NEGATIVE mg/dL   Protein, ur 100 (A) NEGATIVE mg/dL   Nitrite NEGATIVE NEGATIVE   Leukocytes,Ua NEGATIVE NEGATIVE   RBC / HPF 0-5 0 - 5 RBC/hpf   WBC, UA 0-5 0 - 5 WBC/hpf   Bacteria, UA RARE (A) NONE SEEN   Squamous Epithelial / LPF 0-5 0 - 5   Mucus PRESENT     Comment: Performed at [facility], [address], [place], [place] [address]  Basic metabolic panel     Status: Abnormal   Collection Time: 12/19/21  1:58 PM  Result Value Ref Range   Sodium 140 135 - 145 mmol/L   Potassium 4.1 3.5 - 5.1 mmol/L   Chloride 114 (H) 98 - 111 mmol/L   CO2 17 (L) 22 - 32 mmol/L   Glucose, Bld 140 (H) 70 - 99 mg/dL    Comment: Glucose reference range applies only to samples taken after fasting for at least 8 hours.   BUN 78 (H) 8 - 23 mg/dL   Creatinine, Ser 5.99 (H) 0.44 - 1.00 mg/dL   Calcium 8.6 (L) 8.9 - 10.3 mg/dL   GFR, Estimated 7 (L) >60 mL/min    Comment: (NOTE) Calculated using the CKD-EPI Creatinine Equation (2021)    Anion gap 9 5 - 15    Comment: Performed at [facility] [address] [place], [place] [address]  CBC WITH DIFFERENTIAL     Status: Abnormal   Collection Time:  12/19/21  1:58 PM  Result Value Ref Range   WBC 4.6 4.0 - 10.5 K/uL   RBC 2.67 (L) 3.87 - 5.11 MIL/uL   Hemoglobin 7.7 (L) 12.0 - 15.0 g/dL   HCT 23.3 (L) 36.0 - 46.0 %   MCV 87.3 80.0 - 100.0 fL   MCH 28.8 26.0 - 34.0 pg   MCHC 33.0 30.0 - 36.0  g/dL   RDW 15.6 (H) 11.5 - 15.5 %   Platelets 207 150 - 400 K/uL   nRBC 0.0 0.0 - 0.2 %   Neutrophils Relative % 73 %   Neutro Abs 3.3 1.7 - 7.7 K/uL   Lymphocytes Relative 18 %   Lymphs Abs 0.8 0.7 - 4.0 K/uL   Monocytes Relative 6 %   Monocytes Absolute 0.3 0.1 - 1.0 K/uL   Eosinophils Relative 2 %   Eosinophils Absolute 0.1 0.0 - 0.5 K/uL   Basophils Relative 1 %   Basophils Absolute 0.0 0.0 - 0.1 K/uL   Immature Granulocytes 0 %   Abs Immature Granulocytes 0.02 0.00 - 0.07 K/uL    Comment: Performed at [facility] [address], [place], [place] [address]  Brain natriuretic peptide     Status: Abnormal   Collection Time: 12/19/21  1:58 PM  Result Value Ref Range   B Natriuretic Peptide 2,333.1 (H) 0.0 - 100.0 pg/mL    Comment: Performed at [facility] [address], [place], [place] [address]  Resp Panel by RT-PCR (Flu A&B, Covid) Nasopharyngeal Swab     Status: None   Collection Time: 12/19/21  1:59 PM   Specimen: Nasopharyngeal Swab; Nasopharyngeal(NP) swabs in vial transport medium  Result Value Ref Range   SARS Coronavirus 2 by RT PCR NEGATIVE NEGATIVE    Comment: (NOTE) SARS-CoV-2 target nucleic acids are NOT DETECTED.  The SARS-CoV-2 RNA is generally detectable in upper respiratory specimens during the acute phase of infection. The lowest concentration of SARS-CoV-2 viral copies this assay can detect is 138 copies/mL. A negative result does not preclude SARS-Cov-2 infection and should not be used as the sole basis for treatment or other patient management decisions. A negative result may occur with  improper specimen collection/handling, submission of specimen other than nasopharyngeal swab, presence of  viral mutation(s) within the areas targeted by this assay, and inadequate number of viral copies(<138 copies/mL). A negative result must be combined with clinical observations, patient history, and epidemiological information. The expected result is Negative.  Fact Sheet for Patients:  EntrepreneurPulse.com.au  Fact Sheet for Healthcare Providers:  IncredibleEmployment.be  This test is no t yet approved or cleared by the [place] FDA and  has been authorized for detection and/or diagnosis of SARS-CoV-2 by FDA under an Emergency Use Authorization (EUA). This EUA will remain  in effect (meaning this test can be used) for the duration of the COVID-19 declaration under Section 564(b)(1) of the Act, 21 U.S.C.section 360bbb-3(b)(1), unless the authorization is terminated  or revoked sooner.       Influenza A by PCR NEGATIVE NEGATIVE   Influenza B by PCR NEGATIVE NEGATIVE    Comment: (NOTE) The Xpert Xpress SARS-CoV-2/FLU/RSV plus assay is intended as an aid in the diagnosis of influenza from Nasopharyngeal swab specimens and should not be used as a sole basis for treatment. Nasal washings and aspirates are unacceptable for Xpert Xpress SARS-CoV-2/FLU/RSV testing.  Fact Sheet for Patients: EntrepreneurPulse.com.au  Fact Sheet for Healthcare Providers: IncredibleEmployment.be  This test is not yet approved or cleared by the [place] FDA and has been authorized for detection and/or diagnosis of SARS-CoV-2 by FDA under an Emergency Use Authorization (EUA). This EUA will remain in effect (meaning this test can be used) for the duration of the COVID-19 declaration under Section 564(b)(1) of the Act, 21 U.S.C. section 360bbb-3(b)(1), unless the authorization is terminated or revoked.  Performed at [facility],  [address], [place], [place] [address]   I-Stat Chem 8, ED ([facility], [facility], [facility] only)      Status: Abnormal   Collection Time: 12/19/21  2:43 PM  Result Value Ref Range   Sodium 140 135 - 145 mmol/L   Potassium 4.1 3.5 - 5.1 mmol/L   Chloride 113 (H) 98 - 111 mmol/L   BUN 89 (H) 8 - 23 mg/dL   Creatinine, Ser 6.50 (H) 0.44 - 1.00 mg/dL   Glucose, Bld 136 (H) 70 - 99 mg/dL    Comment: Glucose reference range applies only to samples taken after fasting for at least 8 hours.   Calcium, Ion 1.16 1.15 - 1.40 mmol/L   TCO2 18 (L) 22 - 32 mmol/L   Hemoglobin 7.5 (L) 12.0 - 15.0 g/dL   HCT 22.0 (L) 36.0 - 46.0 %  I-Stat venous blood gas, ([facility])     Status: Abnormal   Collection Time: 12/19/21  2:44 PM  Result Value Ref Range   pH, Ven 7.374 7.250 - 7.430   pCO2, Ven 30.1 (L) 44.0 - 60.0 mmHg   pO2, Ven 129.0 (H) 32.0 - 45.0 mmHg   Bicarbonate 17.6 (L) 20.0 - 28.0 mmol/L   TCO2 19 (L) 22 - 32 mmol/L   O2 Saturation 99.0 %   Acid-base deficit 7.0 (H) 0.0 - 2.0 mmol/L   Sodium 140 135 - 145 mmol/L   Potassium 4.3 3.5 - 5.1 mmol/L   Calcium, Ion 1.15 1.15 - 1.40 mmol/L   HCT 21.0 (L) 36.0 - 46.0 %   Hemoglobin 7.1 (L) 12.0 - 15.0 g/dL   Sample type VENOUS   CBG monitoring, ED     Status: Abnormal   Collection Time: 12/19/21 10:51 PM  Result Value Ref Range   Glucose-Capillary 140 (H) 70 - 99 mg/dL    Comment: Glucose reference range applies only to samples taken after fasting for at least 8 hours.  Basic metabolic panel     Status: Abnormal   Collection Time: 12/20/21  6:50 AM  Result Value Ref Range   Sodium 141 135 - 145 mmol/L   Potassium 4.8 3.5 - 5.1 mmol/L   Chloride 115 (H) 98 - 111 mmol/L   CO2 14 (L) 22 - 32 mmol/L   Glucose, Bld 89 70 - 99 mg/dL    Comment: Glucose reference range applies only to samples taken after fasting for at least 8 hours.   BUN 73 (H) 8 - 23 mg/dL   Creatinine, Ser 6.05 (H) 0.44 - 1.00 mg/dL   Calcium 8.6 (L) 8.9 - 10.3 mg/dL   GFR, Estimated 7 (L) >60 mL/min    Comment: (NOTE) Calculated using the CKD-EPI Creatinine Equation (2021)     Anion gap 12 5 - 15    Comment: Performed at [facility] [address], [place], [place] [address]  CBG monitoring, ED     Status: None   Collection Time: 12/20/21  7:42 AM  Result Value Ref Range   Glucose-Capillary 92 70 - 99 mg/dL    Comment: Glucose reference range applies only to samples taken after fasting for at least 8 hours.  CBG monitoring, ED     Status: Abnormal   Collection Time: 12/20/21 12:17 PM  Result Value Ref Range   Glucose-Capillary 119 (H) 70 - 99 mg/dL    Comment: Glucose reference range applies only to samples taken after fasting for at least 8 hours.     ROS:  A  comprehensive review of systems was negative except for: Constitutional: positive for fatigue Respiratory: positive for dyspnea on exertion and orthopnea, PND Gastrointestinal: positive for nausea and vomiting  Physical Exam: Vitals:   12/20/21 1000 12/20/21 1245  BP: 132/67 (!) 147/73  Pulse: 75 79  Resp: (!) 22 15  Temp:    SpO2: 100% 100%     General: Thin, fairly well appearing with diabetes, hypertension and advanced CKD who is alert and in no acute distress HEENT: Pupils are equal round reactive to light, extraocular motions are intact, mucous membranes are moist Neck: There is no JVD Heart: Regular rate and rhythm Lungs: Decreased breath sounds at the bases Abdomen: Soft, nontender, nondistended Extremities: Minimal peripheral edema-left upper arm AV fistula with positive thrill and bruit.  Vein seems deep Skin: Warm and dry Neuro: Alert and nonfocal  Assessment/Plan: 76 year old black female with advanced CKD now with symptoms of volume overload and uremia 1.Renal- advanced CKD with uremic symptoms, low albumin, volume overload as well as anemia.  All of these issues giving her symptoms at this time.  She now understands that she will need dialysis in order to feel better.  Agrees to the second stage of her BVT and tunneled dialysis catheter placement as well as initiation  of dialysis-  I have a message into [facility] to get done.  Patient would like to do third shift at [facility] if possible-have informed renal navigator and renal educator of patient's presence in the hospital.  Once outpatient spot has been established she would be able to be discharged.  Do not anticipate a long hospitalization here-maybe after just 1 HD treatment 2. Hypertension/volume  -is overloaded and having symptoms-did respond to Lasix dosing last night.  We will continue it every 12 hours until we can establish dialysis 3. Anemia  -this is significant and likely contributing to her symptoms.  I will give ESA and check iron stores are replete as needed-trying to avoid transfusion as patient might be candidate for kidney transplant in the near future 4. Bones-we will check calcium phosphorus and PTH and act as needed   Louis Meckel 12/20/2021, 1:19 PM

## 2021-12-20 NOTE — ED Notes (Signed)
1L [place] removed at this time. Pt denies SOB currently and reports [place] is bothering her nose. Will monitor. Pt verbalizes understanding to use call bell if she feels like she needs oxygen placed back on.

## 2021-12-20 NOTE — ED Notes (Signed)
Breakfast orders placed 

## 2021-12-20 NOTE — Progress Notes (Signed)
Echocardiogram 2D Echocardiogram has been performed.  Jackie Wade 12/20/2021, 12:29 PM

## 2021-12-20 NOTE — Consult Note (Addendum)
Hospital Consult  VASCULAR SURGERY ASSESSMENT & PLAN:   END-STAGE RENAL DISEASE: The patient has an excellent thrill in her left first stage basilic vein transposition.  This was done on 07/04/2021.  The patient is now agreeable to proceed with a second stage basilic vein transposition.  She will also need a tunneled dialysis catheter.  Tomorrow schedule is full.  We have scheduled this for Thursday late morning.  The procedure and potential complications have been discussed with the patient.  Gae Gallop, MD 3:24 PM   Reason for Consult:  dialysis access Requesting Physician:  Moshe Cipro MRN #:  [id number]  History of Present Illness: This is a 76 y.o. female who previously underwent left 1st stage BVT on 07/04/2021 by Dr. Oneida Alar.  She was seen back in September and it was felt that she was ready for a 2nd stage, however, she was upset about having a second stage but did agree upon it and wanted to check on her schedule but was not done.   She presented to the hospital with gradually worsening SOB and DOE over the past week or so.  She has had nausea with a couple of episodes of vomiting.  Pt had been followed by Dr. Candiss Norse with [facility] but was planning on getting a second opinion at [facility], but did not get a chance to make this appt.   VVS is consulted for 2nd stage BVT and TDC placement.  She states that she knows she has to undergo dialysis to live and she does not want to die.  She states she has 4 friends that have called her to offer to get tested for kidney transplant.   She denies any pain or numbness in her left hand.   The pt is on a statin for cholesterol management.  The pt is not on a daily aspirin.   Other AC:  none The pt is not on medication for hypertension.   The pt is diabetic.   Tobacco hx:  never  Past Medical History:  Diagnosis Date   Anemia    Chronic kidney disease (CKD), stage IV (severe) ([facility])    Diabetes mellitus without complication ([place])     Hypertension    Renal disorder     Past Surgical History:  Procedure Laterality Date   BASCILIC VEIN TRANSPOSITION Left 07/04/2021   Procedure: LEFT ARM BASCILIC VEIN TRANSPOSITION 1ST STAGE;  Surgeon: Elam Dutch, MD;  Location: [facility] OR;  Service: Vascular;  Laterality: Left;   WRIST SURGERY      No Known Allergies  Prior to Admission medications   Medication Sig Start Date End Date Taking? Authorizing Provider  JANUVIA 100 MG tablet Take 100 mg by mouth daily. 07/08/20  Yes [provider]  metolazone (ZAROXOLYN) 5 MG tablet Take 5 mg by mouth 3 (three) times a week. 08/19/21  Yes [provider]  rosuvastatin (CRESTOR) 5 MG tablet Take 1 tablet (5 mg total) by mouth daily. Patient taking differently: Take 15 mg by mouth daily. 06/12/21  Yes Nicole Kindred A, DO  torsemide (DEMADEX) 20 MG tablet Take 60 mg by mouth 2 (two) times daily. 06/30/21  Yes [provider]  sevelamer carbonate (RENVELA) 800 MG tablet Take 800 mg by mouth 3 (three) times daily. Patient not taking: Reported on 12/19/2021 07/26/21   [provider]  insulin glargine (LANTUS) 100 UNIT/ML Solostar Pen Inject 3 Units into the skin daily. 07/04/21 07/04/21  Jennye Boroughs, MD    Social History  Socioeconomic History   Marital status: Married    Spouse name: Not on file   Number of children: Not on file   Years of education: Not on file   Highest education level: Not on file  Occupational History   Not on file  Tobacco Use   Smoking status: Never   Smokeless tobacco: Never  Vaping Use   Vaping Use: Never used  Substance and Sexual Activity   Alcohol use: No   Drug use: No   Sexual activity: Not on file  Other Topics Concern   Not on file  Social History Narrative   Not on file   Social Determinants of Health   Financial Resource Strain: Not on file  Food Insecurity: Not on file  Transportation Needs: Not on file  Physical Activity: Not on file  Stress: Not on file   Social Connections: Not on file  Intimate Partner Violence: Not on file     Family History  Problem Relation Age of Onset   Premature CHD Neg Hx     ROS: [x]  Positive   [ ]  Negative   [ ]  All sytems reviewed and are negative Cardiac: []  chest pain/pressure [x]  dyspnea on exertion  Vascular: []  pain in legs while walking []  non-healing ulcers []  hx of DVT []  swelling in legs  Pulmonary: []  asthma/wheezing []  home O2  Neurologic: []  hx of CVA []  mini stroke   Hematologic: []  hx of cancer  Endocrine:   [x]  diabetes []  thyroid disease  GI []  GERD  GU: [x]  CKD/renal failure []  HD--[]  M/W/F or []  T/T/S  Psychiatric: []  anxiety []  depression  Musculoskeletal: []  arthritis []  joint pain  Integumentary: []  rashes []  ulcers  Constitutional: []  fever  []  chills   Physical Examination  Vitals:   12/20/21 1000 12/20/21 1245  BP: 132/67 (!) 147/73  Pulse: 75 79  Resp: (!) 22 15  Temp:    SpO2: 100% 100%   Body mass index is 24.98 kg/m.  General:  WDWN in NAD Gait: Not observed HENT: WNL, normocephalic Pulmonary: normal non-labored breathing Cardiac: regular, without carotid bruits Skin: without rashes Vascular Exam/Pulses: Easily palpable bilateral radial and DP pulses. Extremities: left arm fistula with excellent thrill.  Her motor and sensation are in tact.  Musculoskeletal: no muscle wasting or atrophy  Neurologic: A&O X 3; speech is fluent/normal Psychiatric:  The pt has Normal affect.   CBC    Component Value Date/Time   WBC 4.6 12/19/2021 1358   RBC 2.67 (L) 12/19/2021 1358   HGB 7.1 (L) 12/19/2021 1444   HCT 21.0 (L) 12/19/2021 1444   PLT 207 12/19/2021 1358   MCV 87.3 12/19/2021 1358   MCH 28.8 12/19/2021 1358   MCHC 33.0 12/19/2021 1358   RDW 15.6 (H) 12/19/2021 1358   LYMPHSABS 0.8 12/19/2021 1358   MONOABS 0.3 12/19/2021 1358   EOSABS 0.1 12/19/2021 1358   BASOSABS 0.0 12/19/2021 1358    BMET    Component Value  Date/Time   NA 141 12/20/2021 0650   K 4.8 12/20/2021 0650   CL 115 (H) 12/20/2021 0650   CO2 14 (L) 12/20/2021 0650   GLUCOSE 89 12/20/2021 0650   BUN 73 (H) 12/20/2021 0650   CREATININE 6.05 (H) 12/20/2021 0650   CALCIUM 8.6 (L) 12/20/2021 0650   GFRNONAA 7 (L) 12/20/2021 0650   GFRAA 57 (L) 09/09/2015 1319    COAGS: Lab Results  Component Value Date   INR 1.1 04/13/2021  Non-Invasive Vascular Imaging:   BUE Vein Mapping on 08/12/2021: +------------+----------+-------------+----------+--------+   OUTFLOW VEIN PSV (cm/s) Diameter (cm) Depth (cm) Describe   +------------+----------+-------------+----------+--------+   Mid UA           91         0.48         0.75               +------------+----------+-------------+----------+--------+   Dist UA         212         0.53         0.74               +------------+----------+-------------+----------+--------+   AC Fossa        951         0.19         0.37               +------------+----------+-------------+----------+--------+    ASSESSMENT/PLAN: This is a 76 y.o. female with advanced CKD now with uremic symptoms and is in need of 2nd stage BVT and TDC placement.  -pt now in need of HD and will require 2nd stage BVT and TDC placement.  I discussed with her that the 2nd stage is more involved than the first stage requiring 2-3 incisions on the left upper arm as well as soreness where the vein will be tunneled.  Discussed the possibility of post operative numbness due to nerve irritation.   I also discussed with her that dialysis access does not typically last forever and will eventually need intervention or even new access in the future.  I discussed with her that we do not know how long the access will last.  She expressed understanding and is willing to proceed.   -Dr. Scot Dock will be by to see pt this afternoon.    Leontine Locket, PA-C [facility] [phone]

## 2021-12-20 NOTE — Progress Notes (Signed)
PROGRESS NOTE    Jackie Wade  LZJ:[id number] DOB: Jan 24, 1946 DOA: 12/19/2021 PCP: Lucianne Lei, MD    Brief Narrative:  76 year old female from home with history of type 2 diabetes, hypertension, CKD stage V with fistula in place, anemia of chronic kidney disease presented with about a week of shortness of breath and exertional dyspnea, leg swelling.   Assessment & Plan:   Volume overload secondary to CKD stage V, progressing to ESRD: Anemia of chronic disease Essential hypertension  Agree with monitoring.  Diuretic trial. Probably heading to hemodialysis.  To be seen by nephrology today. She has make sure left upper extremity AV fistula. Will benefit with IV iron.  Will defer to nephrology. Blood pressure stable on home medications.  Type 2 diabetes: Patient on insulin and Januvia at home.  Continued.    DVT prophylaxis: heparin injection 5,000 Units Start: 12/19/21 2200   Code Status: Full code Family Communication: Friend at the bedside Disposition Plan: Status is: Inpatient  Remains inpatient appropriate because: Probable inpatient hemodialysis need.         Consultants:  Nephrology  Procedures:  None  Antimicrobials:  None   Subjective: Patient seen and examined.  He still in the emergency room.  Her good friend is at the bedside.  Overnight she had some chest discomfort and pain, relieved with nitro patch.  Patient stated urinating 4 times last night, output was not recorded.  Patient feels her breathing is better and leg swelling has improved. Currently denies any chest pain, nausea vomiting.  Objective: Vitals:   12/20/21 0623 12/20/21 0700 12/20/21 0730 12/20/21 0749  BP: (!) 148/75 133/76 (!) 157/73 (!) 157/69  Pulse: 80 79 81 82  Resp: (!) 25 18 (!) 28   Temp: 98 F (36.7 C)     TempSrc: Oral     SpO2: 99% 99% 99%   Weight:      Height:       No intake or output data in the 24 hours ending 12/20/21 0835 Filed Weights   12/19/21  1830  Weight: 64 kg    Examination:  General exam: Appears calm and comfortable  On 1 L oxygen.  Not in any distress. Respiratory system: Clear to auscultation. Respiratory effort normal. Cardiovascular system: S1 & S2 heard, RRR.  No edema. Abdominal exam: Nontender. Left upper extremity AV fistula with thrill. No pedal edema.    Data Reviewed: I have personally reviewed following labs and imaging studies  CBC: Recent Labs  Lab 12/19/21 1358 12/19/21 1443 12/19/21 1444  WBC 4.6  --   --   NEUTROABS 3.3  --   --   HGB 7.7* 7.5* 7.1*  HCT 23.3* 22.0* 21.0*  MCV 87.3  --   --   PLT 207  --   --    Basic Metabolic Panel: Recent Labs  Lab 12/19/21 1358 12/19/21 1443 12/19/21 1444 12/20/21 0650  NA 140 140 140 141  K 4.1 4.1 4.3 4.8  CL 114* 113*  --  115*  CO2 17*  --   --  14*  GLUCOSE 140* 136*  --  89  BUN 78* 89*  --  73*  CREATININE 5.99* 6.50*  --  6.05*  CALCIUM 8.6*  --   --  8.6*   GFR: Estimated Creatinine Clearance: 7.5 mL/min (A) (by C-G formula based on SCr of 6.05 mg/dL (H)). Liver Function Tests: No results for input(s): AST, ALT, ALKPHOS, BILITOT, PROT, ALBUMIN in the last 168 hours. No results  for input(s): LIPASE, AMYLASE in the last 168 hours. No results for input(s): AMMONIA in the last 168 hours. Coagulation Profile: No results for input(s): INR, PROTIME in the last 168 hours. Cardiac Enzymes: No results for input(s): CKTOTAL, CKMB, CKMBINDEX, TROPONINI in the last 168 hours. BNP (last 3 results) No results for input(s): PROBNP in the last 8760 hours. HbA1C: No results for input(s): HGBA1C in the last 72 hours. CBG: Recent Labs  Lab 12/19/21 2251 12/20/21 0742  GLUCAP 140* 92   Lipid Profile: No results for input(s): CHOL, HDL, LDLCALC, TRIG, CHOLHDL, LDLDIRECT in the last 72 hours. Thyroid Function Tests: No results for input(s): TSH, T4TOTAL, FREET4, T3FREE, THYROIDAB in the last 72 hours. Anemia Panel: No results for  input(s): VITAMINB12, FOLATE, FERRITIN, TIBC, IRON, RETICCTPCT in the last 72 hours. Sepsis Labs: No results for input(s): PROCALCITON, LATICACIDVEN in the last 168 hours.  Recent Results (from the past 240 hour(s))  Resp Panel by RT-PCR (Flu A&B, Covid) Nasopharyngeal Swab     Status: None   Collection Time: 12/19/21  1:59 PM   Specimen: Nasopharyngeal Swab; Nasopharyngeal(NP) swabs in vial transport medium  Result Value Ref Range Status   SARS Coronavirus 2 by RT PCR NEGATIVE NEGATIVE Final    Comment: (NOTE) SARS-CoV-2 target nucleic acids are NOT DETECTED.  The SARS-CoV-2 RNA is generally detectable in upper respiratory specimens during the acute phase of infection. The lowest concentration of SARS-CoV-2 viral copies this assay can detect is 138 copies/mL. A negative result does not preclude SARS-Cov-2 infection and should not be used as the sole basis for treatment or other patient management decisions. A negative result may occur with  improper specimen collection/handling, submission of specimen other than nasopharyngeal swab, presence of viral mutation(s) within the areas targeted by this assay, and inadequate number of viral copies(<138 copies/mL). A negative result must be combined with clinical observations, patient history, and epidemiological information. The expected result is Negative.  Fact Sheet for Patients:  EntrepreneurPulse.com.au  Fact Sheet for Healthcare Providers:  IncredibleEmployment.be  This test is no t yet approved or cleared by the [place] FDA and  has been authorized for detection and/or diagnosis of SARS-CoV-2 by FDA under an Emergency Use Authorization (EUA). This EUA will remain  in effect (meaning this test can be used) for the duration of the COVID-19 declaration under Section 564(b)(1) of the Act, 21 U.S.C.section 360bbb-3(b)(1), unless the authorization is terminated  or revoked sooner.        Influenza A by PCR NEGATIVE NEGATIVE Final   Influenza B by PCR NEGATIVE NEGATIVE Final    Comment: (NOTE) The Xpert Xpress SARS-CoV-2/FLU/RSV plus assay is intended as an aid in the diagnosis of influenza from Nasopharyngeal swab specimens and should not be used as a sole basis for treatment. Nasal washings and aspirates are unacceptable for Xpert Xpress SARS-CoV-2/FLU/RSV testing.  Fact Sheet for Patients: EntrepreneurPulse.com.au  Fact Sheet for Healthcare Providers: IncredibleEmployment.be  This test is not yet approved or cleared by the [place] FDA and has been authorized for detection and/or diagnosis of SARS-CoV-2 by FDA under an Emergency Use Authorization (EUA). This EUA will remain in effect (meaning this test can be used) for the duration of the COVID-19 declaration under Section 564(b)(1) of the Act, 21 U.S.C. section 360bbb-3(b)(1), unless the authorization is terminated or revoked.  Performed at [facility], [place] [address] [place], [place] [address]          Radiology Studies: DG Chest 2 View  Result Date: 12/19/2021 CLINICAL DATA:  Shortness of breath in a 76 year old female soft. EXAM: CHEST - 2 VIEW COMPARISON:  July 01, 2021. FINDINGS: Cardiomediastinal contours are partially obscured by airspace disease and effusions at the lung bases. Contours grossly stable in unobscured portions compared to previous imaging in this patient with cardiomegaly noted on prior imaging. Obscured LEFT and RIGHT hemidiaphragm. Lungs are otherwise clear with mild increased interstitial prominence. Signs of osteopenia. No acute musculoskeletal finding to the extent evaluated. IMPRESSION: 1. Bilateral effusions and basilar airspace disease likely related to heart failure with basilar volume loss. 2. Increased interstitial markings albeit mild may reflect mild edema. Correlate with evidence of CHF. Electronically Signed   By: Zetta Bills M.D.   On: 12/19/2021 14:35        Scheduled Meds:  carvedilol  6.25 mg Oral BID WC   heparin  5,000 Units Subcutaneous Q8H   insulin aspart  0-9 Units Subcutaneous TID WC   nitroGLYCERIN  1 inch Topical Q6H   rosuvastatin  15 mg Oral Daily   sodium chloride flush  3 mL Intravenous Q12H   Continuous Infusions:  sodium chloride       LOS: 1 day    Time spent: 35 minutes    Barb Merino, MD [facility] Pager [phone]

## 2021-12-20 NOTE — ED Notes (Signed)
Pt noted to be eating butterfinger candy bar. Pt educated on needing to limit sugary foods d/t diabetes. Verbalized understanding.

## 2021-12-21 DIAGNOSIS — E1169 Type 2 diabetes mellitus with other specified complication: Secondary | ICD-10-CM

## 2021-12-21 DIAGNOSIS — Z794 Long term (current) use of insulin: Secondary | ICD-10-CM

## 2021-12-21 DIAGNOSIS — N185 Chronic kidney disease, stage 5: Secondary | ICD-10-CM

## 2021-12-21 DIAGNOSIS — I10 Essential (primary) hypertension: Secondary | ICD-10-CM

## 2021-12-21 DIAGNOSIS — D631 Anemia in chronic kidney disease: Secondary | ICD-10-CM

## 2021-12-21 DIAGNOSIS — E785 Hyperlipidemia, unspecified: Secondary | ICD-10-CM

## 2021-12-21 DIAGNOSIS — E1122 Type 2 diabetes mellitus with diabetic chronic kidney disease: Secondary | ICD-10-CM

## 2021-12-21 LAB — RENAL FUNCTION PANEL
Albumin: 2.6 g/dL — ABNORMAL LOW (ref 3.5–5.0)
Anion gap: 12 (ref 5–15)
BUN: 85 mg/dL — ABNORMAL HIGH (ref 8–23)
CO2: 15 mmol/L — ABNORMAL LOW (ref 22–32)
Calcium: 8.5 mg/dL — ABNORMAL LOW (ref 8.9–10.3)
Chloride: 111 mmol/L (ref 98–111)
Creatinine, Ser: 6.19 mg/dL — ABNORMAL HIGH (ref 0.44–1.00)
GFR, Estimated: 7 mL/min — ABNORMAL LOW (ref >60)
Glucose, Bld: 81 mg/dL (ref 70–99)
Phosphorus: 5.8 mg/dL — ABNORMAL HIGH (ref 2.5–4.6)
Potassium: 4 mmol/L (ref 3.5–5.1)
Sodium: 138 mmol/L (ref 135–145)

## 2021-12-21 LAB — GLUCOSE, CAPILLARY
Glucose-Capillary: 74 mg/dL (ref 70–99)
Glucose-Capillary: 92 mg/dL (ref 70–99)
Glucose-Capillary: 109 mg/dL — ABNORMAL HIGH (ref 70–99)
Glucose-Capillary: 135 mg/dL — ABNORMAL HIGH (ref 70–99)

## 2021-12-21 LAB — FERRITIN: Ferritin: 285 ng/mL (ref 11–307)

## 2021-12-21 LAB — IRON AND TIBC
Iron: 27 ug/dL — ABNORMAL LOW (ref 28–170)
Saturation Ratios: 11 % (ref 10.4–31.8)
TIBC: 246 ug/dL — ABNORMAL LOW (ref 250–450)
UIBC: 219 ug/dL

## 2021-12-21 LAB — PARATHYROID HORMONE, INTACT (NO CA): PTH: 159 pg/mL — ABNORMAL HIGH (ref 15–65)

## 2021-12-21 LAB — HEPATITIS B SURFACE ANTIGEN: Hepatitis B Surface Ag: NONREACTIVE

## 2021-12-21 MED ORDER — FERRIC CITRATE 1 GM 210 MG(FE) PO TABS
210.0000 mg | ORAL_TABLET | Freq: Three times a day (TID) | ORAL | Status: DC
  Administered 2021-12-21 – 2021-12-25 (×12): 210 mg via ORAL
  Filled 2021-12-21 (×12): qty 1

## 2021-12-21 MED ORDER — SODIUM CHLORIDE 0.9 % IV SOLN
1.5000 g | INTRAVENOUS | Status: AC
  Administered 2021-12-22: 1.5 g via INTRAVENOUS
  Filled 2021-12-21: qty 1.5

## 2021-12-21 MED ORDER — CHLORHEXIDINE GLUCONATE CLOTH 2 % EX PADS
6.0000 | MEDICATED_PAD | Freq: Every day | CUTANEOUS | Status: DC
  Administered 2021-12-23 – 2021-12-25 (×3): 6 via TOPICAL

## 2021-12-21 MED ORDER — KIDNEY FAILURE BOOK: Freq: Once | Status: AC

## 2021-12-21 MED ORDER — DARBEPOETIN ALFA 200 MCG/0.4ML IJ SOSY
200.0000 ug | PREFILLED_SYRINGE | Freq: Once | INTRAMUSCULAR | Status: AC
  Administered 2021-12-21: 11:00:00 200 ug via SUBCUTANEOUS
  Filled 2021-12-21: qty 0.4

## 2021-12-21 NOTE — Progress Notes (Signed)
Round with patient today in correlation to transition to outpatient HD. Allowed patient to discuss her frustrations at the need for dialysis and her limited awareness to the impact kidney failure had on her heart. As she reports she had just been made aware. Patient educated at the bedside regarding care of tunneled dialysis catheter, AV fistula site care, assessment of thrill daily and proper medication administration on HD days.  Patient with plan for placement of tunneled HD catheter and second fistula surgery tomorrow. Patient also educated on the importance of adhering to scheduled dialysis treatments, the effects of fluid overload, hyperkalemia and hyperphosphatemia. Patient given handouts and booklet of foods to avoid and limit. Patient capable of re-verbalizing via teach back method. Also educated patient on services available through the interdisciplinary team in the clinic setting. Patient with no further questions at this time. Handouts and contact information provided to patient for any further assistance. Will follow as appropriate.   Dorthey Sawyer, RN  Dialysis Nurse Coordinator Phone: [phone]

## 2021-12-21 NOTE — Progress Notes (Signed)
Subjective:  UOP at least 700-  BUN and crt up -  resting-  trying to prepare for what is to come Objective Vital signs in last 24 hours: Vitals:   12/20/21 1946 12/21/21 0052 12/21/21 0425 12/21/21 0840  BP: 140/85 (!) 142/75 126/69 135/63  Pulse: 76 76 65 78  Resp: (!) 21 20 20 18   Temp: 98.3 F (36.8 C) 98.7 F (37.1 C) 98.5 F (36.9 C) 98.3 F (36.8 C)  TempSrc: Oral Oral Oral Oral  SpO2: 94% 92% 94%   Weight:   59.6 kg   Height:       Weight change: -4.309 kg  Intake/Output Summary (Last 24 hours) at 12/21/2021 0913 Last data filed at 12/21/2021 0425 Gross per 24 hour  Intake 350.49 ml  Output 700 ml  Net -349.51 ml    Assessment/Plan: 76 year old black female with advanced CKD now with symptoms of volume overload and uremia 1.Renal- advanced CKD with uremic symptoms, low albumin, volume overload as well as anemia.  All of these issues giving her symptoms at this time.  She now understands that she will need dialysis in order to feel better.  Agrees to the second stage of her BVT and tunneled dialysis catheter placement as well as initiation of dialysis-  appreciate VVS-  this is planned for tomorrow followed by her first HD treatment.  Patient would like to do third shift at [facility] if possible as an OP -have informed renal navigator and renal educator of patient's presence in the hospital.  Once outpatient spot has been established she would be able to be discharged.  Do not anticipate a long hospitalization here-maybe after just 1 -2 HD treatment 2. Hypertension/volume  -is overloaded and having symptoms-did respond to Lasix dosing.  We will continue it every 12 hours until we can establish dialysis 3. Anemia  -this is significant and likely contributing to her symptoms.  I will give ESA and  iron stores are low, will replete-trying to avoid transfusion as patient might be candidate for kidney transplant in the near future 4. Bones- PTH pending -  phos is high-  will start binder-   Kristen Loader    Labs: Basic Metabolic Panel: Recent Labs  Lab 12/19/21 1358 12/19/21 1443 12/19/21 1444 12/20/21 0650 12/21/21 0405  NA 140 140 140 141 138  K 4.1 4.1 4.3 4.8 4.0  CL 114* 113*  --  115* 111  CO2 17*  --   --  14* 15*  GLUCOSE 140* 136*  --  89 81  BUN 78* 89*  --  73* 85*  CREATININE 5.99* 6.50*  --  6.05* 6.19*  CALCIUM 8.6*  --   --  8.6* 8.5*  PHOS  --   --   --   --  5.8*   Liver Function Tests: Recent Labs  Lab 12/21/21 0405  ALBUMIN 2.6*   No results for input(s): LIPASE, AMYLASE in the last 168 hours. No results for input(s): AMMONIA in the last 168 hours. CBC: Recent Labs  Lab 12/19/21 1358 12/19/21 1443 12/19/21 1444  WBC 4.6  --   --   NEUTROABS 3.3  --   --   HGB 7.7* 7.5* 7.1*  HCT 23.3* 22.0* 21.0*  MCV 87.3  --   --   PLT 207  --   --    Cardiac Enzymes: No results for input(s): CKTOTAL, CKMB, CKMBINDEX, TROPONINI in the last 168 hours. CBG: Recent  Labs  Lab 12/19/21 2251 12/20/21 0742 12/20/21 1217 12/20/21 1625 12/21/21 0612  GLUCAP 140* 92 119* 89 74    Iron Studies:  Recent Labs    12/21/21 0405  IRON 27*  TIBC 246*  FERRITIN 285   Studies/Results: DG Chest 2 View  Result Date: 12/19/2021 CLINICAL DATA:  Shortness of breath in a 76 year old female soft. EXAM: CHEST - 2 VIEW COMPARISON:  July 01, 2021. FINDINGS: Cardiomediastinal contours are partially obscured by airspace disease and effusions at the lung bases. Contours grossly stable in unobscured portions compared to previous imaging in this patient with cardiomegaly noted on prior imaging. Obscured LEFT and RIGHT hemidiaphragm. Lungs are otherwise clear with mild increased interstitial prominence. Signs of osteopenia. No acute musculoskeletal finding to the extent evaluated. IMPRESSION: 1. Bilateral effusions and basilar airspace disease likely related to heart failure with basilar volume loss. 2. Increased interstitial markings  albeit mild may reflect mild edema. Correlate with evidence of CHF. Electronically Signed   By: Zetta Bills M.D.   On: 12/19/2021 14:35   ECHOCARDIOGRAM COMPLETE  Result Date: 12/20/2021    ECHOCARDIOGRAM REPORT   Patient Name:   Jackie Wade Date of Exam: 12/20/2021 Medical Rec #:  [id number]       Height:       63.0 in Accession #:    [id number]      Weight:       141.0 lb Date of Birth:  August 21, 1946       BSA:          1.667 m Patient Age:    12 years        BP:           132/67 mmHg Patient Gender: F               HR:           77 bpm. Exam Location:  Inpatient Procedure: 2D Echo Indications:    CHF  History:        Patient has no prior history of Echocardiogram examinations.                 Risk Factors:Hypertension and Diabetes.  Sonographer:    Jefferey Pica Referring Phys: [place]  1. Left ventricular ejection fraction, by estimation, is 40 to 45%. The left ventricle has mildly decreased function. The left ventricle demonstrates global hypokinesis. There is severe concentric left ventricular hypertrophy. Left ventricular diastolic  parameters are consistent with Grade II diastolic dysfunction (pseudonormalization).  2. Right ventricular systolic function is normal. The right ventricular size is normal. Mildly increased right ventricular wall thickness. There is moderately elevated pulmonary artery systolic pressure. The estimated right ventricular systolic pressure  is 54.6 mmHg.  3. Left atrial size was moderately dilated.  4. A small pericardial effusion is present. The pericardial effusion is circumferential. There is no evidence of cardiac tamponade. Large pleural effusion in the left lateral region.  5. Mild to moderate mitral valve regurgitation and eccentric.  6. Tricuspid valve regurgitation is mild to moderate.  7. The aortic valve is tricuspid. Aortic valve regurgitation is not visualized.  8. The inferior vena cava is dilated in size with <50% respiratory  variability, suggesting right atrial pressure of 15 mmHg. Comparison(s): No prior Echocardiogram. Conclusion(s)/Recommendation(s): Notable biventricular hypertrophy, atrial enlargement, and pericardial effusion. Outpatient pyrophosphate imaging for amyloidosis may be reasonable if clinically indicated. FINDINGS  Left Ventricle: Left ventricular ejection fraction, by estimation, is 40 to 45%. The  left ventricle has mildly decreased function. The left ventricle demonstrates global hypokinesis. The left ventricular internal cavity size was normal in size. There is  severe concentric left ventricular hypertrophy. Left ventricular diastolic parameters are consistent with Grade II diastolic dysfunction (pseudonormalization). Right Ventricle: The right ventricular size is normal. Mildly increased right ventricular wall thickness. Right ventricular systolic function is normal. There is moderately elevated pulmonary artery systolic pressure. The tricuspid regurgitant velocity is 2.90 m/s, and with an assumed right atrial pressure of 15 mmHg, the estimated right ventricular systolic pressure is 67.6 mmHg. Left Atrium: Left atrial size was moderately dilated. Right Atrium: Right atrial size was normal in size. Pericardium: A small pericardial effusion is present. The pericardial effusion is circumferential. There is no evidence of cardiac tamponade. Mitral Valve: The mitral valve is normal in structure. Mild to moderate mitral valve regurgitation. Tricuspid Valve: The tricuspid valve is normal in structure. Tricuspid valve regurgitation is mild to moderate. No evidence of tricuspid stenosis. Aortic Valve: The aortic valve is tricuspid. Aortic valve regurgitation is not visualized. Aortic valve peak gradient measures 9.1 mmHg. Pulmonic Valve: The pulmonic valve was normal in structure. Pulmonic valve regurgitation is mild. No evidence of pulmonic stenosis. Aorta: The aortic root and ascending aorta are structurally normal, with  no evidence of dilitation. Venous: The inferior vena cava is dilated in size with less than 50% respiratory variability, suggesting right atrial pressure of 15 mmHg. IAS/Shunts: The interatrial septum was not well visualized. Additional Comments: There is a large pleural effusion in the left lateral region.  LEFT VENTRICLE PLAX 2D LVIDd:         5.40 cm      Diastology LVIDs:         4.00 cm      LV e' medial:    3.57 cm/s LV PW:         1.50 cm      LV E/e' medial:  31.1 LV IVS:        1.60 cm      LV e' lateral:   5.15 cm/s LVOT diam:     2.00 cm      LV E/e' lateral: 21.6 LV SV:         83 LV SV Index:   50 LVOT Area:     3.14 cm  LV Volumes (MOD) LV vol d, MOD A4C: 117.0 ml LV vol s, MOD A4C: 74.6 ml LV SV MOD A4C:     117.0 ml RIGHT VENTRICLE             IVC RV Basal diam:  2.80 cm     IVC diam: 2.60 cm RV S prime:     11.50 cm/s TAPSE (M-mode): 2.3 cm LEFT ATRIUM             Index        RIGHT ATRIUM           Index LA diam:        4.10 cm 2.46 cm/m   RA Area:     16.90 cm LA Vol (A2C):   82.4 ml 49.44 ml/m  RA Volume:   48.10 ml  28.86 ml/m LA Vol (A4C):   64.2 ml 38.52 ml/m LA Biplane Vol: 76.6 ml 45.96 ml/m  AORTIC VALVE                 PULMONIC VALVE AV Area (Vmax): 2.43 cm     PV Vmax:  0.78 m/s AV Vmax:        151.00 cm/s  PV Peak grad:  2.5 mmHg AV Peak Grad:   9.1 mmHg LVOT Vmax:      117.00 cm/s LVOT Vmean:     76.100 cm/s LVOT VTI:       0.265 m  AORTA Ao Root diam: 3.30 cm Ao Asc diam:  3.20 cm MITRAL VALVE                TRICUSPID VALVE MV Area (PHT): 4.26 cm     TR Peak grad:   33.6 mmHg MV Decel Time: 178 msec     TR Vmax:        290.00 cm/s MV E velocity: 111.00 cm/s MV A velocity: 123.00 cm/s  SHUNTS MV E/A ratio:  0.90         Systemic VTI:  0.26 m                             Systemic Diam: 2.00 cm Rudean Haskell MD Electronically signed by Rudean Haskell MD Signature Date/Time: 12/20/2021/1:22:08 PM    Final    Medications: Infusions:  sodium chloride     [START  ON 12/22/2021] cefUROXime (ZINACEF)  IV     furosemide 160 mg (12/21/21 0029)    Scheduled Medications:  carvedilol  6.25 mg Oral BID WC   darbepoetin (ARANESP) injection - NON-DIALYSIS  200 mcg Subcutaneous Q Tue-1800   darbepoetin (ARANESP) injection - NON-DIALYSIS  200 mcg Subcutaneous Once   heparin  5,000 Units Subcutaneous Q8H   insulin aspart  0-9 Units Subcutaneous TID WC   nitroGLYCERIN  1 inch Topical Q6H   rosuvastatin  15 mg Oral Daily   sodium chloride flush  3 mL Intravenous Q12H    have reviewed scheduled and prn medications.  Physical Exam: General:  NAD Heart: RRR Lungs: mostly clear Abdomen: soft, non tender Extremities: no edema Dialysis Access: left AVF-  positive thrill     12/21/2021,9:13 AM  LOS: 2 days

## 2021-12-21 NOTE — Progress Notes (Signed)
Hep B results faxed to [facility] admissions along with today's progress notes. Will follow and assist.   Melven Sartorius Renal Navigator [phone]

## 2021-12-21 NOTE — Assessment & Plan Note (Signed)
Continue with statin therapy.  ?

## 2021-12-21 NOTE — Assessment & Plan Note (Addendum)
Patient is tolerating po well, plan to continue with insulin sliding scale for glucose cover and monitoring  Fasting glucose today 90  Continue with statin therapy

## 2021-12-21 NOTE — Progress Notes (Signed)
Heart Failure Navigator Progress Note  Assessed for Heart & Vascular TOC clinic readiness.  Patient does not meet criteria due to initiating HD treatments this hospitalization. SCr >6  Navigator available for educational resources.   Pricilla Holm, MSN, RN Heart Failure Nurse Navigator [phone]

## 2021-12-21 NOTE — TOC Progression Note (Signed)
Transition of Care [facility]) - Progression Note    Patient Details  Name: Jackie Wade MRN: [id number] Date of Birth: Sep 01, 1946  Transition of Care [facility]) CM/SW Contact  Zenon Mayo, RN Phone Number: 12/21/2021, 12:00 PM  Clinical Narrative:     Transition of Care [facility]) Screening Note   Patient Details  Name: Jackie Wade Date of Birth: 04-11-1946   Transition of Care [facility]) CM/SW Contact:    Zenon Mayo, RN Phone Number: 12/21/2021, 12:00 PM    Transition of Care Department [facility]) has reviewed patient and no TOC needs have been identified at this time. We will continue to monitor patient advancement through interdisciplinary progression rounds. If new patient transition needs arise, please place a TOC consult.          Expected Discharge Plan and Services                                                 Social Determinants of Health (SDOH) Interventions    Readmission Risk Interventions No flowsheet data found.

## 2021-12-21 NOTE — Hospital Course (Addendum)
Jackie Wade was admitted to the hospital with the working diagnosis of volume overload in the setting of CKD stage 5 progression to ESRD.  76 yo female with the past medical history of CKD stage 5 (1st stage BVT 06/2021), HTN, T2DM, and chronic anemia who presented with dyspnea. Reported worsening dyspnea for the last 7 to 10 days associated with dyspnea. On her initial physical examination her blood pressure was 173/80, HR 88, RR 22 and oxygen saturation of 98%, ill looking appearing, with decreased breath sounds bilaterally, heart with S1 and S2 present with no gallops or rubs, abdomen soft and positive 2+ lower extremity edema.   Na 140, K 4,1, CL 114, bicarb 17, glucose 140, BUN 778 and cr 5,99 BNP 1,6073 Wbc 4,6. Hgb 7,7, hct 23, 3 and plt 207 SARS COVID 19 negative  Urine analysis with sg 1,008.    Chest radiograph with bilateral small pleural effusions, bilateral interstitial infiltrates.   EKG with 85 bpm, normal axis, normal intervals, sinus rhythm, with no significant ST segment or T wave changes.   Patient was placed on diuretic therapy for volume overload.  Nephrology was consulted with recommendations to initiate renal replacement therapy. Vascular consulted for 2nd stage BVT and tunneled HD catheter placement.  01/26 patient had temporary HD cathter placed on right IJ vein and second stage brachio basilic arteriovenous fistula creation.   Tolerated renal replacement therapy well.   01/26 Bleeding at the left upper extremity vascular access site. Developed hematoma that required incision and drainage of left arm brachiocephalic arteriovenous fistula.   Developed acute blood loss anemia requiring PRBC transfusion.  01/28 hemodialysis #2  Plan to discharge home on 01/29 if continue to be stable.

## 2021-12-21 NOTE — Progress Notes (Addendum)
Pt for 2nd stage left BVT and insertion of [facility] tomorrow with Dr. Stanford Breed.  Pre op orders have been done.  NPO after MN/labs/consent.   Leontine Locket, [facility] 12/21/2021 6:48 AM  I have interviewed the patient and examined the patient. I agree with the findings by the PA.  Gae Gallop, MD

## 2021-12-21 NOTE — Plan of Care (Signed)
  Problem: Education: Goal: Knowledge of General Education information will improve Description Including pain rating scale, medication(s)/side effects and non-pharmacologic comfort measures Outcome: Progressing   Problem: Clinical Measurements: Goal: Will remain free from infection Outcome: Progressing Goal: Diagnostic test results will improve Outcome: Progressing Goal: Respiratory complications will improve Outcome: Progressing Goal: Cardiovascular complication will be avoided Outcome: Progressing   Problem: Activity: Goal: Risk for activity intolerance will decrease Outcome: Progressing   Problem: Nutrition: Goal: Adequate nutrition will be maintained Outcome: Progressing   Problem: Elimination: Goal: Will not experience complications related to bowel motility Outcome: Progressing Goal: Will not experience complications related to urinary retention Outcome: Progressing   Problem: Pain Managment: Goal: General experience of comfort will improve Outcome: Progressing   Problem: Safety: Goal: Ability to remain free from injury will improve Outcome: Progressing   Problem: Skin Integrity: Goal: Risk for impaired skin integrity will decrease Outcome: Progressing   Problem: Activity: Goal: Capacity to carry out activities will improve Outcome: Progressing   Problem: Cardiac: Goal: Ability to achieve and maintain adequate cardiopulmonary perfusion will improve Outcome: Progressing   

## 2021-12-21 NOTE — Progress Notes (Signed)
Progress Note   Patient: Jackie Wade DUK:[id number] DOB: [birth year] DOA: 12/19/2021     2 DOS: the patient was seen and examined on 12/21/2021   Brief hospital course: Jackie Wade was admitted to the hospital with the working diagnosis of volume overload in the setting of CKD stage 5 progression to ESRD.  76 yo female with the past medical history of CKD stage 5 (1st stage BVT 06/2021), HTN, T2DM, and chronic anemia who presented with dyspnea. Reported worsening dyspnea for the last 7 to 10 days associated with dyspnea. On her initial physical examination her blood pressure was 173/80, HR 88, RR 22 and oxygen saturation of 98%, ill looking appearing, with decreased breath sounds bilaterally, heart with S1 and S2 present with no gallops or rubs, abdomen soft and positive 2+ lower extremity edema.   Na 140, K 4,1, CL 114, bicarb 17, glucose 140, BUN 778 and cr 5,99 BNP 3,7628 Wbc 4,6. Hgb 7,7, hct 23, 3 and plt 207 SARS COVID 19 negative  Urine analysis with sg 1,008.    Chest radiograph with bilateral small pleural effusions, bilateral interstitial infiltrates.   EKG with 85 bpm, normal axis, normal intervals, sinus rhythm, with no significant ST segment or T wave changes.   Patient was placed on diuretic therapy for volume overload.  Nephrology was consulted with recommendations to initiate renal replacement therapy. Vascular consulted for 2nd stage BVT and tunneled HD catheter placement.  Assessment and Plan * CKD (chronic kidney disease) stage 5, GFR less than 15 ml/min (HCC)- (present on admission) Patient with improvement in her symptoms but not back to baseline, she continue to have rales on auscultation.  Documented urine output is 700 cc  Non anion gap metabolic acidosis.  K is 4,0 and bicarbonate 15 with BUN 85 and phosphorus 5,8.   Continue diuresis with furosemide 160 mg IV q12 Plan to start renal replacement therapy in am.  Continue phosphate binders.  Follow renal  function and electrolytes in am.   New onset of congestive heart failure ([place]) Hypervolemia mainly due to renal failure. Echocardiogram with preserved LV systolic function with EF 40 to 40% with global hypokinesis, RV systolic function preserved. Small pericardial effusion. Mild to moderate mitral valve regurgitation, tricuspid valve with moderate regurgitation.   Continue diuresis and blood pressure control Plan for ultrafiltration in am.   Essential hypertension- (present on admission) Continue blood pressure control with carvedilol Diuresis with furosemide and plan for ultrafiltration in am.   Anemia due to chronic kidney disease- (present on admission) Positive iron deficiency combined with anemia or chronic renal disease. Iron panel with iron 27, TIBC 246, transferrin saturation 11 and ferritin 285.  Continue with darbapoetin.   DM2 (diabetes mellitus, type 2) ([place]) Continue glucose cover and monitoring with insulin sliding scale, Patient is tolerating po well.   Hyperlipidemia due to type 2 diabetes mellitus ([place])- (present on admission) Continue with statin therapy      Subjective: Patient is feeling better, but not back to baseline, no nausea or vomiting, no chest pain.   Objective BP (!) 144/69 (BP Location: Right Arm)    Pulse 77    Temp 97.7 F (36.5 C) (Oral)    Resp 18    Ht 5\' 3"  (1.6 m)    Wt 59.6 kg Comment: scale a   SpO2 93%    BMI 23.29 kg/m   Neurology patient is awake and alert ENT mild pallor but not icterus Cardiovascular with S1 and S2 present, with no  gallops or murmurs Pulmonary Patient with rales bilaterally with no wheezing, or rhonchi.  Abdomen is soft and non tender Skin with no rashes Musculoskeletal with no deformities.   Data Reviewed:  As above  Family Communication: no family at the bedside   Disposition: Status is: Inpatient  Remains inpatient appropriate because: renal replacement therapy          Author: Tawni Millers, MD 12/21/2021 3:17 PM  For on call review [email or web address].

## 2021-12-22 ENCOUNTER — Inpatient Hospital Stay (HOSPITAL_COMMUNITY): Payer: Medicare Other

## 2021-12-22 ENCOUNTER — Other Ambulatory Visit: Payer: Self-pay

## 2021-12-22 ENCOUNTER — Encounter (HOSPITAL_COMMUNITY)
Admission: EM | Disposition: A | Discharge status: Home Health | Payer: Self-pay | Source: Home / Self Care | Attending: Internal Medicine

## 2021-12-22 ENCOUNTER — Inpatient Hospital Stay (HOSPITAL_COMMUNITY): Payer: Medicare Other | Admitting: Certified Registered Nurse Anesthetist

## 2021-12-22 ENCOUNTER — Inpatient Hospital Stay (HOSPITAL_COMMUNITY): Payer: Medicare Other | Admitting: Certified Registered"

## 2021-12-22 ENCOUNTER — Encounter (HOSPITAL_COMMUNITY): Payer: Self-pay | Admitting: Internal Medicine

## 2021-12-22 DIAGNOSIS — I97638 Postprocedural hematoma of a circulatory system organ or structure following other circulatory system procedure: Secondary | ICD-10-CM

## 2021-12-22 DIAGNOSIS — N186 End stage renal disease: Secondary | ICD-10-CM

## 2021-12-22 HISTORY — PX: I & D EXTREMITY: SHX5045

## 2021-12-22 HISTORY — PX: INSERTION OF DIALYSIS CATHETER: SHX1324

## 2021-12-22 HISTORY — PX: BASCILIC VEIN TRANSPOSITION: SHX5742

## 2021-12-22 LAB — BASIC METABOLIC PANEL
Anion gap: 11 (ref 5–15)
BUN: 90 mg/dL — ABNORMAL HIGH (ref 8–23)
CO2: 18 mmol/L — ABNORMAL LOW (ref 22–32)
Calcium: 8.3 mg/dL — ABNORMAL LOW (ref 8.9–10.3)
Chloride: 109 mmol/L (ref 98–111)
Creatinine, Ser: 6.33 mg/dL — ABNORMAL HIGH (ref 0.44–1.00)
GFR, Estimated: 6 mL/min — ABNORMAL LOW (ref >60)
Glucose, Bld: 120 mg/dL — ABNORMAL HIGH (ref 70–99)
Potassium: 3.9 mmol/L (ref 3.5–5.1)
Sodium: 138 mmol/L (ref 135–145)

## 2021-12-22 LAB — CBC
HCT: 19.3 % — ABNORMAL LOW (ref 36.0–46.0)
Hemoglobin: 6.6 g/dL — CL (ref 12.0–15.0)
MCH: 29.1 pg (ref 26.0–34.0)
MCHC: 34.2 g/dL (ref 30.0–36.0)
MCV: 85 fL (ref 80.0–100.0)
Platelets: 183 10*3/uL (ref 150–400)
RBC: 2.27 MIL/uL — ABNORMAL LOW (ref 3.87–5.11)
RDW: 15.6 % — ABNORMAL HIGH (ref 11.5–15.5)
WBC: 3.6 10*3/uL — ABNORMAL LOW (ref 4.0–10.5)
nRBC: 0 % (ref 0.0–0.2)

## 2021-12-22 LAB — GLUCOSE, CAPILLARY
Glucose-Capillary: 99 mg/dL (ref 70–99)
Glucose-Capillary: 89 mg/dL (ref 70–99)
Glucose-Capillary: 89 mg/dL (ref 70–99)
Glucose-Capillary: 80 mg/dL (ref 70–99)
Glucose-Capillary: 70 mg/dL (ref 70–99)

## 2021-12-22 LAB — PREPARE RBC (CROSSMATCH)

## 2021-12-22 LAB — HEMOGLOBIN AND HEMATOCRIT, BLOOD
HCT: 20.3 % — ABNORMAL LOW (ref 36.0–46.0)
Hemoglobin: 7.1 g/dL — ABNORMAL LOW (ref 12.0–15.0)

## 2021-12-22 SURGERY — TRANSPOSITION, VEIN, BASILIC
Anesthesia: General | Laterality: Right

## 2021-12-22 SURGERY — IRRIGATION AND DEBRIDEMENT EXTREMITY
Anesthesia: General | Site: Arm Upper | Laterality: Left

## 2021-12-22 MED ORDER — LIDOCAINE 2% (20 MG/ML) 5 ML SYRINGE
INTRAMUSCULAR | Status: AC
  Filled 2021-12-22: qty 5

## 2021-12-22 MED ORDER — DIPHENHYDRAMINE HCL 50 MG/ML IJ SOLN
25.0000 mg | Freq: Once | INTRAMUSCULAR | Status: AC
  Administered 2021-12-22: 25 mg via INTRAVENOUS
  Filled 2021-12-22: qty 1

## 2021-12-22 MED ORDER — SODIUM CHLORIDE 0.9 % IV SOLN: INTRAVENOUS | Status: DC | PRN

## 2021-12-22 MED ORDER — HYDROMORPHONE HCL 1 MG/ML IJ SOLN: 0.5000 mg | INTRAMUSCULAR | Status: DC | PRN

## 2021-12-22 MED ORDER — FENTANYL CITRATE (PF) 100 MCG/2ML IJ SOLN: 25.0000 ug | INTRAMUSCULAR | Status: DC | PRN

## 2021-12-22 MED ORDER — HYDROCODONE-ACETAMINOPHEN 5-325 MG PO TABS
1.0000 | ORAL_TABLET | ORAL | Status: DC | PRN
  Administered 2021-12-22: 1 via ORAL
  Filled 2021-12-22: qty 1

## 2021-12-22 MED ORDER — 0.9 % SODIUM CHLORIDE (POUR BTL) OPTIME
TOPICAL | Status: DC | PRN
  Administered 2021-12-22: 1000 mL

## 2021-12-22 MED ORDER — LIDOCAINE HCL (CARDIAC) PF 100 MG/5ML IV SOSY
PREFILLED_SYRINGE | INTRAVENOUS | Status: DC | PRN
  Administered 2021-12-22: 80 mg via INTRAVENOUS

## 2021-12-22 MED ORDER — PROPOFOL 10 MG/ML IV BOLUS
INTRAVENOUS | Status: AC
  Filled 2021-12-22: qty 20

## 2021-12-22 MED ORDER — CEFAZOLIN SODIUM 1 G IJ SOLR
INTRAMUSCULAR | Status: AC
  Filled 2021-12-22: qty 20

## 2021-12-22 MED ORDER — PHENYLEPHRINE HCL (PRESSORS) 10 MG/ML IV SOLN
INTRAVENOUS | Status: DC | PRN
  Administered 2021-12-22: 80 ug via INTRAVENOUS
  Administered 2021-12-22: 120 ug via INTRAVENOUS
  Administered 2021-12-22: 80 ug via INTRAVENOUS

## 2021-12-22 MED ORDER — HEPARIN 6000 UNIT IRRIGATION SOLUTION
Status: AC
  Filled 2021-12-22: qty 500

## 2021-12-22 MED ORDER — MIDAZOLAM HCL 2 MG/2ML IJ SOLN
INTRAMUSCULAR | Status: AC
  Filled 2021-12-22: qty 2

## 2021-12-22 MED ORDER — OXYCODONE HCL 5 MG PO TABS: 5.0000 mg | ORAL_TABLET | ORAL | Status: DC | PRN

## 2021-12-22 MED ORDER — FENTANYL CITRATE (PF) 100 MCG/2ML IJ SOLN
INTRAMUSCULAR | Status: AC
  Filled 2021-12-22: qty 2

## 2021-12-22 MED ORDER — FENTANYL CITRATE (PF) 100 MCG/2ML IJ SOLN
25.0000 ug | INTRAMUSCULAR | Status: DC | PRN
  Administered 2021-12-22: 50 ug via INTRAVENOUS

## 2021-12-22 MED ORDER — ACETAMINOPHEN 325 MG PO TABS
650.0000 mg | ORAL_TABLET | Freq: Four times a day (QID) | ORAL | Status: DC
  Administered 2021-12-23 – 2021-12-25 (×8): 650 mg via ORAL
  Filled 2021-12-22 (×10): qty 2

## 2021-12-22 MED ORDER — OXYCODONE HCL 5 MG/5ML PO SOLN: 5.0000 mg | Freq: Once | ORAL | Status: DC | PRN

## 2021-12-22 MED ORDER — ONDANSETRON HCL 4 MG/2ML IJ SOLN
INTRAMUSCULAR | Status: AC
  Filled 2021-12-22: qty 2

## 2021-12-22 MED ORDER — DEXAMETHASONE SODIUM PHOSPHATE 10 MG/ML IJ SOLN
INTRAMUSCULAR | Status: DC | PRN
  Administered 2021-12-22: 4 mg via INTRAVENOUS

## 2021-12-22 MED ORDER — ALBUMIN HUMAN 5 % IV SOLN: INTRAVENOUS | Status: DC | PRN

## 2021-12-22 MED ORDER — CHLORHEXIDINE GLUCONATE 0.12 % MT SOLN
OROMUCOSAL | Status: AC
  Administered 2021-12-22: 15 mL via OROMUCOSAL
  Filled 2021-12-22: qty 15

## 2021-12-22 MED ORDER — ACETAMINOPHEN 325 MG PO TABS
650.0000 mg | ORAL_TABLET | Freq: Once | ORAL | Status: DC
  Filled 2021-12-22: qty 2

## 2021-12-22 MED ORDER — FENTANYL CITRATE (PF) 100 MCG/2ML IJ SOLN
INTRAMUSCULAR | Status: DC | PRN
  Administered 2021-12-22 (×2): 50 ug via INTRAVENOUS

## 2021-12-22 MED ORDER — CEFAZOLIN SODIUM-DEXTROSE 2-3 GM-%(50ML) IV SOLR
INTRAVENOUS | Status: DC | PRN
  Administered 2021-12-22: 2 g via INTRAVENOUS

## 2021-12-22 MED ORDER — HEPARIN 6000 UNIT IRRIGATION SOLUTION
Status: DC | PRN
  Administered 2021-12-22: 1

## 2021-12-22 MED ORDER — ANTICOAGULANT SODIUM CITRATE 4% (200MG/5ML) IV SOLN
5.0000 mL | Freq: Once | Status: DC
  Filled 2021-12-22: qty 5

## 2021-12-22 MED ORDER — CHLORHEXIDINE GLUCONATE 0.12 % MT SOLN: 15.0000 mL | Freq: Once | OROMUCOSAL | Status: AC

## 2021-12-22 MED ORDER — ONDANSETRON HCL 4 MG/2ML IJ SOLN
INTRAMUSCULAR | Status: DC | PRN
  Administered 2021-12-22: 4 mg via INTRAVENOUS

## 2021-12-22 MED ORDER — EPHEDRINE SULFATE-NACL 50-0.9 MG/10ML-% IV SOSY
PREFILLED_SYRINGE | INTRAVENOUS | Status: DC | PRN
Start: 2021-12-22 — End: 2021-12-22
  Administered 2021-12-22: 5 mg via INTRAVENOUS

## 2021-12-22 MED ORDER — NA FERRIC GLUC CPLX IN SUCROSE 12.5 MG/ML IV SOLN
250.0000 mg | Freq: Every day | INTRAVENOUS | Status: AC
  Administered 2021-12-22 – 2021-12-25 (×4): 250 mg via INTRAVENOUS
  Filled 2021-12-22 (×4): qty 20

## 2021-12-22 MED ORDER — FENTANYL CITRATE (PF) 250 MCG/5ML IJ SOLN
INTRAMUSCULAR | Status: AC
  Filled 2021-12-22: qty 5

## 2021-12-22 MED ORDER — ACETAMINOPHEN 500 MG PO TABS
1000.0000 mg | ORAL_TABLET | Freq: Once | ORAL | Status: AC
  Administered 2021-12-22: 1000 mg via ORAL
  Filled 2021-12-22: qty 2

## 2021-12-22 MED ORDER — HEPARIN SODIUM (PORCINE) 1000 UNIT/ML IJ SOLN
INTRAMUSCULAR | Status: AC
  Filled 2021-12-22: qty 10

## 2021-12-22 MED ORDER — FENTANYL CITRATE (PF) 250 MCG/5ML IJ SOLN
INTRAMUSCULAR | Status: DC | PRN
  Administered 2021-12-22: 50 ug via INTRAVENOUS

## 2021-12-22 MED ORDER — PROPOFOL 10 MG/ML IV BOLUS
INTRAVENOUS | Status: DC | PRN
  Administered 2021-12-22: 120 mg via INTRAVENOUS

## 2021-12-22 MED ORDER — HEPARIN SODIUM (PORCINE) 1000 UNIT/ML IJ SOLN
INTRAMUSCULAR | Status: DC | PRN
  Administered 2021-12-22 (×2): 1600 [IU] via INTRAVENOUS

## 2021-12-22 MED ORDER — SUCCINYLCHOLINE CHLORIDE 200 MG/10ML IV SOSY
PREFILLED_SYRINGE | INTRAVENOUS | Status: AC
  Filled 2021-12-22: qty 10

## 2021-12-22 MED ORDER — SUCCINYLCHOLINE CHLORIDE 200 MG/10ML IV SOSY
PREFILLED_SYRINGE | INTRAVENOUS | Status: DC | PRN
  Administered 2021-12-22: 140 mg via INTRAVENOUS

## 2021-12-22 MED ORDER — FENTANYL CITRATE (PF) 100 MCG/2ML IJ SOLN
25.0000 ug | INTRAMUSCULAR | Status: DC | PRN
  Administered 2021-12-22: 25 ug via INTRAVENOUS

## 2021-12-22 MED ORDER — OXYCODONE HCL 5 MG PO TABS: 5.0000 mg | ORAL_TABLET | Freq: Once | ORAL | Status: DC | PRN

## 2021-12-22 MED ORDER — SODIUM CHLORIDE 0.9 % IV SOLN: INTRAVENOUS | Status: DC

## 2021-12-22 MED ORDER — CEFAZOLIN SODIUM-DEXTROSE 2-4 GM/100ML-% IV SOLN
INTRAVENOUS | Status: AC
  Filled 2021-12-22: qty 100

## 2021-12-22 MED ORDER — ORAL CARE MOUTH RINSE: 15.0000 mL | Freq: Once | OROMUCOSAL | Status: AC

## 2021-12-22 MED ORDER — PHENYLEPHRINE HCL-NACL 20-0.9 MG/250ML-% IV SOLN
INTRAVENOUS | Status: DC | PRN
  Administered 2021-12-22: 25 ug/min via INTRAVENOUS

## 2021-12-22 MED ORDER — SODIUM CHLORIDE 0.9% IV SOLUTION: Freq: Once | INTRAVENOUS | Status: AC

## 2021-12-22 MED ORDER — EPHEDRINE 5 MG/ML INJ
INTRAVENOUS | Status: AC
  Filled 2021-12-22: qty 5

## 2021-12-22 MED ORDER — LIDOCAINE 2% (20 MG/ML) 5 ML SYRINGE
INTRAMUSCULAR | Status: DC | PRN
Start: 2021-12-22 — End: 2021-12-22
  Administered 2021-12-22: 100 mg via INTRAVENOUS

## 2021-12-22 SURGICAL SUPPLY — 70 items
ADH SKN CLS APL DERMABOND .7 (GAUZE/BANDAGES/DRESSINGS) ×4
APL PRP STRL LF DISP 70% ISPRP (MISCELLANEOUS) ×2
APL SKNCLS STERI-STRIP NONHPOA (GAUZE/BANDAGES/DRESSINGS) ×2
ARMBAND PINK RESTRICT EXTREMIT (MISCELLANEOUS) ×4 IMPLANT
BAG COUNTER SPONGE SURGICOUNT (BAG) ×4 IMPLANT
BAG DECANTER FOR FLEXI CONT (MISCELLANEOUS) ×4 IMPLANT
BAG SPNG CNTER NS LX DISP (BAG) ×2
BENZOIN TINCTURE PRP APPL 2/3 (GAUZE/BANDAGES/DRESSINGS) ×4 IMPLANT
BIOPATCH RED 1 DISK 7.0 (GAUZE/BANDAGES/DRESSINGS) ×4 IMPLANT
CANISTER SUCT 3000ML PPV (MISCELLANEOUS) ×4 IMPLANT
CANNULA VESSEL 3MM 2 BLNT TIP (CANNULA) ×1 IMPLANT
CATH PALINDROME-P 19CM W/VT (CATHETERS) ×1 IMPLANT
CATH PALINDROME-P 23CM W/VT (CATHETERS) IMPLANT
CATH PALINDROME-P 28CM W/VT (CATHETERS) IMPLANT
CHLORAPREP W/TINT 26 (MISCELLANEOUS) ×4 IMPLANT
CLIP LIGATING EXTRA MED SLVR (CLIP) ×4 IMPLANT
CLIP LIGATING EXTRA SM BLUE (MISCELLANEOUS) ×4 IMPLANT
COVER PROBE W GEL 5X96 (DRAPES) ×4 IMPLANT
COVER SURGICAL LIGHT HANDLE (MISCELLANEOUS) ×4 IMPLANT
DERMABOND ADVANCED (GAUZE/BANDAGES/DRESSINGS) ×2
DERMABOND ADVANCED .7 DNX12 (GAUZE/BANDAGES/DRESSINGS) ×3 IMPLANT
DRAPE C-ARM 42X72 X-RAY (DRAPES) ×4 IMPLANT
DRAPE CHEST BREAST 15X10 FENES (DRAPES) ×4 IMPLANT
DRAPE HALF SHEET 40X57 (DRAPES) ×1 IMPLANT
DRSG TEGADERM 4X4.5 CHG (GAUZE/BANDAGES/DRESSINGS) ×1 IMPLANT
ELECT REM PT RETURN 9FT ADLT (ELECTROSURGICAL) ×3
ELECTRODE REM PT RTRN 9FT ADLT (ELECTROSURGICAL) ×3 IMPLANT
GAUZE 4X4 16PLY ~~LOC~~+RFID DBL (SPONGE) ×4 IMPLANT
GAUZE SPONGE 4X4 12PLY STRL (GAUZE/BANDAGES/DRESSINGS) ×4 IMPLANT
GAUZE SPONGE 4X4 12PLY STRL LF (GAUZE/BANDAGES/DRESSINGS) ×1 IMPLANT
GLOVE SURG ENC MOIS LTX SZ7.5 (GLOVE) ×4 IMPLANT
GLOVE SURG POLYISO LF SZ8 (GLOVE) ×6 IMPLANT
GLOVE SURG UNDER POLY LF SZ6.5 (GLOVE) ×1 IMPLANT
GOWN STRL REUS W/ TWL LRG LVL3 (GOWN DISPOSABLE) ×6 IMPLANT
GOWN STRL REUS W/ TWL XL LVL3 (GOWN DISPOSABLE) ×3 IMPLANT
GOWN STRL REUS W/TWL LRG LVL3 (GOWN DISPOSABLE) ×6
GOWN STRL REUS W/TWL XL LVL3 (GOWN DISPOSABLE) ×3
KIT BASIN OR (CUSTOM PROCEDURE TRAY) ×4 IMPLANT
KIT MICROPUNCTURE NIT STIFF (SHEATH) ×1 IMPLANT
KIT PALINDROME-P 55CM (CATHETERS) IMPLANT
KIT TURNOVER KIT B (KITS) ×4 IMPLANT
NDL 18GX1X1/2 (RX/OR ONLY) (NEEDLE) ×3 IMPLANT
NDL HYPO 25GX1X1/2 BEV (NEEDLE) ×3 IMPLANT
NEEDLE 18GX1X1/2 (RX/OR ONLY) (NEEDLE) ×3 IMPLANT
NEEDLE HYPO 25GX1X1/2 BEV (NEEDLE) ×3 IMPLANT
NS IRRIG 1000ML POUR BTL (IV SOLUTION) ×4 IMPLANT
PACK CV ACCESS (CUSTOM PROCEDURE TRAY) ×4 IMPLANT
PACK SURGICAL SETUP 50X90 (CUSTOM PROCEDURE TRAY) ×4 IMPLANT
PAD ARMBOARD 7.5X6 YLW CONV (MISCELLANEOUS) ×8 IMPLANT
SET MICROPUNCTURE 5F STIFF (MISCELLANEOUS) ×4 IMPLANT
SLING ARM FOAM STRAP LRG (SOFTGOODS) IMPLANT
SOAP 2 % CHG 4 OZ (WOUND CARE) ×4 IMPLANT
SPONGE T-LAP 18X18 ~~LOC~~+RFID (SPONGE) ×1 IMPLANT
STRIP CLOSURE SKIN 1/2X4 (GAUZE/BANDAGES/DRESSINGS) ×4 IMPLANT
SUT ETHILON 3 0 PS 1 (SUTURE) ×4 IMPLANT
SUT MNCRL AB 4-0 PS2 18 (SUTURE) ×6 IMPLANT
SUT PROLENE 6 0 BV (SUTURE) ×5 IMPLANT
SUT SILK 2 0 SH (SUTURE) IMPLANT
SUT SILK 3 0 (SUTURE) ×3
SUT SILK 3-0 18XBRD TIE 12 (SUTURE) IMPLANT
SUT VIC AB 3-0 SH 27 (SUTURE) ×6
SUT VIC AB 3-0 SH 27X BRD (SUTURE) ×3 IMPLANT
SYR 10ML LL (SYRINGE) ×4 IMPLANT
SYR 20ML LL LF (SYRINGE) ×8 IMPLANT
SYR 5ML LL (SYRINGE) ×4 IMPLANT
SYR CONTROL 10ML LL (SYRINGE) ×4 IMPLANT
TOWEL GREEN STERILE (TOWEL DISPOSABLE) ×4 IMPLANT
TOWEL GREEN STERILE FF (TOWEL DISPOSABLE) ×8 IMPLANT
UNDERPAD 30X36 HEAVY ABSORB (UNDERPADS AND DIAPERS) ×4 IMPLANT
WATER STERILE IRR 1000ML POUR (IV SOLUTION) ×4 IMPLANT

## 2021-12-22 SURGICAL SUPPLY — 54 items
ADH SKN CLS APL DERMABOND .7 (GAUZE/BANDAGES/DRESSINGS) ×1
BAG COUNTER SPONGE SURGICOUNT (BAG) ×3 IMPLANT
BAG SPNG CNTER NS LX DISP (BAG) ×1
BANDAGE ESMARK 6X9 LF (GAUZE/BANDAGES/DRESSINGS) IMPLANT
BNDG CMPR 9X6 STRL LF SNTH (GAUZE/BANDAGES/DRESSINGS)
BNDG ELASTIC 4X5.8 VLCR STR LF (GAUZE/BANDAGES/DRESSINGS) ×1 IMPLANT
BNDG ESMARK 6X9 LF (GAUZE/BANDAGES/DRESSINGS)
BNDG GAUZE ELAST 4 BULKY (GAUZE/BANDAGES/DRESSINGS) ×1 IMPLANT
CANISTER SUCT 3000ML PPV (MISCELLANEOUS) ×3 IMPLANT
CATH EMB 3FR 80CM (CATHETERS) IMPLANT
CATH EMB 4FR 40CM (CATHETERS) ×1 IMPLANT
CATH EMB 4FR 80CM (CATHETERS) IMPLANT
CATH EMB 5FR 80CM (CATHETERS) IMPLANT
CLIP LIGATING EXTRA MED SLVR (CLIP) ×3 IMPLANT
CLIP LIGATING EXTRA SM BLUE (MISCELLANEOUS) ×3 IMPLANT
CUFF TOURN SGL QUICK 34 (TOURNIQUET CUFF)
CUFF TOURN SGL QUICK 42 (TOURNIQUET CUFF) IMPLANT
CUFF TRNQT CYL 34X4.125X (TOURNIQUET CUFF) IMPLANT
DERMABOND ADVANCED (GAUZE/BANDAGES/DRESSINGS) ×1
DERMABOND ADVANCED .7 DNX12 (GAUZE/BANDAGES/DRESSINGS) ×2 IMPLANT
DRAIN SNY 10X20 3/4 PERF (WOUND CARE) IMPLANT
DRAPE X-RAY CASS 24X20 (DRAPES) IMPLANT
DRSG ADAPTIC 3X8 NADH LF (GAUZE/BANDAGES/DRESSINGS) ×1 IMPLANT
ELECT REM PT RETURN 9FT ADLT (ELECTROSURGICAL) ×2
ELECTRODE REM PT RTRN 9FT ADLT (ELECTROSURGICAL) ×2 IMPLANT
EVACUATOR SILICONE 100CC (DRAIN) IMPLANT
GAUZE 4X4 16PLY ~~LOC~~+RFID DBL (SPONGE) ×1 IMPLANT
GAUZE SPONGE 4X4 12PLY STRL LF (GAUZE/BANDAGES/DRESSINGS) ×1 IMPLANT
GLOVE SURG MICRO LTX SZ7.5 (GLOVE) ×6 IMPLANT
GOWN STRL REUS W/ TWL LRG LVL3 (GOWN DISPOSABLE) ×6 IMPLANT
GOWN STRL REUS W/TWL LRG LVL3 (GOWN DISPOSABLE) ×6
KIT BASIN OR (CUSTOM PROCEDURE TRAY) ×3 IMPLANT
KIT TURNOVER KIT B (KITS) ×3 IMPLANT
NS IRRIG 1000ML POUR BTL (IV SOLUTION) ×6 IMPLANT
PACK CV ACCESS (CUSTOM PROCEDURE TRAY) ×1 IMPLANT
PAD ARMBOARD 7.5X6 YLW CONV (MISCELLANEOUS) ×6 IMPLANT
PADDING CAST COTTON 6X4 STRL (CAST SUPPLIES) IMPLANT
SET COLLECT BLD 21X3/4 12 (NEEDLE) IMPLANT
SPONGE T-LAP 18X18 ~~LOC~~+RFID (SPONGE) ×1 IMPLANT
STAPLER VISISTAT 35W (STAPLE) ×1 IMPLANT
STOPCOCK 4 WAY LG BORE MALE ST (IV SETS) IMPLANT
SUT ETHILON 3 0 PS 1 (SUTURE) IMPLANT
SUT PROLENE 5 0 C 1 24 (SUTURE) ×2 IMPLANT
SUT PROLENE 6 0 BV (SUTURE) ×2 IMPLANT
SUT PROLENE 6 0 CC (SUTURE) ×2 IMPLANT
SUT VIC AB 2-0 CTX 36 (SUTURE) ×2 IMPLANT
SUT VIC AB 3-0 SH 27 (SUTURE) ×4
SUT VIC AB 3-0 SH 27X BRD (SUTURE) ×2 IMPLANT
SYR 3ML LL SCALE MARK (SYRINGE) ×3 IMPLANT
TOWEL GREEN STERILE (TOWEL DISPOSABLE) ×3 IMPLANT
TRAY FOLEY MTR SLVR 16FR STAT (SET/KITS/TRAYS/PACK) ×2 IMPLANT
TUBING EXTENTION W/L.L. (IV SETS) IMPLANT
UNDERPAD 30X36 HEAVY ABSORB (UNDERPADS AND DIAPERS) ×3 IMPLANT
WATER STERILE IRR 1000ML POUR (IV SOLUTION) ×3 IMPLANT

## 2021-12-22 NOTE — Anesthesia Procedure Notes (Signed)
Procedure Name: Intubation Date/Time: 12/22/2021 10:09 PM Performed by: Keneshia Tena T, CRNA Pre-anesthesia Checklist: Patient identified, Emergency Drugs available, Suction available and Patient being monitored Patient Re-evaluated:Patient Re-evaluated prior to induction Oxygen Delivery Method: Circle system utilized Preoxygenation: Pre-oxygenation with 100% oxygen Induction Type: IV induction, Rapid sequence and Cricoid Pressure applied Ventilation: Mask ventilation without difficulty Laryngoscope Size: Miller and 2 Grade View: Grade I Tube type: Oral Tube size: 7.0 mm Number of attempts: 1 Airway Equipment and Method: Stylet and Oral airway Placement Confirmation: ETT inserted through vocal cords under direct vision, positive ETCO2 and breath sounds checked- equal and bilateral Secured at: 22 cm Tube secured with: Tape Dental Injury: Teeth and Oropharynx as per pre-operative assessment

## 2021-12-22 NOTE — Progress Notes (Signed)
Subjective:  UOP 1550 -   BUN and crt up -  planning for [facility], second stage BVT and first HD today -  hgb 6.6-  transfusion ordered -  pt is nervous   Objective Vital signs in last 24 hours: Vitals:   12/21/21 1934 12/22/21 0223 12/22/21 0225 12/22/21 0656  BP: (!) 126/57  116/60 135/65  Pulse: 71   70  Resp: 18   16  Temp: 97.7 F (36.5 C)   97.6 F (36.4 C)  TempSrc: Oral   Oral  SpO2: 95%   95%  Weight:  58.7 kg    Height:       Weight change: -0.998 kg  Intake/Output Summary (Last 24 hours) at 12/22/2021 0817 Last data filed at 12/22/2021 0700 Gross per 24 hour  Intake 123 ml  Output 1550 ml  Net -1427 ml    Assessment/Plan: 76 year old black female with advanced CKD now with symptoms of volume overload and uremia 1.Renal- advanced CKD with uremic symptoms, low albumin, volume overload as well as anemia.   She now understands that she will need dialysis in order to feel better.  Agrees to the second stage of her BVT and tunneled dialysis catheter placement as well as initiation of dialysis-  appreciate VVS-  this is planned for today followed by her first HD treatment.  Patient would like to do third shift at [facility] if possible as an OP -have informed renal navigator and renal educator of patient's presence in the hospital.  Once outpatient spot has been established she would be able to be discharged.  Do not anticipate a long hospitalization here-maybe after just 1 -2 HD treatment 2. Hypertension/volume  -is overloaded and having symptoms-did respond to Lasix dosing.  We will continue it every 12 hours until we can establish dialysis-  stop lasix today  3. Anemia  -this is significant and likely contributing to her symptoms.  I gave ESA and  iron stores are low, will replete-trying to avoid transfusion as patient might be candidate for kidney transplant in the near future but now with hgb 6.6-  needs transfusion  4. Bones- PTH 159 -  phos is high-  have started Kristen Loader    Labs: Basic Metabolic Panel: Recent Labs  Lab 12/20/21 0650 12/21/21 0405 12/22/21 0349  NA 141 138 138  K 4.8 4.0 3.9  CL 115* 111 109  CO2 14* 15* 18*  GLUCOSE 89 81 120*  BUN 73* 85* 90*  CREATININE 6.05* 6.19* 6.33*  CALCIUM 8.6* 8.5* 8.3*  PHOS  --  5.8*  --    Liver Function Tests: Recent Labs  Lab 12/21/21 0405  ALBUMIN 2.6*   No results for input(s): LIPASE, AMYLASE in the last 168 hours. No results for input(s): AMMONIA in the last 168 hours. CBC: Recent Labs  Lab 12/19/21 1358 12/19/21 1443 12/19/21 1444 12/22/21 0349  WBC 4.6  --   --  3.6*  NEUTROABS 3.3  --   --   --   HGB 7.7* 7.5* 7.1* 6.6*  HCT 23.3* 22.0* 21.0* 19.3*  MCV 87.3  --   --  85.0  PLT 207  --   --  183   Cardiac Enzymes: No results for input(s): CKTOTAL, CKMB, CKMBINDEX, TROPONINI in the last 168 hours. CBG: Recent Labs  Lab 12/21/21 0612 12/21/21 1136 12/21/21 1636 12/21/21 2013 12/22/21 0653  GLUCAP 74 92 109* 135* 99    Iron  Studies:  Recent Labs    12/21/21 0405  IRON 27*  TIBC 246*  FERRITIN 285   Studies/Results: ECHOCARDIOGRAM COMPLETE  Result Date: 12/20/2021    ECHOCARDIOGRAM REPORT   Patient Name:   Jackie Wade Date of Exam: 12/20/2021 Medical Rec #:  [id number]       Height:       63.0 in Accession #:    [id number]      Weight:       141.0 lb Date of Birth:  01/27/1946       BSA:          1.667 m Patient Age:    40 years        BP:           132/67 mmHg Patient Gender: F               HR:           77 bpm. Exam Location:  Inpatient Procedure: 2D Echo Indications:    CHF  History:        Patient has no prior history of Echocardiogram examinations.                 Risk Factors:Hypertension and Diabetes.  Sonographer:    Jefferey Pica Referring Phys: Reading  1. Left ventricular ejection fraction, by estimation, is 40 to 45%. The left ventricle has mildly decreased function. The left ventricle  demonstrates global hypokinesis. There is severe concentric left ventricular hypertrophy. Left ventricular diastolic  parameters are consistent with Grade II diastolic dysfunction (pseudonormalization).  2. Right ventricular systolic function is normal. The right ventricular size is normal. Mildly increased right ventricular wall thickness. There is moderately elevated pulmonary artery systolic pressure. The estimated right ventricular systolic pressure  is 16.0 mmHg.  3. Left atrial size was moderately dilated.  4. A small pericardial effusion is present. The pericardial effusion is circumferential. There is no evidence of cardiac tamponade. Large pleural effusion in the left lateral region.  5. Mild to moderate mitral valve regurgitation and eccentric.  6. Tricuspid valve regurgitation is mild to moderate.  7. The aortic valve is tricuspid. Aortic valve regurgitation is not visualized.  8. The inferior vena cava is dilated in size with <50% respiratory variability, suggesting right atrial pressure of 15 mmHg. Comparison(s): No prior Echocardiogram. Conclusion(s)/Recommendation(s): Notable biventricular hypertrophy, atrial enlargement, and pericardial effusion. Outpatient pyrophosphate imaging for amyloidosis may be reasonable if clinically indicated. FINDINGS  Left Ventricle: Left ventricular ejection fraction, by estimation, is 40 to 45%. The left ventricle has mildly decreased function. The left ventricle demonstrates global hypokinesis. The left ventricular internal cavity size was normal in size. There is  severe concentric left ventricular hypertrophy. Left ventricular diastolic parameters are consistent with Grade II diastolic dysfunction (pseudonormalization). Right Ventricle: The right ventricular size is normal. Mildly increased right ventricular wall thickness. Right ventricular systolic function is normal. There is moderately elevated pulmonary artery systolic pressure. The tricuspid regurgitant  velocity is 2.90 m/s, and with an assumed right atrial pressure of 15 mmHg, the estimated right ventricular systolic pressure is 10.9 mmHg. Left Atrium: Left atrial size was moderately dilated. Right Atrium: Right atrial size was normal in size. Pericardium: A small pericardial effusion is present. The pericardial effusion is circumferential. There is no evidence of cardiac tamponade. Mitral Valve: The mitral valve is normal in structure. Mild to moderate mitral valve regurgitation. Tricuspid Valve: The tricuspid valve is normal in structure. Tricuspid valve regurgitation is  mild to moderate. No evidence of tricuspid stenosis. Aortic Valve: The aortic valve is tricuspid. Aortic valve regurgitation is not visualized. Aortic valve peak gradient measures 9.1 mmHg. Pulmonic Valve: The pulmonic valve was normal in structure. Pulmonic valve regurgitation is mild. No evidence of pulmonic stenosis. Aorta: The aortic root and ascending aorta are structurally normal, with no evidence of dilitation. Venous: The inferior vena cava is dilated in size with less than 50% respiratory variability, suggesting right atrial pressure of 15 mmHg. IAS/Shunts: The interatrial septum was not well visualized. Additional Comments: There is a large pleural effusion in the left lateral region.  LEFT VENTRICLE PLAX 2D LVIDd:         5.40 cm      Diastology LVIDs:         4.00 cm      LV e' medial:    3.57 cm/s LV PW:         1.50 cm      LV E/e' medial:  31.1 LV IVS:        1.60 cm      LV e' lateral:   5.15 cm/s LVOT diam:     2.00 cm      LV E/e' lateral: 21.6 LV SV:         83 LV SV Index:   50 LVOT Area:     3.14 cm  LV Volumes (MOD) LV vol d, MOD A4C: 117.0 ml LV vol s, MOD A4C: 74.6 ml LV SV MOD A4C:     117.0 ml RIGHT VENTRICLE             IVC RV Basal diam:  2.80 cm     IVC diam: 2.60 cm RV S prime:     11.50 cm/s TAPSE (M-mode): 2.3 cm LEFT ATRIUM             Index        RIGHT ATRIUM           Index LA diam:        4.10 cm 2.46 cm/m    RA Area:     16.90 cm LA Vol (A2C):   82.4 ml 49.44 ml/m  RA Volume:   48.10 ml  28.86 ml/m LA Vol (A4C):   64.2 ml 38.52 ml/m LA Biplane Vol: 76.6 ml 45.96 ml/m  AORTIC VALVE                 PULMONIC VALVE AV Area (Vmax): 2.43 cm     PV Vmax:       0.78 m/s AV Vmax:        151.00 cm/s  PV Peak grad:  2.5 mmHg AV Peak Grad:   9.1 mmHg LVOT Vmax:      117.00 cm/s LVOT Vmean:     76.100 cm/s LVOT VTI:       0.265 m  AORTA Ao Root diam: 3.30 cm Ao Asc diam:  3.20 cm MITRAL VALVE                TRICUSPID VALVE MV Area (PHT): 4.26 cm     TR Peak grad:   33.6 mmHg MV Decel Time: 178 msec     TR Vmax:        290.00 cm/s MV E velocity: 111.00 cm/s MV A velocity: 123.00 cm/s  SHUNTS MV E/A ratio:  0.90         Systemic VTI:  0.26 m  Systemic Diam: 2.00 cm Rudean Haskell MD Electronically signed by Rudean Haskell MD Signature Date/Time: 12/20/2021/1:22:08 PM    Final    Medications: Infusions:  sodium chloride     cefUROXime (ZINACEF)  IV     furosemide 160 mg (12/22/21 0227)    Scheduled Medications:  sodium chloride   Intravenous Once   acetaminophen  650 mg Oral Once   carvedilol  6.25 mg Oral BID WC   Chlorhexidine Gluconate Cloth  6 each Topical Q0600   darbepoetin (ARANESP) injection - NON-DIALYSIS  200 mcg Subcutaneous Q Tue-1800   diphenhydrAMINE  25 mg Intravenous Once   ferric citrate  210 mg Oral TID WC   heparin  5,000 Units Subcutaneous Q8H   insulin aspart  0-9 Units Subcutaneous TID WC   rosuvastatin  15 mg Oral Daily   sodium chloride flush  3 mL Intravenous Q12H    have reviewed scheduled and prn medications.  Physical Exam: General:  NAD-  nervous and upset this AM Heart: RRR Lungs: mostly clear Abdomen: soft, non tender Extremities: no edema Dialysis Access: left AVF-  positive thrill     12/22/2021,8:17 AM  LOS: 3 days

## 2021-12-22 NOTE — Op Note (Signed)
DATE OF SERVICE: 12/22/2021  PATIENT:  Jackie Wade  76 y.o. female  PRE-OPERATIVE DIAGNOSIS:  hematoma after second stage brachiobasilic arteriovenous fistula  POST-OPERATIVE DIAGNOSIS:  Same  PROCEDURE:   incision and drainage of left arm brachiobasilic arteriovenous fistula  SURGEON:  Surgeon(s) and Role:    * Cherre Robins, MD - Primary  ASSISTANT: none  ANESTHESIA:   general  EBL: minimal from procedure, about 226mL hematoma evacuated from arm.  BLOOD ADMINISTERED:none  DRAINS: none   LOCAL MEDICATIONS USED:  NONE  SPECIMEN:  none  COUNTS: confirmed correct.  TOURNIQUET:  none  PATIENT DISPOSITION:  PACU - hemodynamically stable.   Delay start of Pharmacological VTE agent (>24hrs) due to surgical blood loss or risk of bleeding: no  INDICATION FOR PROCEDURE: [name] is a 76 y.o. female with hematoma after second stage basilic vein transposition. After discussion of risks, benefits, and alternatives the patient was offered incision and drainage. The patient understood and wished to proceed.  OPERATIVE FINDINGS: hematoma throughout the surgical bed. No discrete source. All surfaces oozing. Closed loosely to allow drainage. Bulky bandage applied.  DESCRIPTION OF PROCEDURE: After identification of the patient in the pre-operative holding area, the patient was transferred to the operating room. The patient was positioned supine on the operating room table. Anesthesia was induced. The left arm was prepped and draped in standard fashion. A surgical pause was performed confirming correct patient, procedure, and operative location.  The previous "Skip" incisions were reopened sharply.  Hematoma was encountered that was fairly fresh.  This was irrigated thoroughly from the surgical beds.  I did not find a discrete source for the bleeding.  All the incisions were inspected carefully and systematically irrigated again to look for active bleeding.  No source was  identified.  I inspected the fistula.  I could not feel a strong thrill.  I performed a thrombectomy of the fistula.  Fairly good backbleeding was achieved.  Torrential inflow was achieved.  The fistulotomy was repaired with 6-0 Prolene suture.  The fistula was interrogated with Doppler machine proximally and distally.  Strong thrill was heard.  I inspected the wounds again.  I did not find any source for bleeding.  The wounds were again copiously irrigated.  The wounds were closed loosely using 3-0 Vicryl in a surgical stapler.  A bulky bandage was applied.  Upon completion of the case instrument and sharps counts were confirmed correct. The patient was transferred to the PACU in good condition. I was present for all portions of the procedure.  [name], MD [facility] Phone Number: [phone] 12/22/2021 10:56 PM

## 2021-12-22 NOTE — Progress Notes (Signed)
Notified by RN that Hgb this am is 6.6. was 7.1 yesterday.  Pt to have HD catheter placed today per RN report.   Pt is on lasix bid.  Has hx of CHF that is newly diagnosised. Hgb target is 8-10 with CHF.   Transfuse one unit PRBC now. Continue with lasix to make sure does not get volume overloaded.   Monitor closely

## 2021-12-22 NOTE — Progress Notes (Signed)
[facility] [place] PROGRESS NOTE  ASSESSMENT / PLAN: Jackie Wade is a 76 y.o. female with ESRD. In need of dialysis access. Plan second stage basilic vein transposition and tunneled dialysis catheter placement in OR today.   SUBJECTIVE: All questions answered.  OBJECTIVE: BP (!) 156/65    Pulse 68    Temp 97.6 F (36.4 C) (Oral)    Resp 18    Ht 5\' 3"  (1.6 m)    Wt 58.7 kg    SpO2 100%    BMI 22.90 kg/m   Intake/Output Summary (Last 24 hours) at 12/22/2021 1113 Last data filed at 12/22/2021 0831 Gross per 24 hour  Intake 123 ml  Output 1850 ml  Net -1727 ml    Constitutional: chronically ill appearing. no acute distress. Cardiac: RRR. Pulmonary: unlabored Abdomen: soft Vascular: left arm AVF with strong thrill  CBC Latest Ref Rng & Units 12/22/2021 12/19/2021 12/19/2021  WBC 4.0 - 10.5 K/uL 3.6(L) - -  Hemoglobin 12.0 - 15.0 g/dL 6.6(LL) 7.1(L) 7.5(L)  Hematocrit 36.0 - 46.0 % 19.3(L) 21.0(L) 22.0(L)  Platelets 150 - 400 K/uL 183 - -     CMP Latest Ref Rng & Units 12/22/2021 12/21/2021 12/20/2021  Glucose 70 - 99 mg/dL 120(H) 81 89  BUN 8 - 23 mg/dL 90(H) 85(H) 73(H)  Creatinine 0.44 - 1.00 mg/dL 6.33(H) 6.19(H) 6.05(H)  Sodium 135 - 145 mmol/L 138 138 141  Potassium 3.5 - 5.1 mmol/L 3.9 4.0 4.8  Chloride 98 - 111 mmol/L 109 111 115(H)  CO2 22 - 32 mmol/L 18(L) 15(L) 14(L)  Calcium 8.9 - 10.3 mg/dL 8.3(L) 8.5(L) 8.6(L)  Total Protein 6.5 - 8.1 g/dL - - -  Total Bilirubin 0.3 - 1.2 mg/dL - - -  Alkaline Phos 38 - 126 U/L - - -  AST 15 - 41 U/L - - -  ALT 0 - 44 U/L - - -    Estimated Creatinine Clearance: 6.5 mL/min (A) (by C-G formula based on SCr of 6.33 mg/dL (H)).  Jackie Wade. Jackie Breed, MD [facility] Phone Number: [phone] 12/22/2021 11:13 AM

## 2021-12-22 NOTE — TOC Initial Note (Signed)
Transition of Care [facility]) - Initial/Assessment Note    Patient Details  Name: Jackie Wade MRN: [id number] Date of Birth: May 09, 1946  Transition of Care [facility]) CM/SW Contact:    Zenon Mayo, RN Phone Number: 12/22/2021, 3:31 PM  Clinical Narrative:                 NCM spoke with patient at the bedside, she states that she lives alone, she does not have any DME at home. She states she can arranged transportation at discharge.  She is ok with medications being filled at the [facility] prior to discharge.  TOC will continue to follow for dc needs.   Expected Discharge Plan: Home/Self Care Barriers to Discharge: Continued Medical Work up   Patient Goals and CMS Choice Patient states their goals for this hospitalization and ongoing recovery are:: return home   Choice offered to / list presented to : NA  Expected Discharge Plan and Services Expected Discharge Plan: Home/Self Care In-house Referral: NA Discharge Planning Services: CM Consult Post Acute Care Choice: NA Living arrangements for the past 2 months: Single Family Home                   DME Agency: NA       HH Arranged: NA          Prior Living Arrangements/Services Living arrangements for the past 2 months: Single Family Home Lives with:: Self Patient language and need for interpreter reviewed:: Yes Do you feel safe going back to the place where you live?: Yes      Need for Family Participation in Patient Care: No (Comment) Care giver support system in place?: No (comment)   Criminal Activity/Legal Involvement Pertinent to Current Situation/Hospitalization: No - Comment as needed  Activities of Daily Living Home Assistive Devices/Equipment: None ADL Screening (condition at time of admission) Patient's cognitive ability adequate to safely complete daily activities?: Yes Is the patient deaf or have difficulty hearing?: No Does the patient have difficulty seeing, even when wearing  glasses/contacts?: No Does the patient have difficulty concentrating, remembering, or making decisions?: No Patient able to express need for assistance with ADLs?: Yes Does the patient have difficulty dressing or bathing?: No Independently performs ADLs?: Yes (appropriate for developmental age) Does the patient have difficulty walking or climbing stairs?: No Weakness of Legs: Both Weakness of Arms/Hands: None  Permission Sought/Granted                  Emotional Assessment   Attitude/Demeanor/Rapport:  (sleepy) Affect (typically observed): Appropriate Orientation: : Oriented to Self, Oriented to Place, Oriented to  Time, Oriented to Situation Alcohol / Substance Use: Not Applicable Psych Involvement: No (comment)  Admission diagnosis:  SOB (shortness of breath) [R06.02] Chronic kidney disease (CKD), stage V ([place]) [N18.5] New onset of congestive heart failure ([place]) [I50.9] Patient Active Problem List   Diagnosis Date Noted   New onset of congestive heart failure ([place]) 12/19/2021   CKD (chronic kidney disease) stage 5, GFR less than 15 ml/min ([place]) 07/01/2021   Symptomatic anemia 07/01/2021   DM2 (diabetes mellitus, type 2) ([place]) 06/28/2021   Screening for malignant neoplasm of colon 06/28/2021   Mixed hyperlipidemia 06/28/2021   Vitamin D deficiency 06/28/2021   Asymmetrical sensorineural hearing loss 06/17/2021   Steroid-induced hyperglycemia 06/10/2021   Hyperlipidemia due to type 2 diabetes mellitus ([place]) 06/10/2021   Anemia due to chronic kidney disease 06/10/2021   FSGS (focal segmental glomerulosclerosis) 06/10/2021   Hypokalemia 06/10/2021  Renal disorder 06/09/2021   Hyperosmolar hyperglycemic state (HHS) ([place]) 06/09/2021   Severe hyperglycemia due to diabetes mellitus ([place])    Hyponatremia    AKI (acute kidney injury) ([place])    Allergic rhinitis 05/17/2021   Bilateral impacted cerumen 05/17/2021   Essential hypertension 11/12/2020   PCP:  Lucianne Lei,  MD Pharmacy:   [facility] [place], [place] [address] [place] [address] Phone: [phone] Fax: [phone]     Social Determinants of Health (SDOH) Interventions    Readmission Risk Interventions Readmission Risk Prevention Plan 12/22/2021  Transportation Screening Complete  HRI or [place] Complete  Social Work Consult for [place] Planning/Counseling Complete  Palliative Care Screening Not Applicable  Medication Review Press photographer) Complete  Some recent data might be hidden

## 2021-12-22 NOTE — Anesthesia Postprocedure Evaluation (Signed)
Anesthesia Post Note  Patient: Jackie Wade  Procedure(s) Performed: IRRIGATION AND DEBRIDEMENT HEMATOMA (Left: Arm Upper)     Patient location during evaluation: PACU Anesthesia Type: General Level of consciousness: awake Pain management: pain level controlled Vital Signs Assessment: post-procedure vital signs reviewed and stable Cardiovascular status: stable Postop Assessment: no apparent nausea or vomiting Anesthetic complications: no   No notable events documented.  Last Vitals:  Vitals:   12/22/21 2314 12/22/21 2330  BP: (!) 119/59 122/62  Pulse: 65 63  Resp: 10 10  Temp: 36.8 C   SpO2: 93% 94%    Last Pain:  Vitals:   12/22/21 2314  TempSrc:   PainSc: 5                  Xena Propst

## 2021-12-22 NOTE — Progress Notes (Addendum)
Pt has been approved for [facility] on a MWF schedule. Clinic has approved pt to start as soon as tomorrow but pt for procedure today. Unsure of d/c date at this time. Pt will need to arrive at 5:30 for 5:50 chair time. Attempted to meet with pt at bedside but pt was being transported down for procedure. Will attempt to meet with pt later today. Update provided to Nephrologist. Will assist as needed.   Melven Sartorius Renal Navigator [phone]   Addendum at 4:00 pm: Met with pt at bedside. Pt states she has just returned from procedure and requests that navigator return in the am to discuss clinic arrangements. Will f/u with pt in the am per her request.

## 2021-12-22 NOTE — Op Note (Signed)
DATE OF SERVICE: 12/22/2021  PATIENT:  Jackie Wade  76 y.o. female  PRE-OPERATIVE DIAGNOSIS:  ESRD  POST-OPERATIVE DIAGNOSIS:  Same  PROCEDURE:   1) right internal jugular tunneled dialysis catheter 2) left second-stage brachio-basilic arteriovenous fistula creation  SURGEON:  Surgeon(s) and Role:    * Cherre Robins, MD - Primary  ASSISTANT: Arlee Muslim, PA-C  An assistant was required to facilitate exposure and expedite the case.  ANESTHESIA:   general  EBL: minimal  BLOOD ADMINISTERED:none  DRAINS: none   LOCAL MEDICATIONS USED:  NONE  SPECIMEN:  none  COUNTS: confirmed correct.  TOURNIQUET:  none  PATIENT DISPOSITION:  PACU - hemodynamically stable.   Delay start of Pharmacological VTE agent (>24hrs) due to surgical blood loss or risk of bleeding: no  INDICATION FOR PROCEDURE: Sherrell Weir is a 76 y.o. female with ESRD in need of dialysis access. My partner created a first stage basilic vein transposition 07/04/21. This has matured nicely. After careful discussion of risks, benefits, and alternatives the patient was offered second stage basilic vein transposition and tunneled dialysis catheter. The patient understood and wished to proceed.  OPERATIVE FINDINGS: unremarkable TDC and second stage basilic vein transposition.  DESCRIPTION OF PROCEDURE: After identification of the patient in the pre-operative holding area, the patient was transferred to the operating room. The patient was positioned supine on the operating room table. Anesthesia was induced. The neck and left arm were prepped and draped in standard fashion. A surgical pause was performed confirming correct patient, procedure, and operative location.  Using ultrasound guidance the right internal jugular vein was accessed with micropuncture technique.  Through the micropuncture sheath a floppy J-wire was advanced into the superior vena cava.  A small incision was made around the skin access point.  The  access point was serially dilated under direct fluoroscopic guidance.  A peel-away sheath was introduced into the superior vena cava under fluoroscopic guidance.  A counterincision was made in the chest under the clavicle.  A 19 cm tunnel dialysis catheter was then tunneled under the skin, over the clavicle into the incision in the neck.  The tunneling device was removed and the catheter fed through the peel-away sheath into the superior vena cava.  The peel-away sheath was removed and the catheter gently pulled back.  Adequate position was confirmed with x-ray.  The catheter was tested and found to flush and draw back well.  Catheter was heparin locked.  Caps were applied.  Catheter was sutured to the skin.  The neck incision was closed with 4-0 Monocryl.  Using intraoperative ultrasound the course of the left basilic vein was marked on the skin.  3 skip incisions were made over the course of the basilic vein fistula.  These were carried down through subcutaneous tissue until the fistula was encountered.  The fascia was skeletonized from the axilla to the anastomosis, taking care to ligate and divide sidebranches, and to protect the medial antebrachial cutaneous nerve.   A subcutaneous tunnel was created over the biceps using a sheath tunneling device.  Patient was heparinized.  The fistula near the anastomosis was clamped.  The outflow in the axilla was clamped.  The fistula was divided.  The fistula was marked to ensure no twisting or kinking while delivering the fistula through the tunnel.  The fistula was tunneled through the arm and delivered near the previous anastomosis.  The fistula was spatulated proximally and distally.  The fistula was reanastomosed end-to-end using continuous running suture of 5-0  Prolene.  A palpable thrill was felt over the subcutaneous course of the tunnel.  Doppler flow was excellent in the proximal and distal fistula.  The wounds were copiously irrigated.  Hemostasis was  ensured in the surgical bed.  The wounds were closed in layers using 3-0 Vicryl and 4-0 Monocryl.  Upon completion of the case instrument and sharps counts were confirmed correct. The patient was transferred to the PACU in good condition. I was present for all portions of the procedure.  [name], MD [facility] Phone Number: [phone] 12/22/2021 2:13 PM

## 2021-12-22 NOTE — Anesthesia Procedure Notes (Signed)
Procedure Name: LMA Insertion Date/Time: 12/22/2021 11:57 AM Performed by: Barrington Ellison, CRNA Pre-anesthesia Checklist: Patient identified, Emergency Drugs available, Suction available and Patient being monitored Patient Re-evaluated:Patient Re-evaluated prior to induction Oxygen Delivery Method: Circle System Utilized Preoxygenation: Pre-oxygenation with 100% oxygen Induction Type: IV induction Ventilation: Mask ventilation without difficulty LMA: LMA inserted LMA Size: 4.0 Number of attempts: 1 Placement Confirmation: positive ETCO2 Tube secured with: Tape Dental Injury: Teeth and Oropharynx as per pre-operative assessment

## 2021-12-22 NOTE — Anesthesia Preprocedure Evaluation (Addendum)
Anesthesia Evaluation  Patient identified by MRN, date of birth, ID band Patient awake    Reviewed: Allergy & Precautions, NPO status , Patient's Chart, lab work & pertinent test results  Airway Mallampati: II  TM Distance: >3 FB     Dental   Pulmonary neg pulmonary ROS,    breath sounds clear to auscultation       Cardiovascular hypertension, +CHF   Rhythm:Regular Rate:Normal     Neuro/Psych    GI/Hepatic negative GI ROS, Neg liver ROS,   Endo/Other  diabetes  Renal/GU Renal disease     Musculoskeletal   Abdominal   Peds  Hematology   Anesthesia Other Findings   Reproductive/Obstetrics                            Anesthesia Physical Anesthesia Plan  ASA: 3  Anesthesia Plan: General   Post-op Pain Management:    Induction: Intravenous  PONV Risk Score and Plan: 3 and Ondansetron  Airway Management Planned: Oral ETT  Additional Equipment:   Intra-op Plan:   Post-operative Plan: Extubation in OR  Informed Consent: I have reviewed the patients History and Physical, chart, labs and discussed the procedure including the risks, benefits and alternatives for the proposed anesthesia with the patient or authorized representative who has indicated his/her understanding and acceptance.     Dental advisory given  Plan Discussed with: CRNA and Anesthesiologist  Anesthesia Plan Comments:         Anesthesia Quick Evaluation

## 2021-12-22 NOTE — Progress Notes (Addendum)
Progress Note   Patient: Jackie Wade IWL:[id number] DOB: January 09, 1946 DOA: 12/19/2021     3 DOS: the patient was seen and examined on 12/22/2021   Brief hospital course: Jackie Wade was admitted to the hospital with the working diagnosis of volume overload in the setting of CKD stage 5 progression to ESRD.  76 yo female with the past medical history of CKD stage 5 (1st stage BVT 06/2021), HTN, T2DM, and chronic anemia who presented with dyspnea. Reported worsening dyspnea for the last 7 to 10 days associated with dyspnea. On her initial physical examination her blood pressure was 173/80, HR 88, RR 22 and oxygen saturation of 98%, ill looking appearing, with decreased breath sounds bilaterally, heart with S1 and S2 present with no gallops or rubs, abdomen soft and positive 2+ lower extremity edema.   Na 140, K 4,1, CL 114, bicarb 17, glucose 140, BUN 778 and cr 5,99 BNP 1,7408 Wbc 4,6. Hgb 7,7, hct 23, 3 and plt 207 SARS COVID 19 negative  Urine analysis with sg 1,008.    Chest radiograph with bilateral small pleural effusions, bilateral interstitial infiltrates.   EKG with 85 bpm, normal axis, normal intervals, sinus rhythm, with no significant ST segment or T wave changes.   Patient was placed on diuretic therapy for volume overload.  Nephrology was consulted with recommendations to initiate renal replacement therapy. Vascular consulted for 2nd stage BVT and tunneled HD catheter placement.  01/26 patient had temporary HD cathter placed on right IJ vein and second stage brachio basilic arteriovenous fistula creation.   Assessment and Plan * CKD (chronic kidney disease) stage 5, GFR less than 15 ml/min (HCC)- (present on admission) Patient has progressed to ESRD. Today had 2nd step for vascular access to left upper extremity and tunneled HD catheter placed on right IJ vein.  Clinically with no signs of volume overload today.   Na is 138, K 3,9 and serum bicarbonate 18 Plan for renal  replacement therapy today per nephrology recommendations.   New onset of congestive heart failure (HCC) Hypervolemia mainly due to renal failure. Echocardiogram with preserved LV systolic function with EF 40 to 40% with global hypokinesis, RV systolic function preserved. Small pericardial effusion. Mild to moderate mitral valve regurgitation, tricuspid valve with moderate regurgitation.   Patient will start renal replacement therapy today.   Essential hypertension- (present on admission) On carvedilol for blood pressure control.   Patient will start hemodialysis today   Anemia due to chronic kidney disease- (present on admission) Positive iron deficiency combined with anemia or chronic renal disease. Iron panel with iron 27, TIBC 246, transferrin saturation 11 and ferritin 285.   Continue with IV iron and epo Her Hgb today is 6,6 and hct 19,3   Transfuse one unit PRBC today    Type 2 diabetes mellitus with hyperlipidemia ([place]) Her glucose has remained well controlled, fasting glucose this am is 120 Patient is tolerating po well.   Continue with insulin sliding scale for glucose cover and monitoring   Continue with statin therapy   Hyperlipidemia due to type 2 diabetes mellitus (HCC)-resolved as of 12/22/2021, (present on admission) Continue with statin therapy      Subjective: patient post procedure with pain at the cathter site, no nausea or vomiting no dyspnea.   Objective BP (!) 147/73 (BP Location: Right Arm)    Pulse 61    Temp (!) 97.3 F (36.3 C) (Oral)    Resp 12    Ht 5\' 3"  (1.6 m)  Wt 58.7 kg    SpO2 95%    BMI 22.90 kg/m   Neurology somnolent post procedure ENT positive pallor  Cardiovascular heart with S1 and S2 present and rhythmic with no gallops or rubs  Pulmonary lungs with no wheezing or rales   Abdominal soft and non tender Skin no rashes  Musculoskeletal   Data Reviewed:    Family Communication: no family at the bedside   Disposition: Status  is: Inpatient  Remains inpatient appropriate because: renal replacement therapy          Author: Tawni Millers, MD 12/22/2021 5:09 PM  For on call review [email or web address].

## 2021-12-22 NOTE — Anesthesia Postprocedure Evaluation (Signed)
Anesthesia Post Note  Patient: Jackie Wade  Procedure(s) Performed: LEFT ARM SECOND STAGE BASILIC VEIN TRANSPOSITION (Left) INSERTION OF 14.5 FR/CH X 19 CM  DIALYSIS CATHETER (Right)     Patient location during evaluation: PACU Anesthesia Type: General Level of consciousness: awake and alert and oriented Pain management: pain level controlled Vital Signs Assessment: post-procedure vital signs reviewed and stable Respiratory status: spontaneous breathing, nonlabored ventilation and respiratory function stable Cardiovascular status: blood pressure returned to baseline Postop Assessment: no apparent nausea or vomiting Anesthetic complications: no   No notable events documented.  Last Vitals:  Vitals:   12/22/21 1438 12/22/21 1453  BP: (!) 159/69 (!) 147/65  Pulse: 61 60  Resp: 14 10  Temp:  (!) 36.2 C  SpO2: 95% 94%    Last Pain:  Vitals:   12/22/21 1423  TempSrc:   PainSc: 0-No pain                 Marthenia Rolling

## 2021-12-22 NOTE — Anesthesia Preprocedure Evaluation (Addendum)
Anesthesia Evaluation  Patient identified by MRN, date of birth, ID band Patient awake    Reviewed: Allergy & Precautions, NPO status , Patient's Chart, lab work & pertinent test results  History of Anesthesia Complications Negative for: history of anesthetic complications  Airway Mallampati: II  TM Distance: >3 FB Neck ROM: Full    Dental  (+) Missing,    Pulmonary neg pulmonary ROS,    Pulmonary exam normal        Cardiovascular hypertension, Pt. on medications +CHF  Normal cardiovascular exam  TTE 12/20/21: EF 40-45%, global hypokinesis, severe LVH, grade II diastolic dysfunction, moderately elevated PASP 48.42mmHg, moderate LAE, small pericardial effusion, large pleural effusion in the left lateral region, mild to moderate MR/TR      Neuro/Psych negative neurological ROS  negative psych ROS   GI/Hepatic negative GI ROS, Neg liver ROS,   Endo/Other  diabetes, Type 2  Renal/GU ESRFRenal disease  negative genitourinary   Musculoskeletal negative musculoskeletal ROS (+)   Abdominal   Peds  Hematology  (+) anemia , Hgb 6.6, getting 1u pRBCs now   Anesthesia Other Findings Day of surgery medications reviewed with patient.  Reproductive/Obstetrics negative OB ROS                            Anesthesia Physical Anesthesia Plan  ASA: 4  Anesthesia Plan: General   Post-op Pain Management: Tylenol PO (pre-op)   Induction:   PONV Risk Score and Plan: 3 and Treatment may vary due to age or medical condition, Dexamethasone and Ondansetron  Airway Management Planned: LMA  Additional Equipment: None  Intra-op Plan:   Post-operative Plan: Extubation in OR  Informed Consent: I have reviewed the patients History and Physical, chart, labs and discussed the procedure including the risks, benefits and alternatives for the proposed anesthesia with the patient or authorized representative who has  indicated his/her understanding and acceptance.     Dental advisory given  Plan Discussed with: CRNA  Anesthesia Plan Comments:        Anesthesia Quick Evaluation

## 2021-12-22 NOTE — Progress Notes (Signed)
Called to bedside for evaluation of bleeding from operative site. Patient with tense hematoma in upper arm. Mild drainage from incisions. Patient uncomfortable. Offered her washout tonight to help with pain and promote fistula patency. She is amenable. Plan to proceed to OR as soon as possible.  [name], MD [facility] Phone Number: [phone] 12/22/2021 8:56 PM

## 2021-12-22 NOTE — Transfer of Care (Signed)
Immediate Anesthesia Transfer of Care Note  Patient: Jackie Wade  Procedure(s) Performed: LEFT ARM SECOND STAGE BASILIC VEIN TRANSPOSITION (Left) INSERTION OF 14.5 FR/CH X 19 CM  DIALYSIS CATHETER (Right)  Patient Location: PACU  Anesthesia Type:General  Level of Consciousness: awake and oriented  Airway & Oxygen Therapy: Patient Spontanous Breathing  Post-op Assessment: Report given to RN  Post vital signs: Reviewed and stable  Last Vitals:  Vitals Value Taken Time  BP 159/73   Temp    Pulse 64   Resp 16   SpO2 97     Last Pain:  Vitals:   12/22/21 1132  TempSrc: Oral  PainSc:          Complications: No notable events documented.

## 2021-12-22 NOTE — Progress Notes (Addendum)
CRITICAL VALUE STICKER  CRITICAL VALUE: hgb 6.6  RECEIVER (on-site recipient of call): Nicola Girt.,    DATE & TIME NOTIFIED: 12/22/20 0557  MESSENGER (representative from lab):  MD NOTIFIED: Chot[place], MD  TIME OF NOTIFICATION: 0601  RESPONSE:  orders for one unit of blood

## 2021-12-22 NOTE — Transfer of Care (Signed)
Immediate Anesthesia Transfer of Care Note  Patient: Jackie Wade  Procedure(s) Performed: IRRIGATION AND DEBRIDEMENT HEMATOMA (Left: Arm Upper)  Patient Location: PACU  Anesthesia Type:General  Level of Consciousness: drowsy and patient cooperative  Airway & Oxygen Therapy: Patient Spontanous Breathing and Patient connected to nasal cannula oxygen  Post-op Assessment: Report given to RN, Post -op Vital signs reviewed and stable and Patient moving all extremities  Post vital signs: Reviewed  Last Vitals:  Vitals Value Taken Time  BP 119/59 12/22/21 2313  Temp    Pulse 64 12/22/21 2315  Resp 14 12/22/21 2315  SpO2 94 % 12/22/21 2315  Vitals shown include unvalidated device data.  Last Pain:  Vitals:   12/22/21 2032  TempSrc: Temporal  PainSc: 0-No pain         Complications: No notable events documented.

## 2021-12-23 ENCOUNTER — Encounter (HOSPITAL_COMMUNITY): Payer: Self-pay | Admitting: Vascular Surgery

## 2021-12-23 DIAGNOSIS — D62 Acute posthemorrhagic anemia: Secondary | ICD-10-CM

## 2021-12-23 DIAGNOSIS — Z9889 Other specified postprocedural states: Secondary | ICD-10-CM

## 2021-12-23 DIAGNOSIS — I509 Heart failure, unspecified: Secondary | ICD-10-CM

## 2021-12-23 LAB — GLUCOSE, CAPILLARY
Glucose-Capillary: 104 mg/dL — ABNORMAL HIGH (ref 70–99)
Glucose-Capillary: 112 mg/dL — ABNORMAL HIGH (ref 70–99)
Glucose-Capillary: 135 mg/dL — ABNORMAL HIGH (ref 70–99)
Glucose-Capillary: 164 mg/dL — ABNORMAL HIGH (ref 70–99)
Glucose-Capillary: 94 mg/dL (ref 70–99)

## 2021-12-23 LAB — BASIC METABOLIC PANEL
Anion gap: 14 (ref 5–15)
BUN: 53 mg/dL — ABNORMAL HIGH (ref 8–23)
CO2: 17 mmol/L — ABNORMAL LOW (ref 22–32)
Calcium: 8.3 mg/dL — ABNORMAL LOW (ref 8.9–10.3)
Chloride: 107 mmol/L (ref 98–111)
Creatinine, Ser: 4.32 mg/dL — ABNORMAL HIGH (ref 0.44–1.00)
GFR, Estimated: 10 mL/min — ABNORMAL LOW (ref >60)
Glucose, Bld: 105 mg/dL — ABNORMAL HIGH (ref 70–99)
Potassium: 4.7 mmol/L (ref 3.5–5.1)
Sodium: 138 mmol/L (ref 135–145)

## 2021-12-23 LAB — CBC
HCT: 22 % — ABNORMAL LOW (ref 36.0–46.0)
Hemoglobin: 7.4 g/dL — ABNORMAL LOW (ref 12.0–15.0)
MCH: 28.8 pg (ref 26.0–34.0)
MCHC: 33.6 g/dL (ref 30.0–36.0)
MCV: 85.6 fL (ref 80.0–100.0)
Platelets: 163 10*3/uL (ref 150–400)
RBC: 2.57 MIL/uL — ABNORMAL LOW (ref 3.87–5.11)
RDW: 14.9 % (ref 11.5–15.5)
WBC: 6.9 10*3/uL (ref 4.0–10.5)
nRBC: 0 % (ref 0.0–0.2)

## 2021-12-23 MED ORDER — CHLORHEXIDINE GLUCONATE CLOTH 2 % EX PADS: 6.0000 | MEDICATED_PAD | Freq: Every day | CUTANEOUS | Status: DC

## 2021-12-23 MED ORDER — DESMOPRESSIN ACETATE 4 MCG/ML IJ SOLN
20.0000 ug | Freq: Once | INTRAMUSCULAR | Status: AC
  Administered 2021-12-23: 20 ug via INTRAVENOUS
  Filled 2021-12-23: qty 5

## 2021-12-23 MED ORDER — SODIUM CHLORIDE 0.9 % IV SOLN: INTRAVENOUS | Status: AC

## 2021-12-23 NOTE — Progress Notes (Signed)
Subjective:  Had procedures yesterday complicated by bleeding -  only had one unit prbc and hgb 7.4 this AM up from 6.6 so appropriate response-  also had 1st HD last night -  she is orthostatic this AM-  still making urine    Objective Vital signs in last 24 hours: Vitals:   12/23/21 0844 12/23/21 0847 12/23/21 0851 12/23/21 0900  BP: (!) 100/53 (!) 75/49 (!) 96/48   Pulse:   81   Resp:      Temp:      TempSrc:      SpO2:   96% 96%  Weight:      Height:       Weight change: 0.35 kg  Intake/Output Summary (Last 24 hours) at 12/23/2021 0954 Last data filed at 12/23/2021 0530 Gross per 24 hour  Intake 1761.5 ml  Output 1039 ml  Net 722.5 ml    Assessment/Plan: 76 year old black female with advanced CKD now with symptoms of volume overload and uremia 1.Renal- advanced CKD with uremic symptoms, low albumin, volume overload as well as anemia-  for initiation of dialysis this hosp.  Agrees to the second stage of her BVT and tunneled dialysis catheter placement -   done 3/61 but with complication of bleeding and hematoma evac.  Also had first HD last night.   Patient would like to do third shift at [facility] if possible as an OP - will not be possible for now-  have set up at [facility] MWF early AM.  My plan is to let her rest today-  and will do HD first shift in AM-  if otherwise doing well and cleared from vascular could possibly discharge tomorrow to start HD as OP on Monday  2. Hypertension/volume  -was overloaded and having symptoms-did respond to Lasix -  kept making good urine after lasix stopped yesterday-  now orthostatic-  will give maybe just 0.5 L back today just to get her not orthostatic 3. Anemia  -this is significant and likely contributing to her symptoms.  I gave ESA and  iron stores are low, will replete-trying to avoid transfusion as patient might be candidate for kidney transplant in the near future but then with hgb 6.6-  given one unit-  7.4 this AM 4. Bones- PTH 159 -  phos is high-   have started Kristen Loader    Labs: Basic Metabolic Panel: Recent Labs  Lab 12/21/21 0405 12/22/21 0349 12/23/21 0305  NA 138 138 138  K 4.0 3.9 4.7  CL 111 109 107  CO2 15* 18* 17*  GLUCOSE 81 120* 105*  BUN 85* 90* 53*  CREATININE 6.19* 6.33* 4.32*  CALCIUM 8.5* 8.3* 8.3*  PHOS 5.8*  --   --    Liver Function Tests: Recent Labs  Lab 12/21/21 0405  ALBUMIN 2.6*   No results for input(s): LIPASE, AMYLASE in the last 168 hours. No results for input(s): AMMONIA in the last 168 hours. CBC: Recent Labs  Lab 12/19/21 1358 12/19/21 1443 12/22/21 0349 12/22/21 1712 12/23/21 0305  WBC 4.6  --  3.6*  --  6.9  NEUTROABS 3.3  --   --   --   --   HGB 7.7*   < > 6.6* 7.1* 7.4*  HCT 23.3*   < > 19.3* 20.3* 22.0*  MCV 87.3  --  85.0  --  85.6  PLT 207  --  183  --  163   < > =  values in this interval not displayed.   Cardiac Enzymes: No results for input(s): CKTOTAL, CKMB, CKMBINDEX, TROPONINI in the last 168 hours. CBG: Recent Labs  Lab 12/22/21 1431 12/22/21 1638 12/22/21 2314 12/23/21 0035 12/23/21 0608  GLUCAP 89 80 70 94 112*    Iron Studies:  Recent Labs    12/21/21 0405  IRON 27*  TIBC 246*  FERRITIN 285   Studies/Results: DG CHEST PORT 1 VIEW  Result Date: 12/22/2021 CLINICAL DATA:  Shortness of breath EXAM: PORTABLE CHEST 1 VIEW COMPARISON:  12/19/2021 FINDINGS: Transverse diameter of heart is increased. Central pulmonary vessels are prominent. Increased density is seen in the left lower lung fields obscuring left hemidiaphragm. There is blunting of both lateral CP angles. There is improvement in aeration of right lower lung fields. There is interval placement of right IJ dialysis catheter with its tip in superior vena cava close to the right atrium. IMPRESSION: Cardiomegaly. Central pulmonary vessels are prominent without signs of alveolar pulmonary edema. Increased density in the left lower lung fields obscuring the left  hemidiaphragm suggests pleural effusion and possibly underlying atelectasis/pneumonia. There is interval improvement in aeration of right lower lung fields. Blunting of right lateral CP angle suggests small right pleural effusion. Electronically Signed   By: Elmer Picker M.D.   On: 12/22/2021 15:16   DG C-Arm 1-60 Min-No Report  Result Date: 12/22/2021 Fluoroscopy was utilized by the requesting physician.  No radiographic interpretation.   Medications: Infusions:  sodium chloride     anticoagulant sodium citrate     ferric gluconate (FERRLECIT) IVPB 250 mg (12/23/21 0901)    Scheduled Medications:  acetaminophen  650 mg Oral Q6H   carvedilol  6.25 mg Oral BID WC   Chlorhexidine Gluconate Cloth  6 each Topical Q0600   darbepoetin (ARANESP) injection - NON-DIALYSIS  200 mcg Subcutaneous Q Tue-1800   fentaNYL       ferric citrate  210 mg Oral TID WC   heparin  5,000 Units Subcutaneous Q8H   insulin aspart  0-9 Units Subcutaneous TID WC   rosuvastatin  15 mg Oral Daily   sodium chloride flush  3 mL Intravenous Q12H    have reviewed scheduled and prn medications.  Physical Exam: General:  NAD-   Heart: RRR-  new TDC Lungs: mostly clear Abdomen: soft, non tender Extremities: no edema Dialysis Access: left arm bandaged-  I cannot hear bruit thru bandage this AM but BP is also low    12/23/2021,9:54 AM  LOS: 4 days

## 2021-12-23 NOTE — Progress Notes (Signed)
Called to bedside to assess strikethrough. Patient in no distress. Hgb improved from 6.6 to 7.4. Good response to PRBC during first operation. Dressing taken down. No frank bleeding. Persistent ooze noted. Redressed.  Will give DDAVP. Re-dress as needed.   [name], MD [facility] Phone Number: [phone] 12/23/2021 5:00 AM
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
## 2021-12-23 NOTE — Care Management Important Message (Signed)
Important Message  Patient Details  Name: Jackie Wade MRN: [id number] Date of Birth: 22-Mar-1946   Medicare Important Message Given:  Yes     Shelda Altes 12/23/2021, 10:03 AM

## 2021-12-23 NOTE — Progress Notes (Signed)
Met with pt at bedside to discuss out-pt HD arrangements. Pt provided schedule letter with details noted. Information added to AVS as well. Contacted [facility] and spoke to [place], [place] manager, to discuss pt starting on Monday. Pt will need to be at the clinic at 8:30 on Monday to complete paperwork only. Pt will return to clinic on Tuesday to complete treatment. Pt will need to arrive at 5:30 for 5:50 chair time for Tuesday appt and MWF schedule. Pt will return Wednesday to start MWF schedule. Nephrologist made aware of these arrangements and agreeable. Pt agreeable to plan as well. Contacted renal NP regarding need for orders at clinic if pt d/c over the weekend. Will assist as needed.   Melven Sartorius Renal Navigator [phone]

## 2021-12-23 NOTE — Progress Notes (Signed)
Patient with bleeding from surgical site through the ace wrap.  No previous bleeding noted since approximately 0230.  Patient now has blood saturating through the ace wrap and onto the sheets.  MD notified.

## 2021-12-23 NOTE — Progress Notes (Signed)
Pt stated that her hand where the IV is hurts. Assessed pt's hand and it appears slightly swollen. Turned off saline until new IV started.

## 2021-12-23 NOTE — Plan of Care (Signed)
  Problem: Clinical Measurements: Goal: Respiratory complications will improve Outcome: Progressing Goal: Cardiovascular complication will be avoided Outcome: Progressing   Problem: Coping: Goal: Level of anxiety will decrease Outcome: Progressing   Problem: Safety: Goal: Ability to remain free from injury will improve Outcome: Progressing   

## 2021-12-23 NOTE — Progress Notes (Addendum)
[facility] [place] [place]  Subjective  - No more bleeding episodes since last dressing change this am at 4.      Assessment/Planning: POD # 1 I & D left UE hematoma left BB AV fistula  Good inflow with palpable radial pulse Edema improved, motor and sensation intact.   HGB stable 7.4 s/p 1 unit PRBC post anemia with chronic anemia Stable disposition without active bleeding.  We will have to allow healing and decreased edema prior to use for HD.   Compression wrap reapplied with ace wrap and elevation.     Objective (!) 144/34 69 97.7 F (36.5 C) (Oral) (!) 22 96%  Intake/Output Summary (Last 24 hours) at 12/23/2021 0805 Last data filed at 12/23/2021 0530 Gross per 24 hour  Intake 1761.5 ml  Output 1339 ml  Net 422.5 ml    Left UE N/V/M intact Edema with UE firm to palpation no evidence of phlegmasia with palpable radial pulse and motor intact.       Roxy Horseman 12/23/2021 8:05 AM --  Laboratory Lab Results: Recent Labs    12/22/21 0349 12/22/21 1712 12/23/21 0305  WBC 3.6*  --  6.9  HGB 6.6* 7.1* 7.4*  HCT 19.3* 20.3* 22.0*  PLT 183  --  163   BMET Recent Labs    12/22/21 0349 12/23/21 0305  NA 138 138  K 3.9 4.7  CL 109 107  CO2 18* 17*  GLUCOSE 120* 105*  BUN 90* 53*  CREATININE 6.33* 4.32*  CALCIUM 8.3* 8.3*    COAG Lab Results  Component Value Date   INR 1.1 04/13/2021   No results found for: PTT  VASCULAR STAFF ADDENDUM: I have independently interviewed and examined the patient. I agree with the above.  Arm looking better. Reviewed operative findings from last night. OK to mobilize. Coffee cup weight bearing to left arm. Use catheter for now. Follow up in 1-2 weeks for staple removal / wound check.  [name], MD [facility] Phone Number: [phone] 12/23/2021 2:39 PM

## 2021-12-23 NOTE — Assessment & Plan Note (Addendum)
Bleeding at the left upper extremity vascular access site. Developed hematoma that required incision and drainage of left arm brachiocephalic arteriovenous fistula.   Follow up hgb today is 5,5 with hct at 158 and plt at 120  Positive reactive thrombocytopenia. Scheduled to have 2 units PRBC today along with hemodialysis later in the day. Total PRBC this hospitalization #3 No clinical signs of ongoing bleeding. Will continue close follow up on hgb and hct in am and follow with vascular surgery recommendations.

## 2021-12-23 NOTE — Progress Notes (Signed)
Progress Note   Patient: Jackie Wade IRC:[id number] DOB: May 22, 1946 DOA: 12/19/2021     4 DOS: the patient was seen and examined on 12/23/2021   Brief hospital course: Jackie Wade was admitted to the hospital with the working diagnosis of volume overload in the setting of CKD stage 5 progression to ESRD.  76 yo female with the past medical history of CKD stage 5 (1st stage BVT 06/2021), HTN, T2DM, and chronic anemia who presented with dyspnea. Reported worsening dyspnea for the last 7 to 10 days associated with dyspnea. On her initial physical examination her blood pressure was 173/80, HR 88, RR 22 and oxygen saturation of 98%, ill looking appearing, with decreased breath sounds bilaterally, heart with S1 and S2 present with no gallops or rubs, abdomen soft and positive 2+ lower extremity edema.   Na 140, K 4,1, CL 114, bicarb 17, glucose 140, BUN 778 and cr 5,99 BNP 5,1025 Wbc 4,6. Hgb 7,7, hct 23, 3 and plt 207 SARS COVID 19 negative  Urine analysis with sg 1,008.    Chest radiograph with bilateral small pleural effusions, bilateral interstitial infiltrates.   EKG with 85 bpm, normal axis, normal intervals, sinus rhythm, with no significant ST segment or T wave changes.   Patient was placed on diuretic therapy for volume overload.  Nephrology was consulted with recommendations to initiate renal replacement therapy. Vascular consulted for 2nd stage BVT and tunneled HD catheter placement.  01/26 patient had temporary HD cathter placed on right IJ vein and second stage brachio basilic arteriovenous fistula creation.   Assessment and Plan * CKD (chronic kidney disease) stage 5, GFR less than 15 ml/min (HCC)- (present on admission) Patient has progressed to ESRD.  Patient had HD access placed yesterday and underwent renal replacement therapy. This am is feeling well with no chest pain or dyspnea, positive nausea but not vomiting.   Her bicarbonate continue to be low at 17, and K is  4,7.  Plan to continue renal replacement therapy per nephrology recommendations.   New onset of congestive heart failure (HCC) Hypervolemia mainly due to renal failure. Echocardiogram with preserved LV systolic function with EF 40 to 40% with global hypokinesis, RV systolic function preserved. Small pericardial effusion. Mild to moderate mitral valve regurgitation, tricuspid valve with moderate regurgitation.   Her volume has improved after renal replacement therapy with ultrafiltration.   Essential hypertension- (present on admission) Blood pressure had been better controlled now on hemodialysis  Continue with carvedilol.   Acute blood loss anemia Patient had bleeding at the left upper extremity vascular access site. Developed hematoma that required incision and drainage of left arm brachiocephalic arteriovenous fistula.   Today dressing in place, it has been changes by vascular surgery team. Follow up hgb is 7,4 and hct 22. Plan to follow up hgb and hct in am.   Anemia due to chronic kidney disease- (present on admission) Iron panel with iron 27, TIBC 246, transferrin saturation 11 and ferritin 285.  Patient is sp 1 unit PRBC transfusion  Continue with Iron and Epo supplementation.   Type 2 diabetes mellitus with hyperlipidemia (HCC) Her fasting glucose today is 105 Patient is tolerating po well, plan to continue with insulin sliding scale for glucose cover and monitoring   On statin therapy   Hyperlipidemia due to type 2 diabetes mellitus (HCC)-resolved as of 12/22/2021, (present on admission) Continue with statin therapy      Subjective: Patient with mild nausea but no dyspnea or chest pain,  Objective BP Marland Kitchen)  138/52 (BP Location: Right Arm)    Pulse 79    Temp 97.7 F (36.5 C) (Oral)    Resp 18    Ht 5\' 3"  (1.6 m)    Wt 57.1 kg    SpO2 96%    BMI 22.30 kg/m   Neurology awake and alert ENT positive pallor no icterus  Cardiovascular heart with S1 and S2 present and  rhythmic with no gallops or murmurs Pulmonary lungs with no wheezing or rales no rhonchi  Abdominal soft and non tender Skin Musculoskeletal   Data Reviewed:    Family Communication: no family at the bedside   Disposition: Status is: Inpatient  Remains inpatient appropriate because: new renal replacement therapy          Author: Tawni Millers, MD 12/23/2021 2:17 PM  For on call review [email or web address].

## 2021-12-23 NOTE — Progress Notes (Addendum)
Patient received from PACU.  Patient alert and oriented x4.  Unable to obtain oral temp.  Rectal temp obtained of 95.56F.  Verbal order for bear hugger given from MD.   Patient requests that no one be notified this late at night but she would like to update her brother in the morning.
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
## 2021-12-23 NOTE — Progress Notes (Signed)
Pt returned from hematoma I&D of AV fistula in OR. Unable to obtain oral temp. Rectal temp = 95.2. Warm blankets applied until able to get bair hugger. MD paged.

## 2021-12-24 LAB — BASIC METABOLIC PANEL
Anion gap: 13 (ref 5–15)
BUN: 58 mg/dL — ABNORMAL HIGH (ref 8–23)
CO2: 18 mmol/L — ABNORMAL LOW (ref 22–32)
Calcium: 7.7 mg/dL — ABNORMAL LOW (ref 8.9–10.3)
Chloride: 106 mmol/L (ref 98–111)
Creatinine, Ser: 5.52 mg/dL — ABNORMAL HIGH (ref 0.44–1.00)
GFR, Estimated: 8 mL/min — ABNORMAL LOW (ref >60)
Glucose, Bld: 90 mg/dL (ref 70–99)
Potassium: 3.9 mmol/L (ref 3.5–5.1)
Sodium: 137 mmol/L (ref 135–145)

## 2021-12-24 LAB — CBC
HCT: 15.8 % — ABNORMAL LOW (ref 36.0–46.0)
Hemoglobin: 5.5 g/dL — CL (ref 12.0–15.0)
MCH: 29.9 pg (ref 26.0–34.0)
MCHC: 34.8 g/dL (ref 30.0–36.0)
MCV: 85.9 fL (ref 80.0–100.0)
Platelets: 120 10*3/uL — ABNORMAL LOW (ref 150–400)
RBC: 1.84 MIL/uL — ABNORMAL LOW (ref 3.87–5.11)
RDW: 15.7 % — ABNORMAL HIGH (ref 11.5–15.5)
WBC: 8.1 10*3/uL (ref 4.0–10.5)
nRBC: 0.2 % (ref 0.0–0.2)

## 2021-12-24 LAB — GLUCOSE, CAPILLARY
Glucose-Capillary: 83 mg/dL (ref 70–99)
Glucose-Capillary: 118 mg/dL — ABNORMAL HIGH (ref 70–99)
Glucose-Capillary: 94 mg/dL (ref 70–99)
Glucose-Capillary: 184 mg/dL — ABNORMAL HIGH (ref 70–99)

## 2021-12-24 LAB — HEMOGLOBIN AND HEMATOCRIT, BLOOD
HCT: 25.6 % — ABNORMAL LOW (ref 36.0–46.0)
Hemoglobin: 8.6 g/dL — ABNORMAL LOW (ref 12.0–15.0)

## 2021-12-24 LAB — PREPARE RBC (CROSSMATCH)

## 2021-12-24 MED ORDER — FUROSEMIDE 10 MG/ML IJ SOLN
80.0000 mg | Freq: Once | INTRAMUSCULAR | Status: AC
  Administered 2021-12-24: 80 mg via INTRAVENOUS

## 2021-12-24 MED ORDER — FUROSEMIDE 10 MG/ML IJ SOLN
20.0000 mg | Freq: Once | INTRAMUSCULAR | Status: AC
  Administered 2021-12-24: 20 mg via INTRAVENOUS
  Filled 2021-12-24: qty 2

## 2021-12-24 MED ORDER — HEPARIN SODIUM (PORCINE) 1000 UNIT/ML IJ SOLN
INTRAMUSCULAR | Status: AC
  Filled 2021-12-24: qty 3

## 2021-12-24 MED ORDER — ACETAMINOPHEN 325 MG PO TABS
650.0000 mg | ORAL_TABLET | Freq: Once | ORAL | Status: AC
  Administered 2021-12-24: 650 mg via ORAL

## 2021-12-24 MED ORDER — DIPHENHYDRAMINE HCL 25 MG PO CAPS
25.0000 mg | ORAL_CAPSULE | Freq: Once | ORAL | Status: AC
  Administered 2021-12-24: 25 mg via ORAL
  Filled 2021-12-24: qty 1

## 2021-12-24 MED ORDER — SODIUM CHLORIDE 0.9% IV SOLUTION: Freq: Once | INTRAVENOUS | Status: AC

## 2021-12-24 MED ORDER — FUROSEMIDE 10 MG/ML IJ SOLN
INTRAMUSCULAR | Status: AC
  Filled 2021-12-24: qty 8

## 2021-12-24 NOTE — Progress Notes (Signed)
First unit of blood completed. VSS, patient without complain.  Needs addressed.

## 2021-12-24 NOTE — Progress Notes (Signed)
°  Progress Note    12/24/2021 9:21 AM 2 Days Post-Op  Subjective: Pain left upper extremity well controlled  Vitals:   12/24/21 0800 12/24/21 0822  BP: 133/65 129/60  Pulse: 82   Resp:    Temp: 97.9 F (36.6 C) 97.7 F (36.5 C)  SpO2: 98%     Physical Exam: Awake alert oriented Left upper extremity staples intact with minimal oozing Hand is warm and well-perfused   CBC    Component Value Date/Time   WBC 8.1 12/24/2021 0316   RBC 1.84 (L) 12/24/2021 0316   HGB 5.5 (LL) 12/24/2021 0316   HCT 15.8 (L) 12/24/2021 0316   PLT 120 (L) 12/24/2021 0316   MCV 85.9 12/24/2021 0316   MCH 29.9 12/24/2021 0316   MCHC 34.8 12/24/2021 0316   RDW 15.7 (H) 12/24/2021 0316   LYMPHSABS 0.8 12/19/2021 1358   MONOABS 0.3 12/19/2021 1358   EOSABS 0.1 12/19/2021 1358   BASOSABS 0.0 12/19/2021 1358    BMET    Component Value Date/Time   NA 137 12/24/2021 0316   K 3.9 12/24/2021 0316   CL 106 12/24/2021 0316   CO2 18 (L) 12/24/2021 0316   GLUCOSE 90 12/24/2021 0316   BUN 58 (H) 12/24/2021 0316   CREATININE 5.52 (H) 12/24/2021 0316   CALCIUM 7.7 (L) 12/24/2021 0316   GFRNONAA 8 (L) 12/24/2021 0316   GFRAA 57 (L) 09/09/2015 1319    INR    Component Value Date/Time   INR 1.1 04/13/2021 0641     Intake/Output Summary (Last 24 hours) at 12/24/2021 0921 Last data filed at 12/24/2021 0757 Gross per 24 hour  Intake 1456.34 ml  Output 250 ml  Net 1206.34 ml     Assessment/plan:  76 y.o. female is status post second stage basilic vein fistula with takeback for hematoma.  There is minimal hematoma and drainage of the left arm.  Pain is satisfactory at this time.  She is okay for discharge from a vascular standpoint we will follow-up in our office in 1 to 2 weeks for wound check     Dennie Vecchio C. Donzetta Matters, MD [facility] Office: [phone] Pager: [phone]  12/24/2021 9:21 AM

## 2021-12-24 NOTE — Progress Notes (Addendum)
Progress Note   Patient: Jackie Wade VQM:[id number] DOB: 04-16-1946 DOA: 12/19/2021     5 DOS: the patient was seen and examined on 12/24/2021   Brief hospital course: Jackie Wade was admitted to the hospital with the working diagnosis of volume overload in the setting of CKD stage 5 progression to ESRD.  76 yo female with the past medical history of CKD stage 5 (1st stage BVT 06/2021), HTN, T2DM, and chronic anemia who presented with dyspnea. Reported worsening dyspnea for the last 7 to 10 days associated with dyspnea. On her initial physical examination her blood pressure was 173/80, HR 88, RR 22 and oxygen saturation of 98%, ill looking appearing, with decreased breath sounds bilaterally, heart with S1 and S2 present with no gallops or rubs, abdomen soft and positive 2+ lower extremity edema.   Na 140, K 4,1, CL 114, bicarb 17, glucose 140, BUN 778 and cr 5,99 BNP 9,3267 Wbc 4,6. Hgb 7,7, hct 23, 3 and plt 207 SARS COVID 19 negative  Urine analysis with sg 1,008.    Chest radiograph with bilateral small pleural effusions, bilateral interstitial infiltrates.   EKG with 85 bpm, normal axis, normal intervals, sinus rhythm, with no significant ST segment or T wave changes.   Patient was placed on diuretic therapy for volume overload.  Nephrology was consulted with recommendations to initiate renal replacement therapy. Vascular consulted for 2nd stage BVT and tunneled HD catheter placement.  01/26 patient had temporary HD cathter placed on right IJ vein and second stage brachio basilic arteriovenous fistula creation.   Tolerated renal replacement therapy well.   01/26 Bleeding at the left upper extremity vascular access site. Developed hematoma that required incision and drainage of left arm brachiocephalic arteriovenous fistula.   Developed acute blood loss anemia requiring PRBC transfusion.  01/28 hemodialysis #2  Plan to discharge home on 01/29 if continue to be stable.      Assessment and Plan * CKD (chronic kidney disease) stage 5, GFR less than 15 ml/min (HCC)- (present on admission) Patient has progressed to ESRD.  Patient clinically euvolemic at the physical examination, she has no dyspnea or chest pain. Plan for renal replacement therapy today per nephrology recommendations.  Patient already has a outpatient HD unit, plan for possible discharge home in am.   New onset of congestive heart failure ([place]) Hypervolemia mainly due to renal failure. Echocardiogram with preserved LV systolic function with EF 40 to 40% with global hypokinesis, RV systolic function preserved. Small pericardial effusion. Mild to moderate mitral valve regurgitation, tricuspid valve with moderate regurgitation.   Clinically euvolemic.  Continue fluid management through hemodialysis and ultrafiltration Continue blood pressure monitoring.  Medical therapy with carvedilol.   Essential hypertension- (present on admission) Continue blood pressure control with carvedilol.  Systolic blood pressure has been 100 to 130's  Acute blood loss anemia Bleeding at the left upper extremity vascular access site. Developed hematoma that required incision and drainage of left arm brachiocephalic arteriovenous fistula.   Follow up hgb today is 5,5 with hct at 158 and plt at 120  Positive reactive thrombocytopenia. Scheduled to have 2 units PRBC today along with hemodialysis later in the day. Total PRBC this hospitalization #3 No clinical signs of ongoing bleeding. Will continue close follow up on hgb and hct in am and follow with vascular surgery recommendations.   Anemia due to chronic kidney disease- (present on admission) Iron panel with iron 27, TIBC 246, transferrin saturation 11 and ferritin 285.   Continue with Iron  and Epo supplementation.   Type 2 diabetes mellitus with hyperlipidemia ([place]) Patient is tolerating po well, plan to continue with insulin sliding scale for glucose  cover and monitoring  Fasting glucose today 90  Continue with statin therapy   Hyperlipidemia due to type 2 diabetes mellitus (HCC)-resolved as of 12/22/2021, (present on admission) Continue with statin therapy      Subjective: Patient is feeling better, last night with poor sleep, no significant left arm pain, no nausea or vomiting.   Objective BP 129/60    Pulse 82    Temp 97.7 F (36.5 C) (Oral)    Resp 17    Ht 5\' 3"  (1.6 m)    Wt 58.7 kg    SpO2 98%    BMI 22.92 kg/m   Neurology awake and alert ENT positive pallor Cardiovascular heart with S1 and S2 present and rhythmic, with no gallops or rubs Pulmonary lungs with no wheezing or rhonchi, no rales  Abdominal soft and non tender Skin left upper extremity with dressing in place, no local edema or tenderness.  Musculoskeletal   Data Reviewed:    Family Communication: no family at the bedside.   Disposition: Status is: Inpatient  Remains inpatient appropriate because: renal replacement therapy          Author: Tawni Millers, MD 12/24/2021 11:09 AM  For on call review [email or web address].

## 2021-12-24 NOTE — Progress Notes (Addendum)
Pt received back from HD short of breath, states with chest tightness.  Remains alert and oriented.  Lung sounds congested with fine crackles.  Dr. Cathlean Sauer made aware, with new order for IV lasix 80 mg placed.  Pt needs addressed.

## 2021-12-24 NOTE — Progress Notes (Signed)
Lasix 80 mg IV given as ordered.  Patient voided 300 ml, able to walk to the bathroom.  Patient verbalized "I am feeling a little bit better".  Needs addressed.

## 2021-12-24 NOTE — Progress Notes (Signed)
Dressing for R upper arm fistula was found to have scant amount of sanguinous fluid. This RN changed the dressing with ABD pad, kerlex and ACE wrap.

## 2021-12-24 NOTE — Progress Notes (Signed)
°   12/24/21 1838  Assess: MEWS Score  Temp (!) 97.4 F (36.3 C)  BP (!) 179/88  Pulse Rate 88  Resp (!) 26  Level of Consciousness Alert  SpO2 99 %  O2 Device Nasal Cannula  O2 Flow Rate (L/min) 3 L/min  Assess: MEWS Score  MEWS Temp 0  MEWS Systolic 0  MEWS Pulse 0  MEWS RR 2  MEWS LOC 0  MEWS Score 2  MEWS Score Color Yellow  Assess: if the MEWS score is Yellow or Red  Were vital signs taken at a resting state? Yes  Focused Assessment Change from prior assessment (see assessment flowsheet)  Early Detection of Sepsis Score *See Row Information* Low  MEWS guidelines implemented *See Row Information* Yes  Treat  MEWS Interventions Administered prn meds/treatments  Pain Scale 0-10  Pain Score 0  Escalate  MEWS: Escalate Yellow: discuss with charge nurse/RN and consider discussing with provider and RRT  Notify: Charge Nurse/RN  Name of Charge Nurse/RN Notified Beverlee Nims RN  Date Charge Nurse/RN Notified 12/24/21  Time Charge Nurse/RN Notified 1838  Notify: Provider  Provider Name/Title Dr. Cathlean Sauer  Date Provider Notified 12/24/21  Time Provider Notified 1818  Notification Type Page  Notification Reason Change in status;Other (Comment) (yellow MEWS)  Provider response See new orders  Date of Provider Response 12/24/21  Time of Provider Response 1820  Notify: Rapid Response  Name of Rapid Response RN Notified not applicable  Document  Patient Outcome Stabilized after interventions  Progress note created (see row info) Yes   Patient stated feeling better after lasix 80 mg IV administered.  Kept O2 at 3L [place], saturation at 96-98%, NSR on the monitor, no distress.  Dr. Cathlean Sauer aware of current condition, will continue to monitor.  Assisted in position of comfort in bed, patient's needs addressed.

## 2021-12-24 NOTE — Progress Notes (Signed)
Notified by RN that Hgb is 5.5 this am.  No complaints by patient  Transfuse 2 units of PRBC. Ordered placed.  Monitor Hgb/Hct 2 hr after transfusion

## 2021-12-24 NOTE — Progress Notes (Signed)
Subjective:  Seen- hgb was 5.5 this AM so 2 units of blood ordered-  frustrated -  feeling nauseated and weak but up with glasses on looking at phone-  due for HD later today     Objective Vital signs in last 24 hours: Vitals:   12/24/21 0739 12/24/21 0757 12/24/21 0800 12/24/21 0822  BP: (!) 129/59 126/78 133/65 129/60  Pulse:  80 82   Resp: 16 17    Temp: 98.4 F (36.9 C)  97.9 F (36.6 C) 97.7 F (36.5 C)  TempSrc: Oral  Oral Oral  SpO2: 98% 95% 98%   Weight:      Height:       Weight change: -0.304 kg  Intake/Output Summary (Last 24 hours) at 12/24/2021 0834 Last data filed at 12/24/2021 0757 Gross per 24 hour  Intake 1456.34 ml  Output 250 ml  Net 1206.34 ml    Assessment/Plan: 76 year old black female with advanced CKD now with symptoms of volume overload and uremia 1.Renal- advanced CKD with uremic symptoms, low albumin, volume overload as well as anemia-  for initiation of dialysis this hosp.  Agrees to the second stage of her BVT and tunneled dialysis catheter placement -   done 9/44 but with complication of bleeding and hematoma evac.  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  My plan was for her to get HD today and go home but would feel better if watched overnight so can check hgb in AM to make sure no more blood is needed 2. Hypertension/volume  -was overloaded and having symptoms-did respond very well to Lasix -  kept making good urine after lasix stopped yesterday-  then orthostatic-  was 830 positive yesterday and looking to be positive volume status again today which is fine-  no fluid removal with HD 3. Anemia  -this is significant and likely contributing to her symptoms.  I gave ESA and  iron stores are low, will replete-then with hgb 6.6-  given one unit-  5.5 this AM-  for another 2 units-  watch hgb next 24 hours-  no heparin with HD 4. Bones- PTH 159  -  phos is high-  have started Kristen Loader    Labs: Basic Metabolic Panel: Recent Labs  Lab 12/21/21 0405 12/22/21 0349 12/23/21 0305 12/24/21 0316  NA 138 138 138 137  K 4.0 3.9 4.7 3.9  CL 111 109 107 106  CO2 15* 18* 17* 18*  GLUCOSE 81 120* 105* 90  BUN 85* 90* 53* 58*  CREATININE 6.19* 6.33* 4.32* 5.52*  CALCIUM 8.5* 8.3* 8.3* 7.7*  PHOS 5.8*  --   --   --    Liver Function Tests: Recent Labs  Lab 12/21/21 0405  ALBUMIN 2.6*   No results for input(s): LIPASE, AMYLASE in the last 168 hours. No results for input(s): AMMONIA in the last 168 hours. CBC: Recent Labs  Lab 12/19/21 1358 12/19/21 1443 12/22/21 0349 12/22/21 1712 12/23/21 0305 12/24/21 0316  WBC 4.6  --  3.6*  --  6.9 8.1  NEUTROABS 3.3  --   --   --   --   --   HGB 7.7*   < > 6.6* 7.1* 7.4* 5.5*  HCT 23.3*   < > 19.3* 20.3* 22.0* 15.8*  MCV 87.3  --  85.0  --  85.6 85.9  PLT 207  --  183  --  163 120*   < > = values in this interval not displayed.   Cardiac Enzymes: No results for input(s): CKTOTAL, CKMB, CKMBINDEX, TROPONINI in the last 168 hours. CBG: Recent Labs  Lab 12/23/21 0608 12/23/21 1137 12/23/21 1626 12/23/21 2120 12/24/21 0637  GLUCAP 112* 164* 135* 104* 83    Iron Studies:  No results for input(s): IRON, TIBC, TRANSFERRIN, FERRITIN in the last 72 hours.  Studies/Results: DG CHEST PORT 1 VIEW  Result Date: 12/22/2021 CLINICAL DATA:  Shortness of breath EXAM: PORTABLE CHEST 1 VIEW COMPARISON:  12/19/2021 FINDINGS: Transverse diameter of heart is increased. Central pulmonary vessels are prominent. Increased density is seen in the left lower lung fields obscuring left hemidiaphragm. There is blunting of both lateral CP angles. There is improvement in aeration of right lower lung fields. There is interval placement of right IJ dialysis catheter with its tip in superior vena cava close to the right atrium. IMPRESSION: Cardiomegaly. Central pulmonary  vessels are prominent without signs of alveolar pulmonary edema. Increased density in the left lower lung fields obscuring the left hemidiaphragm suggests pleural effusion and possibly underlying atelectasis/pneumonia. There is interval improvement in aeration of right lower lung fields. Blunting of right lateral CP angle suggests small right pleural effusion. Electronically Signed   By: Elmer Picker M.D.   On: 12/22/2021 15:16   DG C-Arm 1-60 Min-No Report  Result Date: 12/22/2021 Fluoroscopy was utilized by the requesting physician.  No radiographic interpretation.   Medications: Infusions:  sodium chloride     anticoagulant sodium citrate     ferric gluconate (FERRLECIT) IVPB 250 mg (12/23/21 0901)    Scheduled Medications:  acetaminophen  650 mg Oral Q6H   carvedilol  6.25 mg Oral BID WC   Chlorhexidine Gluconate Cloth  6 each Topical Q0600   darbepoetin (ARANESP) injection - NON-DIALYSIS  200 mcg Subcutaneous Q Tue-1800   ferric citrate  210 mg Oral TID WC   heparin  5,000 Units Subcutaneous Q8H   insulin aspart  0-9 Units Subcutaneous TID WC   rosuvastatin  15 mg Oral Daily   sodium chloride flush  3 mL Intravenous Q12H    have reviewed scheduled and prn medications.  Physical Exam: General:  NAD-   Heart: RRR-  new TDC Lungs: mostly clear Abdomen: soft, non tender Extremities: no edema Dialysis Access: left arm bandaged-  I still cannot hear bruit thru bandage this AM    12/24/2021,8:34 AM  LOS: 5 days

## 2021-12-25 LAB — TYPE AND SCREEN
ABO/RH(D): O POS
Antibody Screen: NEGATIVE
Unit division: 0
Unit division: 0
Unit division: 0

## 2021-12-25 LAB — BPAM RBC
Blood Product Expiration Date: 202302212359
Blood Product Expiration Date: 202303012359
Blood Product Expiration Date: 202303012359
ISSUE DATE / TIME: 202301261103
ISSUE DATE / TIME: 202301280452
ISSUE DATE / TIME: 202301280756
Unit Type and Rh: 5100
Unit Type and Rh: 5100
Unit Type and Rh: 5100

## 2021-12-25 LAB — GLUCOSE, CAPILLARY
Glucose-Capillary: 80 mg/dL (ref 70–99)
Glucose-Capillary: 109 mg/dL — ABNORMAL HIGH (ref 70–99)

## 2021-12-25 LAB — HEMOGLOBIN AND HEMATOCRIT, BLOOD
HCT: 26.6 % — ABNORMAL LOW (ref 36.0–46.0)
Hemoglobin: 8.9 g/dL — ABNORMAL LOW (ref 12.0–15.0)

## 2021-12-25 MED ORDER — ACETAMINOPHEN 325 MG PO TABS: 650.0000 mg | ORAL_TABLET | ORAL | Status: AC | PRN

## 2021-12-25 MED ORDER — FUROSEMIDE 40 MG PO TABS: ORAL_TABLET | ORAL | 0 refills | Status: DC

## 2021-12-25 MED ORDER — FUROSEMIDE 10 MG/ML IJ SOLN
80.0000 mg | Freq: Once | INTRAMUSCULAR | Status: AC
  Administered 2021-12-25: 80 mg via INTRAVENOUS
  Filled 2021-12-25: qty 8

## 2021-12-25 MED ORDER — CARVEDILOL 6.25 MG PO TABS: 6.2500 mg | ORAL_TABLET | Freq: Two times a day (BID) | ORAL | 0 refills | Status: AC

## 2021-12-25 MED ORDER — BISACODYL 5 MG PO TBEC
10.0000 mg | DELAYED_RELEASE_TABLET | Freq: Once | ORAL | Status: DC
  Filled 2021-12-25: qty 2

## 2021-12-25 MED ORDER — FUROSEMIDE 40 MG PO TABS
40.0000 mg | ORAL_TABLET | Freq: Every day | ORAL | Status: DC
  Administered 2021-12-25: 40 mg via ORAL
  Filled 2021-12-25: qty 1

## 2021-12-25 NOTE — Discharge Summary (Signed)
Physician Discharge Summary  Taleigha Pinson NAT:[id number] DOB: 06/04/1946 DOA: 12/19/2021  PCP: Lucianne Lei, MD  Admit date: 12/19/2021 Discharge date: 12/25/2021  Admitted From: home  Disposition:  Home   Recommendations for Outpatient Follow-up and new medication changes:  Follow up with [name] in 7 to 10 days.  Continue with renal replacement therapy as outpatient.  Continue diuresis with furosemide   Home Health: no   Equipment/Devices: no    Discharge Condition: stable  CODE STATUS: full  Diet recommendation:  heart healthy renal prudent diet.   Brief/Interim Summary: Mrs. Bevacqua was admitted to the hospital with the working diagnosis of volume overload in the setting of CKD stage 5 progression to ESRD.   76 yo female with the past medical history of CKD stage 5 (1st stage BVT 06/2021), HTN, T2DM, and chronic anemia who presented with dyspnea. Reported worsening dyspnea for the last 7 to 10 days associated with dyspnea. On her initial physical examination her blood pressure was 173/80, HR 88, RR 22 and oxygen saturation of 98%, ill looking appearing, with decreased breath sounds bilaterally, heart with S1 and S2 present with no gallops or rubs, abdomen soft and positive 2+ lower extremity edema.    Na 140, K 4,1, CL 114, bicarb 17, glucose 140, BUN 778 and cr 5,99 BNP 4,2706 Wbc 4,6. Hgb 7,7, hct 23, 3 and plt 207 SARS COVID 19 negative   Urine analysis with sg 1,008.     Chest radiograph with bilateral small pleural effusions, bilateral interstitial infiltrates.    EKG with 85 bpm, normal axis, normal intervals, sinus rhythm, with no significant ST segment or T wave changes.    Patient was placed on diuretic therapy for volume overload.  Nephrology was consulted with recommendations to initiate renal replacement therapy. Vascular consulted for 2nd stage BVT and tunneled HD catheter placement.   01/26 patient had temporary HD cathter placed on right IJ vein and second  stage brachio basilic arteriovenous fistula creation.    Tolerated renal replacement therapy well.    01/26 Bleeding at the left upper extremity vascular access site. Developed hematoma that required incision and drainage of left arm brachiocephalic arteriovenous fistula.    Developed acute blood loss anemia requiring PRBC transfusion.  01/28 hemodialysis #2  Plan will continue taking furosemide at home and will follow up with renal replacement therapy as outpatient.    CKD (chronic kidney disease) stage 5, GFR less than 15 ml/min (HCC)- (present on admission) Patient did received furosemide for diuresis with good response.  Patient has been started on renal replacement therapy with good toleration. She has a tunneled cathter for HD and now completed 2nd stage of her left upper extremity fistula.   Her volume has improved, her discharge K is 3.9 with serum bicarbonate at 18.  Patient will continue taking furosemide to keep euvolemic and plan to follow up as outpatient.    2. New onset of congestive heart failure ([place]) Decompensation likely related to renal failure and hypervolemia.   Further workup with echocardiogram with preserved LV systolic function with EF 40 to 40% with global hypokinesis, RV systolic function preserved. Small pericardial effusion. Mild to moderate mitral valve regurgitation, tricuspid valve with moderate regurgitation.    Patient will continue carvedilol and furosemide.  Renal replacement therapy to maintain volume status.    3. Essential hypertension- (present on admission) Blood pressure control with carvedilol.    4. Acute blood loss anemia/ reactive thrombocytopenia.  Patient required 3 units PRBC transfusion  during her hospitalization. She did have bleeding at the left upper extremity vascular access site, developing hematoma that required incision and drainage of left arm brachiocephalic arteriovenous fistula.    Her follow up hgb is 8,9 and hct at 26,6.   Patient will follow up with vascular surgery for further dressing changes. Wound check with vascular surgery in 1 to 2 weeks.    5. Anemia due to chronic kidney disease- (present on admission) Iron panel with iron 27, TIBC 246, transferrin saturation 11 and ferritin 285.    Patient was placed on iron and Epo supplementation.    6. Type 2 diabetes mellitus with hyperlipidemia ([place]) Her glucose remained well controlled during her hospitalization, she was placed on insulin sliding scale for glucose cover and monitoring.  Continue with statin therapy.  At discharge patient will resume [place]    Discharge Diagnoses:  Principal Problem:   CKD (chronic kidney disease) stage 5, GFR less than 15 ml/min (HCC) Active Problems:   New onset of congestive heart failure (HCC)   Essential hypertension   Acute blood loss anemia   Anemia due to chronic kidney disease   Type 2 diabetes mellitus with hyperlipidemia ([place])    Discharge Instructions   Allergies as of 12/25/2021   No Known Allergies      Medication List     STOP taking these medications    metolazone 5 MG tablet Commonly known as: ZAROXOLYN   torsemide 20 MG tablet Commonly known as: DEMADEX       TAKE these medications    acetaminophen 325 MG tablet Commonly known as: TYLENOL Take 2 tablets (650 mg total) by mouth every 4 (four) hours as needed for headache or mild pain.   carvedilol 6.25 MG tablet Commonly known as: COREG Take 1 tablet (6.25 mg total) by mouth 2 (two) times daily with a meal.   furosemide 40 MG tablet Commonly known as: LASIX Please take on non dialysis days.   Januvia 100 MG tablet Generic drug: sitaGLIPtin Take 100 mg by mouth daily.   rosuvastatin 5 MG tablet Commonly known as: CRESTOR Take 1 tablet (5 mg total) by mouth daily. What changed: how much to take   sevelamer carbonate 800 MG tablet Commonly known as: RENVELA Take 800 mg by mouth 3 (three) times daily.                Discharge Care Instructions  (From admission, onward)           Start     Ordered   12/25/21 0000  Discharge wound care:       Comments: Keep dressing in place until follow up with primary care.   12/25/21 [place], [facility] Follow up.   Why: Schedule is Monday/Wednesday/Friday. Patient will need to go to clinic on Monday at 8:30 to complete paperwork only. Patient will return to clinic on Tuesday to complete treatment. Pt needs to arrive at 5:30 for 5:50 chair time on Tuesday and for MWF schedule. Contact information: [place] [address] [phone]                No Known Allergies  Consultations: Nephrology    Procedures/Studies: DG Chest 2 View  Result Date: 12/19/2021 CLINICAL DATA:  Shortness of breath in a 76 year old female soft. EXAM: CHEST - 2 VIEW COMPARISON:  July 01, 2021. FINDINGS: Cardiomediastinal  contours are partially obscured by airspace disease and effusions at the lung bases. Contours grossly stable in unobscured portions compared to previous imaging in this patient with cardiomegaly noted on prior imaging. Obscured LEFT and RIGHT hemidiaphragm. Lungs are otherwise clear with mild increased interstitial prominence. Signs of osteopenia. No acute musculoskeletal finding to the extent evaluated. IMPRESSION: 1. Bilateral effusions and basilar airspace disease likely related to heart failure with basilar volume loss. 2. Increased interstitial markings albeit mild may reflect mild edema. Correlate with evidence of CHF. Electronically Signed   By: Zetta Bills M.D.   On: 12/19/2021 14:35   DG CHEST PORT 1 VIEW  Result Date: 12/22/2021 CLINICAL DATA:  Shortness of breath EXAM: PORTABLE CHEST 1 VIEW COMPARISON:  12/19/2021 FINDINGS: Transverse diameter of heart is increased. Central pulmonary vessels are prominent. Increased density is seen in the left lower  lung fields obscuring left hemidiaphragm. There is blunting of both lateral CP angles. There is improvement in aeration of right lower lung fields. There is interval placement of right IJ dialysis catheter with its tip in superior vena cava close to the right atrium. IMPRESSION: Cardiomegaly. Central pulmonary vessels are prominent without signs of alveolar pulmonary edema. Increased density in the left lower lung fields obscuring the left hemidiaphragm suggests pleural effusion and possibly underlying atelectasis/pneumonia. There is interval improvement in aeration of right lower lung fields. Blunting of right lateral CP angle suggests small right pleural effusion. Electronically Signed   By: Elmer Picker M.D.   On: 12/22/2021 15:16   DG C-Arm 1-60 Min-No Report  Result Date: 12/22/2021 Fluoroscopy was utilized by the requesting physician.  No radiographic interpretation.   ECHOCARDIOGRAM COMPLETE  Result Date: 12/20/2021    ECHOCARDIOGRAM REPORT   Patient Name:   SEAIRRA OTANI Date of Exam: 12/20/2021 Medical Rec #:  [id number]       Height:       63.0 in Accession #:    [id number]      Weight:       141.0 lb Date of Birth:  [birth year]       BSA:          1.667 m Patient Age:    11 years        BP:           132/67 mmHg Patient Gender: F               HR:           77 bpm. Exam Location:  Inpatient Procedure: 2D Echo Indications:    CHF  History:        Patient has no prior history of Echocardiogram examinations.                 Risk Factors:Hypertension and Diabetes.  Sonographer:    Jefferey Pica Referring Phys: [place]  1. Left ventricular ejection fraction, by estimation, is 40 to 45%. The left ventricle has mildly decreased function. The left ventricle demonstrates global hypokinesis. There is severe concentric left ventricular hypertrophy. Left ventricular diastolic  parameters are consistent with Grade II diastolic dysfunction (pseudonormalization).  2. Right  ventricular systolic function is normal. The right ventricular size is normal. Mildly increased right ventricular wall thickness. There is moderately elevated pulmonary artery systolic pressure. The estimated right ventricular systolic pressure  is 66.2 mmHg.  3. Left atrial size was moderately dilated.  4. A small pericardial effusion is present. The pericardial effusion is circumferential. There is no evidence  of cardiac tamponade. Large pleural effusion in the left lateral region.  5. Mild to moderate mitral valve regurgitation and eccentric.  6. Tricuspid valve regurgitation is mild to moderate.  7. The aortic valve is tricuspid. Aortic valve regurgitation is not visualized.  8. The inferior vena cava is dilated in size with <50% respiratory variability, suggesting right atrial pressure of 15 mmHg. Comparison(s): No prior Echocardiogram. Conclusion(s)/Recommendation(s): Notable biventricular hypertrophy, atrial enlargement, and pericardial effusion. Outpatient pyrophosphate imaging for amyloidosis may be reasonable if clinically indicated. FINDINGS  Left Ventricle: Left ventricular ejection fraction, by estimation, is 40 to 45%. The left ventricle has mildly decreased function. The left ventricle demonstrates global hypokinesis. The left ventricular internal cavity size was normal in size. There is  severe concentric left ventricular hypertrophy. Left ventricular diastolic parameters are consistent with Grade II diastolic dysfunction (pseudonormalization). Right Ventricle: The right ventricular size is normal. Mildly increased right ventricular wall thickness. Right ventricular systolic function is normal. There is moderately elevated pulmonary artery systolic pressure. The tricuspid regurgitant velocity is 2.90 m/s, and with an assumed right atrial pressure of 15 mmHg, the estimated right ventricular systolic pressure is 89.2 mmHg. Left Atrium: Left atrial size was moderately dilated. Right Atrium: Right atrial  size was normal in size. Pericardium: A small pericardial effusion is present. The pericardial effusion is circumferential. There is no evidence of cardiac tamponade. Mitral Valve: The mitral valve is normal in structure. Mild to moderate mitral valve regurgitation. Tricuspid Valve: The tricuspid valve is normal in structure. Tricuspid valve regurgitation is mild to moderate. No evidence of tricuspid stenosis. Aortic Valve: The aortic valve is tricuspid. Aortic valve regurgitation is not visualized. Aortic valve peak gradient measures 9.1 mmHg. Pulmonic Valve: The pulmonic valve was normal in structure. Pulmonic valve regurgitation is mild. No evidence of pulmonic stenosis. Aorta: The aortic root and ascending aorta are structurally normal, with no evidence of dilitation. Venous: The inferior vena cava is dilated in size with less than 50% respiratory variability, suggesting right atrial pressure of 15 mmHg. IAS/Shunts: The interatrial septum was not well visualized. Additional Comments: There is a large pleural effusion in the left lateral region.  LEFT VENTRICLE PLAX 2D LVIDd:         5.40 cm      Diastology LVIDs:         4.00 cm      LV e' medial:    3.57 cm/s LV PW:         1.50 cm      LV E/e' medial:  31.1 LV IVS:        1.60 cm      LV e' lateral:   5.15 cm/s LVOT diam:     2.00 cm      LV E/e' lateral: 21.6 LV SV:         83 LV SV Index:   50 LVOT Area:     3.14 cm  LV Volumes (MOD) LV vol d, MOD A4C: 117.0 ml LV vol s, MOD A4C: 74.6 ml LV SV MOD A4C:     117.0 ml RIGHT VENTRICLE             IVC RV Basal diam:  2.80 cm     IVC diam: 2.60 cm RV S prime:     11.50 cm/s TAPSE (M-mode): 2.3 cm LEFT ATRIUM             Index        RIGHT ATRIUM  Index LA diam:        4.10 cm 2.46 cm/m   RA Area:     16.90 cm LA Vol (A2C):   82.4 ml 49.44 ml/m  RA Volume:   48.10 ml  28.86 ml/m LA Vol (A4C):   64.2 ml 38.52 ml/m LA Biplane Vol: 76.6 ml 45.96 ml/m  AORTIC VALVE                 PULMONIC VALVE AV Area  (Vmax): 2.43 cm     PV Vmax:       0.78 m/s AV Vmax:        151.00 cm/s  PV Peak grad:  2.5 mmHg AV Peak Grad:   9.1 mmHg LVOT Vmax:      117.00 cm/s LVOT Vmean:     76.100 cm/s LVOT VTI:       0.265 m  AORTA Ao Root diam: 3.30 cm Ao Asc diam:  3.20 cm MITRAL VALVE                TRICUSPID VALVE MV Area (PHT): 4.26 cm     TR Peak grad:   33.6 mmHg MV Decel Time: 178 msec     TR Vmax:        290.00 cm/s MV E velocity: 111.00 cm/s MV A velocity: 123.00 cm/s  SHUNTS MV E/A ratio:  0.90         Systemic VTI:  0.26 m                             Systemic Diam: 2.00 cm Rudean Haskell MD Electronically signed by Rudean Haskell MD Signature Date/Time: 12/20/2021/1:22:08 PM    Final      Procedures: right IJ HD cathter, 2nd stage left upper extremity fistula   Subjective: Patient is feeling better, positive weakness but improved, no nausea or vomiting and no further dyspnea.   Discharge Exam: Vitals:   12/25/21 0410 12/25/21 0907  BP: (!) 145/74 (!) 151/77  Pulse:  76  Resp:    Temp: 98.4 F (36.9 C)   SpO2: 94%    Vitals:   12/24/21 1842 12/24/21 1955 12/25/21 0410 12/25/21 0907  BP: (!) 174/85 135/68 (!) 145/74 (!) 151/77  Pulse:  75  76  Resp:  20    Temp:  97.7 F (36.5 C) 98.4 F (36.9 C)   TempSrc:  Oral Oral   SpO2:  96% 94%   Weight:   59.1 kg   Height:        General: Not in pain or dyspnea  Neurology: Awake and alert, non focal  E ENT: mild pallor, no icterus, oral mucosa moist Cardiovascular: No JVD. S1-S2 present, rhythmic, no gallops, rubs, or murmurs. No lower extremity edema. Left upper extremity with non pitting edema, dressing in place.  Pulmonary:  positive  breath sounds bilaterally, with no wheezing, rhonchi or rales. Gastrointestinal. Abdomen soft and non tender Skin. No rashes Musculoskeletal: no joint deformities   The results of significant diagnostics from this hospitalization (including imaging, microbiology, ancillary and laboratory) are listed  below for reference.     Microbiology: Recent Results (from the past 240 hour(s))  Resp Panel by RT-PCR (Flu A&B, Covid) Nasopharyngeal Swab     Status: None   Collection Time: 12/19/21  1:59 PM   Specimen: Nasopharyngeal Swab; Nasopharyngeal(NP) swabs in vial transport medium  Result Value Ref Range Status   SARS Coronavirus 2 by  RT PCR NEGATIVE NEGATIVE Final    Comment: (NOTE) SARS-CoV-2 target nucleic acids are NOT DETECTED.  The SARS-CoV-2 RNA is generally detectable in upper respiratory specimens during the acute phase of infection. The lowest concentration of SARS-CoV-2 viral copies this assay can detect is 138 copies/mL. A negative result does not preclude SARS-Cov-2 infection and should not be used as the sole basis for treatment or other patient management decisions. A negative result may occur with  improper specimen collection/handling, submission of specimen other than nasopharyngeal swab, presence of viral mutation(s) within the areas targeted by this assay, and inadequate number of viral copies(<138 copies/mL). A negative result must be combined with clinical observations, patient history, and epidemiological information. The expected result is Negative.  Fact Sheet for Patients:  EntrepreneurPulse.com.au  Fact Sheet for Healthcare Providers:  IncredibleEmployment.be  This test is no t yet approved or cleared by the [place] FDA and  has been authorized for detection and/or diagnosis of SARS-CoV-2 by FDA under an Emergency Use Authorization (EUA). This EUA will remain  in effect (meaning this test can be used) for the duration of the COVID-19 declaration under Section 564(b)(1) of the Act, 21 U.S.C.section 360bbb-3(b)(1), unless the authorization is terminated  or revoked sooner.       Influenza A by PCR NEGATIVE NEGATIVE Final   Influenza B by PCR NEGATIVE NEGATIVE Final    Comment: (NOTE) The Xpert Xpress  SARS-CoV-2/FLU/RSV plus assay is intended as an aid in the diagnosis of influenza from Nasopharyngeal swab specimens and should not be used as a sole basis for treatment. Nasal washings and aspirates are unacceptable for Xpert Xpress SARS-CoV-2/FLU/RSV testing.  Fact Sheet for Patients: EntrepreneurPulse.com.au  Fact Sheet for Healthcare Providers: IncredibleEmployment.be  This test is not yet approved or cleared by the [place] FDA and has been authorized for detection and/or diagnosis of SARS-CoV-2 by FDA under an Emergency Use Authorization (EUA). This EUA will remain in effect (meaning this test can be used) for the duration of the COVID-19 declaration under Section 564(b)(1) of the Act, 21 U.S.C. section 360bbb-3(b)(1), unless the authorization is terminated or revoked.  Performed at [facility], [place] [address], [place], [place] [address]      Labs: BNP (last 3 results) Recent Labs    12/19/21 1358  BNP 8,889.1*   Basic Metabolic Panel: Recent Labs  Lab 12/20/21 0650 12/21/21 0405 12/22/21 0349 12/23/21 0305 12/24/21 0316  NA 141 138 138 138 137  K 4.8 4.0 3.9 4.7 3.9  CL 115* 111 109 107 106  CO2 14* 15* 18* 17* 18*  GLUCOSE 89 81 120* 105* 90  BUN 73* 85* 90* 53* 58*  CREATININE 6.05* 6.19* 6.33* 4.32* 5.52*  CALCIUM 8.6* 8.5* 8.3* 8.3* 7.7*  PHOS  --  5.8*  --   --   --    Liver Function Tests: Recent Labs  Lab 12/21/21 0405  ALBUMIN 2.6*   No results for input(s): LIPASE, AMYLASE in the last 168 hours. No results for input(s): AMMONIA in the last 168 hours. CBC: Recent Labs  Lab 12/19/21 1358 12/19/21 1443 12/22/21 0349 12/22/21 1712 12/23/21 0305 12/24/21 0316 12/24/21 1432 12/25/21 0326  WBC 4.6  --  3.6*  --  6.9 8.1  --   --   NEUTROABS 3.3  --   --   --   --   --   --   --   HGB 7.7*   < > 6.6* 7.1* 7.4* 5.5* 8.6*  8.9*  HCT 23.3*   < > 19.3* 20.3* 22.0* 15.8* 25.6* 26.6*  MCV 87.3  --   85.0  --  85.6 85.9  --   --   PLT 207  --  183  --  163 120*  --   --    < > = values in this interval not displayed.   Cardiac Enzymes: No results for input(s): CKTOTAL, CKMB, CKMBINDEX, TROPONINI in the last 168 hours. BNP: Invalid input(s): POCBNP CBG: Recent Labs  Lab 12/24/21 0637 12/24/21 1138 12/24/21 1820 12/24/21 2109 12/25/21 0607  GLUCAP 83 118* 94 184* 80   D-Dimer No results for input(s): DDIMER in the last 72 hours. Hgb A1c No results for input(s): HGBA1C in the last 72 hours. Lipid Profile No results for input(s): CHOL, HDL, LDLCALC, TRIG, CHOLHDL, LDLDIRECT in the last 72 hours. Thyroid function studies No results for input(s): TSH, T4TOTAL, T3FREE, THYROIDAB in the last 72 hours.  Invalid input(s): FREET3 Anemia work up No results for input(s): VITAMINB12, FOLATE, FERRITIN, TIBC, IRON, RETICCTPCT in the last 72 hours. Urinalysis    Component Value Date/Time   COLORURINE STRAW (A) 12/19/2021 [place] 12/19/2021 0137   LABSPEC 1.008 12/19/2021 0137   PHURINE 5.0 12/19/2021 0137   GLUCOSEU NEGATIVE 12/19/2021 0137   HGBUR MODERATE (A) 12/19/2021 0137   BILIRUBINUR NEGATIVE 12/19/2021 0137   KETONESUR NEGATIVE 12/19/2021 0137   PROTEINUR 100 (A) 12/19/2021 0137   NITRITE NEGATIVE 12/19/2021 0137   LEUKOCYTESUR NEGATIVE 12/19/2021 0137   Sepsis Labs Invalid input(s): PROCALCITONIN,  WBC,  LACTICIDVEN Microbiology Recent Results (from the past 240 hour(s))  Resp Panel by RT-PCR (Flu A&B, Covid) Nasopharyngeal Swab     Status: None   Collection Time: 12/19/21  1:59 PM   Specimen: Nasopharyngeal Swab; Nasopharyngeal(NP) swabs in vial transport medium  Result Value Ref Range Status   SARS Coronavirus 2 by RT PCR NEGATIVE NEGATIVE Final    Comment: (NOTE) SARS-CoV-2 target nucleic acids are NOT DETECTED.  The SARS-CoV-2 RNA is generally detectable in upper respiratory specimens during the acute phase of infection. The  lowest concentration of SARS-CoV-2 viral copies this assay can detect is 138 copies/mL. A negative result does not preclude SARS-Cov-2 infection and should not be used as the sole basis for treatment or other patient management decisions. A negative result may occur with  improper specimen collection/handling, submission of specimen other than nasopharyngeal swab, presence of viral mutation(s) within the areas targeted by this assay, and inadequate number of viral copies(<138 copies/mL). A negative result must be combined with clinical observations, patient history, and epidemiological information. The expected result is Negative.  Fact Sheet for Patients:  EntrepreneurPulse.com.au  Fact Sheet for Healthcare Providers:  IncredibleEmployment.be  This test is no t yet approved or cleared by the [place] FDA and  has been authorized for detection and/or diagnosis of SARS-CoV-2 by FDA under an Emergency Use Authorization (EUA). This EUA will remain  in effect (meaning this test can be used) for the duration of the COVID-19 declaration under Section 564(b)(1) of the Act, 21 U.S.C.section 360bbb-3(b)(1), unless the authorization is terminated  or revoked sooner.       Influenza A by PCR NEGATIVE NEGATIVE Final   Influenza B by PCR NEGATIVE NEGATIVE Final    Comment: (NOTE) The Xpert Xpress SARS-CoV-2/FLU/RSV plus assay is intended as an aid in the diagnosis of influenza from Nasopharyngeal swab specimens and should not be used as a sole basis for treatment. Nasal  washings and aspirates are unacceptable for Xpert Xpress SARS-CoV-2/FLU/RSV testing.  Fact Sheet for Patients: EntrepreneurPulse.com.au  Fact Sheet for Healthcare Providers: IncredibleEmployment.be  This test is not yet approved or cleared by the [place] FDA and has been authorized for detection and/or diagnosis of SARS-CoV-2 by FDA under  an Emergency Use Authorization (EUA). This EUA will remain in effect (meaning this test can be used) for the duration of the COVID-19 declaration under Section 564(b)(1) of the Act, 21 U.S.C. section 360bbb-3(b)(1), unless the authorization is terminated or revoked.  Performed at [facility], [place] [address] [place], [place] [address]      Time coordinating discharge: 45 minutes  SIGNED:   Tawni Millers, MD  [facility] 12/25/2021, 10:45 AM

## 2021-12-25 NOTE — Progress Notes (Signed)
Subjective:  2nd HD yest-  did not take fluid because was orthostatic  -  then was SOB-  given lasix and did respond well to lasix-  this introduction to dialysis has not gone well-  she says arm is still bleeding-  I did not take dressing down    Objective Vital signs in last 24 hours: Vitals:   12/24/21 1838 12/24/21 1842 12/24/21 1955 12/25/21 0410  BP: (!) 179/88 (!) 174/85 135/68 (!) 145/74  Pulse: 88  75   Resp: (!) 26  20   Temp: (!) 97.4 F (36.3 C)  97.7 F (36.5 C) 98.4 F (36.9 C)  TempSrc: Oral  Oral Oral  SpO2: 99%  96% 94%  Weight:    59.1 kg  Height:       Weight change: 0.405 kg  Intake/Output Summary (Last 24 hours) at 12/25/2021 7893 Last data filed at 12/24/2021 2317 Gross per 24 hour  Intake 1040.5 ml  Output 1050 ml  Net -9.5 ml    Assessment/Plan: 76 year old black female with advanced CKD now with symptoms of volume overload and uremia 1.Renal- advanced CKD with uremic symptoms, low albumin, volume overload as well as anemia-  for initiation of dialysis this hosp.  Agrees to the second stage of her BVT and tunneled dialysis catheter placement -   done 8/10 but with complication of bleeding and hematoma evac.  Also had first HD 1/26- second on 1/27.   Patient would like to do third shift at [facility] if possible as an OP - will not be possible for now-  have set up at [facility] MWF early AM-  she knows discharge plan.  She is cleared for discharge from my standpoint 2. Hypertension/volume  -was initially overloaded and having symptoms-did respond very well to Lasix -  kept making good urine after lasix stopped yesterday-  then orthostatic-  then trying to be positive volume status which gave her return of sxms-  she has a very small comfort window with her volume-  will give lasix iv again today pre discharge and send out on 40 mg PO-  I am thinking to take on off dialysis days to supplement her volume removal so will not need so much UF with HD 3. Anemia  -this is significant  and likely contributing to her symptoms.  I gave ESA and  iron stores are low, will replete- also did require 3 units of blood this hosp  4. Bones- PTH 159 -  phos is high-  have started Kristen Loader    Labs: Basic Metabolic Panel: Recent Labs  Lab 12/21/21 0405 12/22/21 0349 12/23/21 0305 12/24/21 0316  NA 138 138 138 137  K 4.0 3.9 4.7 3.9  CL 111 109 107 106  CO2 15* 18* 17* 18*  GLUCOSE 81 120* 105* 90  BUN 85* 90* 53* 58*  CREATININE 6.19* 6.33* 4.32* 5.52*  CALCIUM 8.5* 8.3* 8.3* 7.7*  PHOS 5.8*  --   --   --    Liver Function Tests: Recent Labs  Lab 12/21/21 0405  ALBUMIN 2.6*   No results for input(s): LIPASE, AMYLASE in the last 168 hours. No results for input(s): AMMONIA in the last 168 hours. CBC: Recent Labs  Lab 12/19/21 1358 12/19/21 1443 12/22/21 0349 12/22/21 1712 12/23/21 0305 12/24/21 0316 12/24/21 1432 12/25/21 0326  WBC 4.6  --  3.6*  --  6.9 8.1  --   --   NEUTROABS 3.3  --   --   --   --   --   --   --  HGB 7.7*   < > 6.6*   < > 7.4* 5.5* 8.6* 8.9*  HCT 23.3*   < > 19.3*   < > 22.0* 15.8* 25.6* 26.6*  MCV 87.3  --  85.0  --  85.6 85.9  --   --   PLT 207  --  183  --  163 120*  --   --    < > = values in this interval not displayed.   Cardiac Enzymes: No results for input(s): CKTOTAL, CKMB, CKMBINDEX, TROPONINI in the last 168 hours. CBG: Recent Labs  Lab 12/24/21 0637 12/24/21 1138 12/24/21 1820 12/24/21 2109 12/25/21 0607  GLUCAP 83 118* 94 184* 80    Iron Studies:  No results for input(s): IRON, TIBC, TRANSFERRIN, FERRITIN in the last 72 hours.  Studies/Results: No results found. Medications: Infusions:  sodium chloride Stopped (12/24/21 0820)   anticoagulant sodium citrate     ferric gluconate (FERRLECIT) IVPB 250 mg (12/24/21 1146)    Scheduled Medications:  acetaminophen  650 mg Oral Q6H   carvedilol  6.25 mg Oral BID WC   Chlorhexidine Gluconate Cloth  6 each Topical Q0600    darbepoetin (ARANESP) injection - NON-DIALYSIS  200 mcg Subcutaneous Q Tue-1800   ferric citrate  210 mg Oral TID WC   heparin  5,000 Units Subcutaneous Q8H   insulin aspart  0-9 Units Subcutaneous TID WC   rosuvastatin  15 mg Oral Daily   sodium chloride flush  3 mL Intravenous Q12H    have reviewed scheduled and prn medications.  Physical Exam: General:  NAD-   Heart: RRR-  new TDC Lungs: mostly clear Abdomen: soft, non tender Extremities: no edema Dialysis Access: left arm bandaged-  I still cannot hear bruit thru bandage this AM    12/25/2021,8:12 AM  LOS: 6 days

## 2021-12-25 NOTE — TOC Transition Note (Signed)
Transition of Care [facility]) - CM/SW Discharge Note   Patient Details  Name: Jackie Wade MRN: [id number] Date of Birth: 1946/05/25  Transition of Care [facility]) CM/SW Contact:  Carles Collet, RN Phone Number: 12/25/2021, 11:54 AM   Clinical Narrative:    Damaris Schooner w patient at bedside. Patient refused HH PT OT, irritated that it was recommended by therapies and at [facility] for offering to set it up. Agreeable to [facility] RN, did not have preference for agency.  Verified demographics w patient.  [facility] accepted  TOC signing off    Final next level of care: [place] Barriers to Discharge: No Barriers Identified   Patient Goals and CMS Choice Patient states their goals for this hospitalization and ongoing recovery are:: return home   Choice offered to / list presented to : Patient  Discharge Placement                       Discharge Plan and Services In-house Referral: NA Discharge Planning Services: CM Consult Post Acute Care Choice: NA          DME Arranged: N/A DME Agency: NA       HH Arranged: RN [place] Agency: [place] Date [facility] Agency Contacted: 12/25/21 Time [place]: 68 Representative spoke with at [place]: Amy  Social Determinants of Health ([place]) Interventions     Readmission Risk Interventions Readmission Risk Prevention Plan 12/22/2021  Transportation Screening Complete  HRI or [place] Complete  Social Work Consult for [place] Planning/Counseling Complete  Palliative Care Screening Not Applicable  Medication Review Press photographer) Complete  Some recent data might be hidden

## 2021-12-25 NOTE — Progress Notes (Signed)
Patient discharged: Home with family.  Left via: Wheelchair  Discharge paperwork reviewed and given: to patient and family. Teach back completed. IV and telemetry disconnected. Belongings given to patient.  

## 2021-12-26 ENCOUNTER — Telehealth: Payer: Self-pay | Admitting: Physician Assistant

## 2021-12-26 NOTE — Telephone Encounter (Signed)
Transition of care contact from inpatient facility  Date of Discharge: 12/25/21 Date of Contact: 12/26/21 Method of contact: Phone  Attempted to contact patient to discuss transition of care from inpatient admission. Patient did not answer the phone. Unable to leave voicemail. Will follow up with her at her dialysis center.  Anice Paganini, PA-C 12/26/2021, 2:54 PM  [employer]

## 2021-12-27 DIAGNOSIS — Z992 Dependence on renal dialysis: Secondary | ICD-10-CM | POA: Diagnosis not present

## 2021-12-27 DIAGNOSIS — D631 Anemia in chronic kidney disease: Secondary | ICD-10-CM | POA: Diagnosis not present

## 2021-12-27 DIAGNOSIS — N186 End stage renal disease: Secondary | ICD-10-CM | POA: Diagnosis not present

## 2021-12-27 DIAGNOSIS — Z23 Encounter for immunization: Secondary | ICD-10-CM | POA: Diagnosis not present

## 2021-12-27 DIAGNOSIS — E1122 Type 2 diabetes mellitus with diabetic chronic kidney disease: Secondary | ICD-10-CM | POA: Diagnosis not present

## 2021-12-28 DIAGNOSIS — D689 Coagulation defect, unspecified: Secondary | ICD-10-CM | POA: Diagnosis not present

## 2021-12-28 DIAGNOSIS — D631 Anemia in chronic kidney disease: Secondary | ICD-10-CM | POA: Diagnosis not present

## 2021-12-28 DIAGNOSIS — Z992 Dependence on renal dialysis: Secondary | ICD-10-CM | POA: Diagnosis not present

## 2021-12-28 DIAGNOSIS — N186 End stage renal disease: Secondary | ICD-10-CM | POA: Diagnosis not present

## 2021-12-28 DIAGNOSIS — R519 Headache, unspecified: Secondary | ICD-10-CM | POA: Diagnosis not present

## 2021-12-30 DIAGNOSIS — N186 End stage renal disease: Secondary | ICD-10-CM | POA: Diagnosis not present

## 2021-12-30 DIAGNOSIS — Z992 Dependence on renal dialysis: Secondary | ICD-10-CM | POA: Diagnosis not present

## 2021-12-30 DIAGNOSIS — D689 Coagulation defect, unspecified: Secondary | ICD-10-CM | POA: Diagnosis not present

## 2021-12-30 DIAGNOSIS — R519 Headache, unspecified: Secondary | ICD-10-CM | POA: Diagnosis not present

## 2021-12-30 DIAGNOSIS — D631 Anemia in chronic kidney disease: Secondary | ICD-10-CM | POA: Diagnosis not present

## 2022-01-02 DIAGNOSIS — D689 Coagulation defect, unspecified: Secondary | ICD-10-CM | POA: Diagnosis not present

## 2022-01-02 DIAGNOSIS — N186 End stage renal disease: Secondary | ICD-10-CM | POA: Diagnosis not present

## 2022-01-02 DIAGNOSIS — R519 Headache, unspecified: Secondary | ICD-10-CM | POA: Diagnosis not present

## 2022-01-02 DIAGNOSIS — D631 Anemia in chronic kidney disease: Secondary | ICD-10-CM | POA: Diagnosis not present

## 2022-01-02 DIAGNOSIS — Z992 Dependence on renal dialysis: Secondary | ICD-10-CM | POA: Diagnosis not present

## 2022-01-04 DIAGNOSIS — D689 Coagulation defect, unspecified: Secondary | ICD-10-CM | POA: Diagnosis not present

## 2022-01-04 DIAGNOSIS — N186 End stage renal disease: Secondary | ICD-10-CM | POA: Diagnosis not present

## 2022-01-04 DIAGNOSIS — R519 Headache, unspecified: Secondary | ICD-10-CM | POA: Diagnosis not present

## 2022-01-04 DIAGNOSIS — Z992 Dependence on renal dialysis: Secondary | ICD-10-CM | POA: Diagnosis not present

## 2022-01-04 DIAGNOSIS — D631 Anemia in chronic kidney disease: Secondary | ICD-10-CM | POA: Diagnosis not present

## 2022-01-06 DIAGNOSIS — D689 Coagulation defect, unspecified: Secondary | ICD-10-CM | POA: Diagnosis not present

## 2022-01-06 DIAGNOSIS — D631 Anemia in chronic kidney disease: Secondary | ICD-10-CM | POA: Diagnosis not present

## 2022-01-06 DIAGNOSIS — R519 Headache, unspecified: Secondary | ICD-10-CM | POA: Diagnosis not present

## 2022-01-06 DIAGNOSIS — Z992 Dependence on renal dialysis: Secondary | ICD-10-CM | POA: Diagnosis not present

## 2022-01-06 DIAGNOSIS — N186 End stage renal disease: Secondary | ICD-10-CM | POA: Diagnosis not present

## 2022-01-09 DIAGNOSIS — D689 Coagulation defect, unspecified: Secondary | ICD-10-CM | POA: Diagnosis not present

## 2022-01-09 DIAGNOSIS — R519 Headache, unspecified: Secondary | ICD-10-CM | POA: Diagnosis not present

## 2022-01-09 DIAGNOSIS — Z992 Dependence on renal dialysis: Secondary | ICD-10-CM | POA: Diagnosis not present

## 2022-01-09 DIAGNOSIS — D631 Anemia in chronic kidney disease: Secondary | ICD-10-CM | POA: Diagnosis not present

## 2022-01-09 DIAGNOSIS — N186 End stage renal disease: Secondary | ICD-10-CM | POA: Diagnosis not present

## 2022-01-09 NOTE — Progress Notes (Signed)
[facility] [place] PROGRESS NOTE  ASSESSMENT / PLAN: Jackie Wade is a 76 y.o. female status post second stage basilic vein transposition 2/94/76 complicated by postoperative hematoma requiring operative washout.  She has been doing quite well since surgery.  She is tolerating dialysis well.  I am concerned that her left arm fistula is near thrombosing.  I will bring her back in a month with an ultrasound.  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   SUBJECTIVE: Doing well.  Tolerating dialysis.  Feels much better than predialysis.  Appetite improving.  Has healed well in the left arm.  Some frustrations, which are understandable about dialysis and dialysis access surgery.  OBJECTIVE: BP (!) 150/89 (BP Location: Right Arm, Patient Position: Sitting, Cuff Size: Normal)    Pulse 77    Temp 98.6 F (37 C)    Resp 20    Ht 5\' 3"  (1.6 m)    Wt 131 lb (59.4 kg)    SpO2 100%    BMI 23.21 kg/m   Well-appearing.  In no acute distress Regular rate and rhythm Unlabored breathing Left upper extremity well-healed.  Staples removed in clinic.  2+ dorsalis pedis pulse.  Pulsatile, weak thrill in the left arm AV fistula near its origin.  CBC Latest Ref Rng & Units 12/25/2021 12/24/2021 12/24/2021  WBC 4.0 - 10.5 K/uL - - 8.1  Hemoglobin 12.0 - 15.0 g/dL 8.9(L) 8.6(L) 5.5(LL)  Hematocrit 36.0 - 46.0 % 26.6(L) 25.6(L) 15.8(L)  Platelets 150 - 400 K/uL - - 120(L)     CMP Latest Ref Rng & Units 12/24/2021 12/23/2021 12/22/2021  Glucose 70 - 99 mg/dL 90 105(H) 120(H)  BUN 8 - 23 mg/dL 58(H) 53(H) 90(H)  Creatinine 0.44 - 1.00 mg/dL 5.52(H) 4.32(H) 6.33(H)  Sodium 135 - 145 mmol/L 137 138 138  Potassium 3.5 - 5.1 mmol/L 3.9 4.7 3.9  Chloride 98 - 111 mmol/L 106 107 109  CO2 22 - 32 mmol/L 18(L) 17(L) 18(L)  Calcium 8.9 - 10.3 mg/dL  7.7(L) 8.3(L) 8.3(L)  Total Protein 6.5 - 8.1 g/dL - - -  Total Bilirubin 0.3 - 1.2 mg/dL - - -  Alkaline Phos 38 - 126 U/L - - -  AST 15 - 41 U/L - - -  ALT 0 - 44 U/L - - -    Estimated Creatinine Clearance: 7.5 mL/min (A) (by C-G formula based on SCr of 5.52 mg/dL (H)).  Jackie Wade. Stanford Breed, MD [facility] Phone Number: [phone] 01/10/2022 4:55 PM

## 2022-01-10 ENCOUNTER — Ambulatory Visit (INDEPENDENT_AMBULATORY_CARE_PROVIDER_SITE_OTHER): Payer: Medicare Other | Admitting: Vascular Surgery

## 2022-01-10 ENCOUNTER — Encounter: Payer: Self-pay | Admitting: Vascular Surgery

## 2022-01-10 ENCOUNTER — Other Ambulatory Visit: Payer: Self-pay

## 2022-01-10 VITALS — BP 150/89 | HR 77 | Temp 98.6°F | Resp 20 | Ht 63.0 in | Wt 131.0 lb

## 2022-01-10 DIAGNOSIS — N186 End stage renal disease: Secondary | ICD-10-CM

## 2022-01-10 DIAGNOSIS — Z992 Dependence on renal dialysis: Secondary | ICD-10-CM

## 2022-01-11 DIAGNOSIS — D631 Anemia in chronic kidney disease: Secondary | ICD-10-CM | POA: Diagnosis not present

## 2022-01-11 DIAGNOSIS — R519 Headache, unspecified: Secondary | ICD-10-CM | POA: Diagnosis not present

## 2022-01-11 DIAGNOSIS — D689 Coagulation defect, unspecified: Secondary | ICD-10-CM | POA: Diagnosis not present

## 2022-01-11 DIAGNOSIS — Z992 Dependence on renal dialysis: Secondary | ICD-10-CM | POA: Diagnosis not present

## 2022-01-11 DIAGNOSIS — N186 End stage renal disease: Secondary | ICD-10-CM | POA: Diagnosis not present

## 2022-01-13 ENCOUNTER — Other Ambulatory Visit: Payer: Self-pay | Admitting: *Deleted

## 2022-01-13 DIAGNOSIS — N186 End stage renal disease: Secondary | ICD-10-CM

## 2022-01-13 DIAGNOSIS — Z992 Dependence on renal dialysis: Secondary | ICD-10-CM | POA: Diagnosis not present

## 2022-01-13 DIAGNOSIS — D689 Coagulation defect, unspecified: Secondary | ICD-10-CM | POA: Diagnosis not present

## 2022-01-13 DIAGNOSIS — R519 Headache, unspecified: Secondary | ICD-10-CM | POA: Diagnosis not present

## 2022-01-13 DIAGNOSIS — D631 Anemia in chronic kidney disease: Secondary | ICD-10-CM | POA: Diagnosis not present

## 2022-01-16 DIAGNOSIS — D631 Anemia in chronic kidney disease: Secondary | ICD-10-CM | POA: Diagnosis not present

## 2022-01-16 DIAGNOSIS — R519 Headache, unspecified: Secondary | ICD-10-CM | POA: Diagnosis not present

## 2022-01-16 DIAGNOSIS — D689 Coagulation defect, unspecified: Secondary | ICD-10-CM | POA: Diagnosis not present

## 2022-01-16 DIAGNOSIS — Z992 Dependence on renal dialysis: Secondary | ICD-10-CM | POA: Diagnosis not present

## 2022-01-16 DIAGNOSIS — N186 End stage renal disease: Secondary | ICD-10-CM | POA: Diagnosis not present

## 2022-01-18 DIAGNOSIS — N186 End stage renal disease: Secondary | ICD-10-CM | POA: Diagnosis not present

## 2022-01-18 DIAGNOSIS — D689 Coagulation defect, unspecified: Secondary | ICD-10-CM | POA: Diagnosis not present

## 2022-01-18 DIAGNOSIS — R519 Headache, unspecified: Secondary | ICD-10-CM | POA: Diagnosis not present

## 2022-01-18 DIAGNOSIS — D631 Anemia in chronic kidney disease: Secondary | ICD-10-CM | POA: Diagnosis not present

## 2022-01-18 DIAGNOSIS — Z992 Dependence on renal dialysis: Secondary | ICD-10-CM | POA: Diagnosis not present

## 2022-01-19 DIAGNOSIS — D631 Anemia in chronic kidney disease: Secondary | ICD-10-CM | POA: Diagnosis not present

## 2022-01-19 DIAGNOSIS — Z992 Dependence on renal dialysis: Secondary | ICD-10-CM | POA: Diagnosis not present

## 2022-01-19 DIAGNOSIS — R519 Headache, unspecified: Secondary | ICD-10-CM | POA: Diagnosis not present

## 2022-01-19 DIAGNOSIS — D689 Coagulation defect, unspecified: Secondary | ICD-10-CM | POA: Diagnosis not present

## 2022-01-19 DIAGNOSIS — N186 End stage renal disease: Secondary | ICD-10-CM | POA: Diagnosis not present

## 2022-01-23 DIAGNOSIS — N186 End stage renal disease: Secondary | ICD-10-CM | POA: Diagnosis not present

## 2022-01-23 DIAGNOSIS — R519 Headache, unspecified: Secondary | ICD-10-CM | POA: Diagnosis not present

## 2022-01-23 DIAGNOSIS — Z992 Dependence on renal dialysis: Secondary | ICD-10-CM | POA: Diagnosis not present

## 2022-01-23 DIAGNOSIS — D631 Anemia in chronic kidney disease: Secondary | ICD-10-CM | POA: Diagnosis not present

## 2022-01-23 DIAGNOSIS — D689 Coagulation defect, unspecified: Secondary | ICD-10-CM | POA: Diagnosis not present

## 2022-01-24 DIAGNOSIS — Z992 Dependence on renal dialysis: Secondary | ICD-10-CM | POA: Diagnosis not present

## 2022-01-24 DIAGNOSIS — E1122 Type 2 diabetes mellitus with diabetic chronic kidney disease: Secondary | ICD-10-CM | POA: Diagnosis not present

## 2022-01-24 DIAGNOSIS — N186 End stage renal disease: Secondary | ICD-10-CM | POA: Diagnosis not present

## 2022-01-25 ENCOUNTER — Other Ambulatory Visit: Payer: Self-pay

## 2022-01-25 DIAGNOSIS — E876 Hypokalemia: Secondary | ICD-10-CM | POA: Diagnosis not present

## 2022-01-25 DIAGNOSIS — Z992 Dependence on renal dialysis: Secondary | ICD-10-CM | POA: Diagnosis not present

## 2022-01-25 DIAGNOSIS — T80211A Bloodstream infection due to central venous catheter, initial encounter: Secondary | ICD-10-CM | POA: Diagnosis not present

## 2022-01-25 DIAGNOSIS — D631 Anemia in chronic kidney disease: Secondary | ICD-10-CM | POA: Diagnosis not present

## 2022-01-25 DIAGNOSIS — R0602 Shortness of breath: Secondary | ICD-10-CM | POA: Diagnosis not present

## 2022-01-25 DIAGNOSIS — D689 Coagulation defect, unspecified: Secondary | ICD-10-CM | POA: Diagnosis not present

## 2022-01-25 DIAGNOSIS — N186 End stage renal disease: Secondary | ICD-10-CM | POA: Diagnosis not present

## 2022-01-25 DIAGNOSIS — Z23 Encounter for immunization: Secondary | ICD-10-CM | POA: Diagnosis not present

## 2022-01-25 NOTE — Progress Notes (Signed)
Error

## 2022-01-27 DIAGNOSIS — N186 End stage renal disease: Secondary | ICD-10-CM | POA: Diagnosis not present

## 2022-01-27 DIAGNOSIS — Z992 Dependence on renal dialysis: Secondary | ICD-10-CM | POA: Diagnosis not present

## 2022-01-27 DIAGNOSIS — D689 Coagulation defect, unspecified: Secondary | ICD-10-CM | POA: Diagnosis not present

## 2022-01-27 DIAGNOSIS — D631 Anemia in chronic kidney disease: Secondary | ICD-10-CM | POA: Diagnosis not present

## 2022-01-27 DIAGNOSIS — T80211A Bloodstream infection due to central venous catheter, initial encounter: Secondary | ICD-10-CM | POA: Diagnosis not present

## 2022-01-27 DIAGNOSIS — Z23 Encounter for immunization: Secondary | ICD-10-CM | POA: Diagnosis not present

## 2022-01-27 DIAGNOSIS — E876 Hypokalemia: Secondary | ICD-10-CM | POA: Diagnosis not present

## 2022-01-30 DIAGNOSIS — Z23 Encounter for immunization: Secondary | ICD-10-CM | POA: Diagnosis not present

## 2022-01-30 DIAGNOSIS — N186 End stage renal disease: Secondary | ICD-10-CM | POA: Diagnosis not present

## 2022-01-30 DIAGNOSIS — D689 Coagulation defect, unspecified: Secondary | ICD-10-CM | POA: Diagnosis not present

## 2022-01-30 DIAGNOSIS — T80211A Bloodstream infection due to central venous catheter, initial encounter: Secondary | ICD-10-CM | POA: Diagnosis not present

## 2022-01-30 DIAGNOSIS — D631 Anemia in chronic kidney disease: Secondary | ICD-10-CM | POA: Diagnosis not present

## 2022-01-30 DIAGNOSIS — E876 Hypokalemia: Secondary | ICD-10-CM | POA: Diagnosis not present

## 2022-01-30 DIAGNOSIS — Z992 Dependence on renal dialysis: Secondary | ICD-10-CM | POA: Diagnosis not present

## 2022-02-01 DIAGNOSIS — Z23 Encounter for immunization: Secondary | ICD-10-CM | POA: Diagnosis not present

## 2022-02-01 DIAGNOSIS — N186 End stage renal disease: Secondary | ICD-10-CM | POA: Diagnosis not present

## 2022-02-01 DIAGNOSIS — T80211A Bloodstream infection due to central venous catheter, initial encounter: Secondary | ICD-10-CM | POA: Diagnosis not present

## 2022-02-01 DIAGNOSIS — E876 Hypokalemia: Secondary | ICD-10-CM | POA: Diagnosis not present

## 2022-02-01 DIAGNOSIS — Z992 Dependence on renal dialysis: Secondary | ICD-10-CM | POA: Diagnosis not present

## 2022-02-01 DIAGNOSIS — D689 Coagulation defect, unspecified: Secondary | ICD-10-CM | POA: Diagnosis not present

## 2022-02-01 DIAGNOSIS — D631 Anemia in chronic kidney disease: Secondary | ICD-10-CM | POA: Diagnosis not present

## 2022-02-03 DIAGNOSIS — N186 End stage renal disease: Secondary | ICD-10-CM | POA: Diagnosis not present

## 2022-02-03 DIAGNOSIS — D631 Anemia in chronic kidney disease: Secondary | ICD-10-CM | POA: Diagnosis not present

## 2022-02-03 DIAGNOSIS — D689 Coagulation defect, unspecified: Secondary | ICD-10-CM | POA: Diagnosis not present

## 2022-02-03 DIAGNOSIS — Z992 Dependence on renal dialysis: Secondary | ICD-10-CM | POA: Diagnosis not present

## 2022-02-03 DIAGNOSIS — T80211A Bloodstream infection due to central venous catheter, initial encounter: Secondary | ICD-10-CM | POA: Diagnosis not present

## 2022-02-03 DIAGNOSIS — Z23 Encounter for immunization: Secondary | ICD-10-CM | POA: Diagnosis not present

## 2022-02-03 DIAGNOSIS — E876 Hypokalemia: Secondary | ICD-10-CM | POA: Diagnosis not present

## 2022-02-06 DIAGNOSIS — E876 Hypokalemia: Secondary | ICD-10-CM | POA: Diagnosis not present

## 2022-02-06 DIAGNOSIS — Z23 Encounter for immunization: Secondary | ICD-10-CM | POA: Diagnosis not present

## 2022-02-06 DIAGNOSIS — Z992 Dependence on renal dialysis: Secondary | ICD-10-CM | POA: Diagnosis not present

## 2022-02-06 DIAGNOSIS — D631 Anemia in chronic kidney disease: Secondary | ICD-10-CM | POA: Diagnosis not present

## 2022-02-06 DIAGNOSIS — T80211A Bloodstream infection due to central venous catheter, initial encounter: Secondary | ICD-10-CM | POA: Diagnosis not present

## 2022-02-06 DIAGNOSIS — N186 End stage renal disease: Secondary | ICD-10-CM | POA: Diagnosis not present

## 2022-02-06 DIAGNOSIS — D689 Coagulation defect, unspecified: Secondary | ICD-10-CM | POA: Diagnosis not present

## 2022-02-06 NOTE — Progress Notes (Unsigned)
[facility] OF [place] PROGRESS NOTE  ASSESSMENT / PLAN: Jackie Wade is a 76 y.o. female status post second stage basilic vein transposition 7/93/90 complicated by postoperative hematoma requiring operative washout.  She has been doing quite well since surgery.  She is tolerating dialysis well.  I am concerned that her left arm fistula is near thrombosing.  I will bring her back in a month with an ultrasound.  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   SUBJECTIVE: Doing well.  Tolerating dialysis.  Feels much better than predialysis.  Appetite improving.  Has healed well in the left arm.  Some frustrations, which are understandable about dialysis and dialysis access surgery.  OBJECTIVE: There were no vitals taken for this visit.  Well-appearing.  In no acute distress Regular rate and rhythm Unlabored breathing Left upper extremity well-healed.  Staples removed in clinic.  2+ dorsalis pedis pulse.  Pulsatile, weak thrill in the left arm AV fistula near its origin.  CBC Latest Ref Rng & Units 12/25/2021 12/24/2021 12/24/2021  WBC 4.0 - 10.5 K/uL - - 8.1  Hemoglobin 12.0 - 15.0 g/dL 8.9(L) 8.6(L) 5.5(LL)  Hematocrit 36.0 - 46.0 % 26.6(L) 25.6(L) 15.8(L)  Platelets 150 - 400 K/uL - - 120(L)     CMP Latest Ref Rng & Units 12/24/2021 12/23/2021 12/22/2021  Glucose 70 - 99 mg/dL 90 105(H) 120(H)  BUN 8 - 23 mg/dL 58(H) 53(H) 90(H)  Creatinine 0.44 - 1.00 mg/dL 5.52(H) 4.32(H) 6.33(H)  Sodium 135 - 145 mmol/L 137 138 138  Potassium 3.5 - 5.1 mmol/L 3.9 4.7 3.9  Chloride 98 - 111 mmol/L 106 107 109  CO2 22 - 32 mmol/L 18(L) 17(L) 18(L)  Calcium 8.9 - 10.3 mg/dL 7.7(L) 8.3(L) 8.3(L)  Total Protein 6.5 - 8.1 g/dL - - -  Total Bilirubin 0.3 - 1.2 mg/dL - - -  Alkaline Phos 38 - 126 U/L - - -  AST 15 - 41 U/L - - -  ALT 0 - 44  U/L - - -    CrCl cannot be calculated (Patient's most recent lab result is older than the maximum 21 days allowed.).  Jackie Wade. Stanford Breed, MD [facility] Phone Number: [phone] 02/06/2022 9:07 PM

## 2022-02-07 ENCOUNTER — Encounter: Payer: Self-pay | Admitting: Vascular Surgery

## 2022-02-07 ENCOUNTER — Encounter (HOSPITAL_COMMUNITY): Payer: Medicare Other

## 2022-02-07 ENCOUNTER — Ambulatory Visit (HOSPITAL_COMMUNITY)
Admission: RE | Admit: 2022-02-07 | Discharge: 2022-02-07 | Disposition: A | Discharge status: Home / Self Care | Payer: Medicare Other | Source: Ambulatory Visit | Attending: Vascular Surgery | Admitting: Vascular Surgery

## 2022-02-07 ENCOUNTER — Ambulatory Visit: Payer: Medicare Other | Admitting: Vascular Surgery

## 2022-02-07 ENCOUNTER — Other Ambulatory Visit: Payer: Self-pay

## 2022-02-07 VITALS — BP 178/85 | HR 87 | Temp 98.7°F | Resp 20 | Ht 63.0 in

## 2022-02-07 DIAGNOSIS — Z992 Dependence on renal dialysis: Secondary | ICD-10-CM | POA: Insufficient documentation

## 2022-02-07 DIAGNOSIS — N186 End stage renal disease: Secondary | ICD-10-CM | POA: Diagnosis not present

## 2022-02-08 DIAGNOSIS — Z23 Encounter for immunization: Secondary | ICD-10-CM | POA: Diagnosis not present

## 2022-02-08 DIAGNOSIS — T80211A Bloodstream infection due to central venous catheter, initial encounter: Secondary | ICD-10-CM | POA: Diagnosis not present

## 2022-02-08 DIAGNOSIS — Z992 Dependence on renal dialysis: Secondary | ICD-10-CM | POA: Diagnosis not present

## 2022-02-08 DIAGNOSIS — E876 Hypokalemia: Secondary | ICD-10-CM | POA: Diagnosis not present

## 2022-02-08 DIAGNOSIS — D631 Anemia in chronic kidney disease: Secondary | ICD-10-CM | POA: Diagnosis not present

## 2022-02-08 DIAGNOSIS — N186 End stage renal disease: Secondary | ICD-10-CM | POA: Diagnosis not present

## 2022-02-08 DIAGNOSIS — D689 Coagulation defect, unspecified: Secondary | ICD-10-CM | POA: Diagnosis not present

## 2022-02-10 ENCOUNTER — Other Ambulatory Visit: Payer: Self-pay | Admitting: *Deleted

## 2022-02-10 DIAGNOSIS — N186 End stage renal disease: Secondary | ICD-10-CM | POA: Diagnosis not present

## 2022-02-10 DIAGNOSIS — T80211A Bloodstream infection due to central venous catheter, initial encounter: Secondary | ICD-10-CM | POA: Diagnosis not present

## 2022-02-10 DIAGNOSIS — Z23 Encounter for immunization: Secondary | ICD-10-CM | POA: Diagnosis not present

## 2022-02-10 DIAGNOSIS — D631 Anemia in chronic kidney disease: Secondary | ICD-10-CM | POA: Diagnosis not present

## 2022-02-10 DIAGNOSIS — D689 Coagulation defect, unspecified: Secondary | ICD-10-CM | POA: Diagnosis not present

## 2022-02-10 DIAGNOSIS — E876 Hypokalemia: Secondary | ICD-10-CM | POA: Diagnosis not present

## 2022-02-10 DIAGNOSIS — Z992 Dependence on renal dialysis: Secondary | ICD-10-CM | POA: Diagnosis not present

## 2022-02-13 DIAGNOSIS — D631 Anemia in chronic kidney disease: Secondary | ICD-10-CM | POA: Diagnosis not present

## 2022-02-13 DIAGNOSIS — Z992 Dependence on renal dialysis: Secondary | ICD-10-CM | POA: Diagnosis not present

## 2022-02-13 DIAGNOSIS — N186 End stage renal disease: Secondary | ICD-10-CM | POA: Diagnosis not present

## 2022-02-13 DIAGNOSIS — T80211A Bloodstream infection due to central venous catheter, initial encounter: Secondary | ICD-10-CM | POA: Diagnosis not present

## 2022-02-13 DIAGNOSIS — D689 Coagulation defect, unspecified: Secondary | ICD-10-CM | POA: Diagnosis not present

## 2022-02-13 DIAGNOSIS — E876 Hypokalemia: Secondary | ICD-10-CM | POA: Diagnosis not present

## 2022-02-13 DIAGNOSIS — Z23 Encounter for immunization: Secondary | ICD-10-CM | POA: Diagnosis not present

## 2022-02-15 DIAGNOSIS — Z992 Dependence on renal dialysis: Secondary | ICD-10-CM | POA: Diagnosis not present

## 2022-02-15 DIAGNOSIS — Z23 Encounter for immunization: Secondary | ICD-10-CM | POA: Diagnosis not present

## 2022-02-15 DIAGNOSIS — D631 Anemia in chronic kidney disease: Secondary | ICD-10-CM | POA: Diagnosis not present

## 2022-02-15 DIAGNOSIS — D689 Coagulation defect, unspecified: Secondary | ICD-10-CM | POA: Diagnosis not present

## 2022-02-15 DIAGNOSIS — N186 End stage renal disease: Secondary | ICD-10-CM | POA: Diagnosis not present

## 2022-02-15 DIAGNOSIS — T80211A Bloodstream infection due to central venous catheter, initial encounter: Secondary | ICD-10-CM | POA: Diagnosis not present

## 2022-02-15 DIAGNOSIS — E876 Hypokalemia: Secondary | ICD-10-CM | POA: Diagnosis not present

## 2022-02-17 DIAGNOSIS — N186 End stage renal disease: Secondary | ICD-10-CM | POA: Diagnosis not present

## 2022-02-17 DIAGNOSIS — D689 Coagulation defect, unspecified: Secondary | ICD-10-CM | POA: Diagnosis not present

## 2022-02-17 DIAGNOSIS — D631 Anemia in chronic kidney disease: Secondary | ICD-10-CM | POA: Diagnosis not present

## 2022-02-17 DIAGNOSIS — T80211A Bloodstream infection due to central venous catheter, initial encounter: Secondary | ICD-10-CM | POA: Diagnosis not present

## 2022-02-17 DIAGNOSIS — Z992 Dependence on renal dialysis: Secondary | ICD-10-CM | POA: Diagnosis not present

## 2022-02-17 DIAGNOSIS — E876 Hypokalemia: Secondary | ICD-10-CM | POA: Diagnosis not present

## 2022-02-17 DIAGNOSIS — Z23 Encounter for immunization: Secondary | ICD-10-CM | POA: Diagnosis not present

## 2022-02-20 DIAGNOSIS — Z992 Dependence on renal dialysis: Secondary | ICD-10-CM | POA: Diagnosis not present

## 2022-02-20 DIAGNOSIS — N186 End stage renal disease: Secondary | ICD-10-CM | POA: Diagnosis not present

## 2022-02-20 DIAGNOSIS — D689 Coagulation defect, unspecified: Secondary | ICD-10-CM | POA: Diagnosis not present

## 2022-02-20 DIAGNOSIS — D631 Anemia in chronic kidney disease: Secondary | ICD-10-CM | POA: Diagnosis not present

## 2022-02-20 DIAGNOSIS — T80211A Bloodstream infection due to central venous catheter, initial encounter: Secondary | ICD-10-CM | POA: Diagnosis not present

## 2022-02-20 DIAGNOSIS — Z23 Encounter for immunization: Secondary | ICD-10-CM | POA: Diagnosis not present

## 2022-02-20 DIAGNOSIS — E876 Hypokalemia: Secondary | ICD-10-CM | POA: Diagnosis not present

## 2022-02-22 DIAGNOSIS — Z992 Dependence on renal dialysis: Secondary | ICD-10-CM | POA: Diagnosis not present

## 2022-02-22 DIAGNOSIS — D631 Anemia in chronic kidney disease: Secondary | ICD-10-CM | POA: Diagnosis not present

## 2022-02-22 DIAGNOSIS — E876 Hypokalemia: Secondary | ICD-10-CM | POA: Diagnosis not present

## 2022-02-22 DIAGNOSIS — D689 Coagulation defect, unspecified: Secondary | ICD-10-CM | POA: Diagnosis not present

## 2022-02-22 DIAGNOSIS — N186 End stage renal disease: Secondary | ICD-10-CM | POA: Diagnosis not present

## 2022-02-24 DIAGNOSIS — Z992 Dependence on renal dialysis: Secondary | ICD-10-CM | POA: Diagnosis not present

## 2022-02-24 DIAGNOSIS — D631 Anemia in chronic kidney disease: Secondary | ICD-10-CM | POA: Diagnosis not present

## 2022-02-24 DIAGNOSIS — E1122 Type 2 diabetes mellitus with diabetic chronic kidney disease: Secondary | ICD-10-CM | POA: Diagnosis not present

## 2022-02-24 DIAGNOSIS — D689 Coagulation defect, unspecified: Secondary | ICD-10-CM | POA: Diagnosis not present

## 2022-02-24 DIAGNOSIS — E876 Hypokalemia: Secondary | ICD-10-CM | POA: Diagnosis not present

## 2022-02-24 DIAGNOSIS — N186 End stage renal disease: Secondary | ICD-10-CM | POA: Diagnosis not present

## 2022-02-27 DIAGNOSIS — N186 End stage renal disease: Secondary | ICD-10-CM | POA: Diagnosis not present

## 2022-02-27 DIAGNOSIS — Z23 Encounter for immunization: Secondary | ICD-10-CM | POA: Diagnosis not present

## 2022-02-27 DIAGNOSIS — D689 Coagulation defect, unspecified: Secondary | ICD-10-CM | POA: Diagnosis not present

## 2022-02-27 DIAGNOSIS — Z992 Dependence on renal dialysis: Secondary | ICD-10-CM | POA: Diagnosis not present

## 2022-02-27 DIAGNOSIS — D631 Anemia in chronic kidney disease: Secondary | ICD-10-CM | POA: Diagnosis not present

## 2022-02-27 DIAGNOSIS — E876 Hypokalemia: Secondary | ICD-10-CM | POA: Diagnosis not present

## 2022-03-01 DIAGNOSIS — D689 Coagulation defect, unspecified: Secondary | ICD-10-CM | POA: Diagnosis not present

## 2022-03-01 DIAGNOSIS — N186 End stage renal disease: Secondary | ICD-10-CM | POA: Diagnosis not present

## 2022-03-01 DIAGNOSIS — E876 Hypokalemia: Secondary | ICD-10-CM | POA: Diagnosis not present

## 2022-03-01 DIAGNOSIS — Z992 Dependence on renal dialysis: Secondary | ICD-10-CM | POA: Diagnosis not present

## 2022-03-01 DIAGNOSIS — Z23 Encounter for immunization: Secondary | ICD-10-CM | POA: Diagnosis not present

## 2022-03-01 DIAGNOSIS — D631 Anemia in chronic kidney disease: Secondary | ICD-10-CM | POA: Diagnosis not present

## 2022-03-03 DIAGNOSIS — Z992 Dependence on renal dialysis: Secondary | ICD-10-CM | POA: Diagnosis not present

## 2022-03-03 DIAGNOSIS — D689 Coagulation defect, unspecified: Secondary | ICD-10-CM | POA: Diagnosis not present

## 2022-03-03 DIAGNOSIS — N186 End stage renal disease: Secondary | ICD-10-CM | POA: Diagnosis not present

## 2022-03-03 DIAGNOSIS — E876 Hypokalemia: Secondary | ICD-10-CM | POA: Diagnosis not present

## 2022-03-03 DIAGNOSIS — D631 Anemia in chronic kidney disease: Secondary | ICD-10-CM | POA: Diagnosis not present

## 2022-03-03 DIAGNOSIS — Z23 Encounter for immunization: Secondary | ICD-10-CM | POA: Diagnosis not present

## 2022-03-06 DIAGNOSIS — Z23 Encounter for immunization: Secondary | ICD-10-CM | POA: Diagnosis not present

## 2022-03-06 DIAGNOSIS — E876 Hypokalemia: Secondary | ICD-10-CM | POA: Diagnosis not present

## 2022-03-06 DIAGNOSIS — D689 Coagulation defect, unspecified: Secondary | ICD-10-CM | POA: Diagnosis not present

## 2022-03-06 DIAGNOSIS — D631 Anemia in chronic kidney disease: Secondary | ICD-10-CM | POA: Diagnosis not present

## 2022-03-06 DIAGNOSIS — Z992 Dependence on renal dialysis: Secondary | ICD-10-CM | POA: Diagnosis not present

## 2022-03-06 DIAGNOSIS — N186 End stage renal disease: Secondary | ICD-10-CM | POA: Diagnosis not present

## 2022-03-08 DIAGNOSIS — Z992 Dependence on renal dialysis: Secondary | ICD-10-CM | POA: Diagnosis not present

## 2022-03-08 DIAGNOSIS — E876 Hypokalemia: Secondary | ICD-10-CM | POA: Diagnosis not present

## 2022-03-08 DIAGNOSIS — D631 Anemia in chronic kidney disease: Secondary | ICD-10-CM | POA: Diagnosis not present

## 2022-03-08 DIAGNOSIS — Z23 Encounter for immunization: Secondary | ICD-10-CM | POA: Diagnosis not present

## 2022-03-08 DIAGNOSIS — N186 End stage renal disease: Secondary | ICD-10-CM | POA: Diagnosis not present

## 2022-03-08 DIAGNOSIS — D689 Coagulation defect, unspecified: Secondary | ICD-10-CM | POA: Diagnosis not present

## 2022-03-10 DIAGNOSIS — Z992 Dependence on renal dialysis: Secondary | ICD-10-CM | POA: Diagnosis not present

## 2022-03-10 DIAGNOSIS — E876 Hypokalemia: Secondary | ICD-10-CM | POA: Diagnosis not present

## 2022-03-10 DIAGNOSIS — D689 Coagulation defect, unspecified: Secondary | ICD-10-CM | POA: Diagnosis not present

## 2022-03-10 DIAGNOSIS — Z23 Encounter for immunization: Secondary | ICD-10-CM | POA: Diagnosis not present

## 2022-03-10 DIAGNOSIS — D631 Anemia in chronic kidney disease: Secondary | ICD-10-CM | POA: Diagnosis not present

## 2022-03-10 DIAGNOSIS — N186 End stage renal disease: Secondary | ICD-10-CM | POA: Diagnosis not present

## 2022-03-13 DIAGNOSIS — D631 Anemia in chronic kidney disease: Secondary | ICD-10-CM | POA: Diagnosis not present

## 2022-03-13 DIAGNOSIS — Z992 Dependence on renal dialysis: Secondary | ICD-10-CM | POA: Diagnosis not present

## 2022-03-13 DIAGNOSIS — D689 Coagulation defect, unspecified: Secondary | ICD-10-CM | POA: Diagnosis not present

## 2022-03-13 DIAGNOSIS — Z23 Encounter for immunization: Secondary | ICD-10-CM | POA: Diagnosis not present

## 2022-03-13 DIAGNOSIS — N186 End stage renal disease: Secondary | ICD-10-CM | POA: Diagnosis not present

## 2022-03-13 DIAGNOSIS — E876 Hypokalemia: Secondary | ICD-10-CM | POA: Diagnosis not present

## 2022-03-15 DIAGNOSIS — N186 End stage renal disease: Secondary | ICD-10-CM | POA: Diagnosis not present

## 2022-03-15 DIAGNOSIS — D689 Coagulation defect, unspecified: Secondary | ICD-10-CM | POA: Diagnosis not present

## 2022-03-15 DIAGNOSIS — Z23 Encounter for immunization: Secondary | ICD-10-CM | POA: Diagnosis not present

## 2022-03-15 DIAGNOSIS — Z992 Dependence on renal dialysis: Secondary | ICD-10-CM | POA: Diagnosis not present

## 2022-03-15 DIAGNOSIS — E876 Hypokalemia: Secondary | ICD-10-CM | POA: Diagnosis not present

## 2022-03-15 DIAGNOSIS — D631 Anemia in chronic kidney disease: Secondary | ICD-10-CM | POA: Diagnosis not present

## 2022-03-17 DIAGNOSIS — E876 Hypokalemia: Secondary | ICD-10-CM | POA: Diagnosis not present

## 2022-03-17 DIAGNOSIS — D631 Anemia in chronic kidney disease: Secondary | ICD-10-CM | POA: Diagnosis not present

## 2022-03-17 DIAGNOSIS — N186 End stage renal disease: Secondary | ICD-10-CM | POA: Diagnosis not present

## 2022-03-17 DIAGNOSIS — Z992 Dependence on renal dialysis: Secondary | ICD-10-CM | POA: Diagnosis not present

## 2022-03-17 DIAGNOSIS — Z23 Encounter for immunization: Secondary | ICD-10-CM | POA: Diagnosis not present

## 2022-03-17 DIAGNOSIS — D689 Coagulation defect, unspecified: Secondary | ICD-10-CM | POA: Diagnosis not present

## 2022-03-20 DIAGNOSIS — E876 Hypokalemia: Secondary | ICD-10-CM | POA: Diagnosis not present

## 2022-03-20 DIAGNOSIS — D689 Coagulation defect, unspecified: Secondary | ICD-10-CM | POA: Diagnosis not present

## 2022-03-20 DIAGNOSIS — Z992 Dependence on renal dialysis: Secondary | ICD-10-CM | POA: Diagnosis not present

## 2022-03-20 DIAGNOSIS — N186 End stage renal disease: Secondary | ICD-10-CM | POA: Diagnosis not present

## 2022-03-20 DIAGNOSIS — Z23 Encounter for immunization: Secondary | ICD-10-CM | POA: Diagnosis not present

## 2022-03-20 DIAGNOSIS — D631 Anemia in chronic kidney disease: Secondary | ICD-10-CM | POA: Diagnosis not present

## 2022-03-22 DIAGNOSIS — N186 End stage renal disease: Secondary | ICD-10-CM | POA: Diagnosis not present

## 2022-03-22 DIAGNOSIS — D689 Coagulation defect, unspecified: Secondary | ICD-10-CM | POA: Diagnosis not present

## 2022-03-22 DIAGNOSIS — Z992 Dependence on renal dialysis: Secondary | ICD-10-CM | POA: Diagnosis not present

## 2022-03-22 DIAGNOSIS — D631 Anemia in chronic kidney disease: Secondary | ICD-10-CM | POA: Diagnosis not present

## 2022-03-24 DIAGNOSIS — D631 Anemia in chronic kidney disease: Secondary | ICD-10-CM | POA: Diagnosis not present

## 2022-03-24 DIAGNOSIS — N186 End stage renal disease: Secondary | ICD-10-CM | POA: Diagnosis not present

## 2022-03-24 DIAGNOSIS — D689 Coagulation defect, unspecified: Secondary | ICD-10-CM | POA: Diagnosis not present

## 2022-03-24 DIAGNOSIS — Z992 Dependence on renal dialysis: Secondary | ICD-10-CM | POA: Diagnosis not present

## 2022-03-26 DIAGNOSIS — N186 End stage renal disease: Secondary | ICD-10-CM | POA: Diagnosis not present

## 2022-03-26 DIAGNOSIS — Z992 Dependence on renal dialysis: Secondary | ICD-10-CM | POA: Diagnosis not present

## 2022-03-26 DIAGNOSIS — E1122 Type 2 diabetes mellitus with diabetic chronic kidney disease: Secondary | ICD-10-CM | POA: Diagnosis not present

## 2022-03-27 DIAGNOSIS — Z23 Encounter for immunization: Secondary | ICD-10-CM | POA: Diagnosis not present

## 2022-03-27 DIAGNOSIS — Z992 Dependence on renal dialysis: Secondary | ICD-10-CM | POA: Diagnosis not present

## 2022-03-27 DIAGNOSIS — D631 Anemia in chronic kidney disease: Secondary | ICD-10-CM | POA: Diagnosis not present

## 2022-03-27 DIAGNOSIS — N186 End stage renal disease: Secondary | ICD-10-CM | POA: Diagnosis not present

## 2022-03-27 DIAGNOSIS — D689 Coagulation defect, unspecified: Secondary | ICD-10-CM | POA: Diagnosis not present

## 2022-03-28 DIAGNOSIS — D631 Anemia in chronic kidney disease: Secondary | ICD-10-CM | POA: Diagnosis not present

## 2022-03-28 DIAGNOSIS — Z992 Dependence on renal dialysis: Secondary | ICD-10-CM | POA: Diagnosis not present

## 2022-03-28 DIAGNOSIS — Z4931 Encounter for adequacy testing for hemodialysis: Secondary | ICD-10-CM | POA: Diagnosis not present

## 2022-03-28 DIAGNOSIS — N186 End stage renal disease: Secondary | ICD-10-CM | POA: Diagnosis not present

## 2022-03-28 DIAGNOSIS — D689 Coagulation defect, unspecified: Secondary | ICD-10-CM | POA: Diagnosis not present

## 2022-03-30 DIAGNOSIS — D689 Coagulation defect, unspecified: Secondary | ICD-10-CM | POA: Diagnosis not present

## 2022-03-30 DIAGNOSIS — Z992 Dependence on renal dialysis: Secondary | ICD-10-CM | POA: Diagnosis not present

## 2022-03-30 DIAGNOSIS — D631 Anemia in chronic kidney disease: Secondary | ICD-10-CM | POA: Diagnosis not present

## 2022-03-30 DIAGNOSIS — N186 End stage renal disease: Secondary | ICD-10-CM | POA: Diagnosis not present

## 2022-03-30 DIAGNOSIS — Z4931 Encounter for adequacy testing for hemodialysis: Secondary | ICD-10-CM | POA: Diagnosis not present

## 2022-04-03 DIAGNOSIS — D631 Anemia in chronic kidney disease: Secondary | ICD-10-CM | POA: Diagnosis not present

## 2022-04-03 DIAGNOSIS — Z992 Dependence on renal dialysis: Secondary | ICD-10-CM | POA: Diagnosis not present

## 2022-04-03 DIAGNOSIS — D689 Coagulation defect, unspecified: Secondary | ICD-10-CM | POA: Diagnosis not present

## 2022-04-03 DIAGNOSIS — Z23 Encounter for immunization: Secondary | ICD-10-CM | POA: Diagnosis not present

## 2022-04-03 DIAGNOSIS — N186 End stage renal disease: Secondary | ICD-10-CM | POA: Diagnosis not present

## 2022-04-05 DIAGNOSIS — N186 End stage renal disease: Secondary | ICD-10-CM | POA: Diagnosis not present

## 2022-04-05 DIAGNOSIS — N2581 Secondary hyperparathyroidism of renal origin: Secondary | ICD-10-CM | POA: Diagnosis not present

## 2022-04-05 DIAGNOSIS — D689 Coagulation defect, unspecified: Secondary | ICD-10-CM | POA: Diagnosis not present

## 2022-04-05 DIAGNOSIS — Z992 Dependence on renal dialysis: Secondary | ICD-10-CM | POA: Diagnosis not present

## 2022-04-07 DIAGNOSIS — Z23 Encounter for immunization: Secondary | ICD-10-CM | POA: Diagnosis not present

## 2022-04-07 DIAGNOSIS — D689 Coagulation defect, unspecified: Secondary | ICD-10-CM | POA: Diagnosis not present

## 2022-04-07 DIAGNOSIS — N186 End stage renal disease: Secondary | ICD-10-CM | POA: Diagnosis not present

## 2022-04-07 DIAGNOSIS — D631 Anemia in chronic kidney disease: Secondary | ICD-10-CM | POA: Diagnosis not present

## 2022-04-07 DIAGNOSIS — Z992 Dependence on renal dialysis: Secondary | ICD-10-CM | POA: Diagnosis not present

## 2022-04-10 DIAGNOSIS — D689 Coagulation defect, unspecified: Secondary | ICD-10-CM | POA: Diagnosis not present

## 2022-04-10 DIAGNOSIS — Z23 Encounter for immunization: Secondary | ICD-10-CM | POA: Diagnosis not present

## 2022-04-10 DIAGNOSIS — N186 End stage renal disease: Secondary | ICD-10-CM | POA: Diagnosis not present

## 2022-04-10 DIAGNOSIS — D631 Anemia in chronic kidney disease: Secondary | ICD-10-CM | POA: Diagnosis not present

## 2022-04-10 DIAGNOSIS — Z992 Dependence on renal dialysis: Secondary | ICD-10-CM | POA: Diagnosis not present

## 2022-04-12 DIAGNOSIS — D689 Coagulation defect, unspecified: Secondary | ICD-10-CM | POA: Diagnosis not present

## 2022-04-12 DIAGNOSIS — D631 Anemia in chronic kidney disease: Secondary | ICD-10-CM | POA: Diagnosis not present

## 2022-04-12 DIAGNOSIS — Z23 Encounter for immunization: Secondary | ICD-10-CM | POA: Diagnosis not present

## 2022-04-12 DIAGNOSIS — Z992 Dependence on renal dialysis: Secondary | ICD-10-CM | POA: Diagnosis not present

## 2022-04-12 DIAGNOSIS — N186 End stage renal disease: Secondary | ICD-10-CM | POA: Diagnosis not present

## 2022-04-14 DIAGNOSIS — Z23 Encounter for immunization: Secondary | ICD-10-CM | POA: Diagnosis not present

## 2022-04-14 DIAGNOSIS — D631 Anemia in chronic kidney disease: Secondary | ICD-10-CM | POA: Diagnosis not present

## 2022-04-14 DIAGNOSIS — N186 End stage renal disease: Secondary | ICD-10-CM | POA: Diagnosis not present

## 2022-04-14 DIAGNOSIS — Z992 Dependence on renal dialysis: Secondary | ICD-10-CM | POA: Diagnosis not present

## 2022-04-14 DIAGNOSIS — D689 Coagulation defect, unspecified: Secondary | ICD-10-CM | POA: Diagnosis not present

## 2022-04-17 DIAGNOSIS — Z23 Encounter for immunization: Secondary | ICD-10-CM | POA: Diagnosis not present

## 2022-04-17 DIAGNOSIS — Z992 Dependence on renal dialysis: Secondary | ICD-10-CM | POA: Diagnosis not present

## 2022-04-17 DIAGNOSIS — N186 End stage renal disease: Secondary | ICD-10-CM | POA: Diagnosis not present

## 2022-04-17 DIAGNOSIS — D689 Coagulation defect, unspecified: Secondary | ICD-10-CM | POA: Diagnosis not present

## 2022-04-17 DIAGNOSIS — D631 Anemia in chronic kidney disease: Secondary | ICD-10-CM | POA: Diagnosis not present

## 2022-04-19 DIAGNOSIS — N186 End stage renal disease: Secondary | ICD-10-CM | POA: Diagnosis not present

## 2022-04-19 DIAGNOSIS — D689 Coagulation defect, unspecified: Secondary | ICD-10-CM | POA: Diagnosis not present

## 2022-04-19 DIAGNOSIS — D631 Anemia in chronic kidney disease: Secondary | ICD-10-CM | POA: Diagnosis not present

## 2022-04-19 DIAGNOSIS — Z992 Dependence on renal dialysis: Secondary | ICD-10-CM | POA: Diagnosis not present

## 2022-04-19 DIAGNOSIS — Z23 Encounter for immunization: Secondary | ICD-10-CM | POA: Diagnosis not present

## 2022-04-21 DIAGNOSIS — N186 End stage renal disease: Secondary | ICD-10-CM | POA: Diagnosis not present

## 2022-04-21 DIAGNOSIS — Z23 Encounter for immunization: Secondary | ICD-10-CM | POA: Diagnosis not present

## 2022-04-21 DIAGNOSIS — D631 Anemia in chronic kidney disease: Secondary | ICD-10-CM | POA: Diagnosis not present

## 2022-04-21 DIAGNOSIS — D689 Coagulation defect, unspecified: Secondary | ICD-10-CM | POA: Diagnosis not present

## 2022-04-21 DIAGNOSIS — Z992 Dependence on renal dialysis: Secondary | ICD-10-CM | POA: Diagnosis not present

## 2022-04-24 DIAGNOSIS — D689 Coagulation defect, unspecified: Secondary | ICD-10-CM | POA: Diagnosis not present

## 2022-04-24 DIAGNOSIS — D631 Anemia in chronic kidney disease: Secondary | ICD-10-CM | POA: Diagnosis not present

## 2022-04-24 DIAGNOSIS — N186 End stage renal disease: Secondary | ICD-10-CM | POA: Diagnosis not present

## 2022-04-24 DIAGNOSIS — Z23 Encounter for immunization: Secondary | ICD-10-CM | POA: Diagnosis not present

## 2022-04-24 DIAGNOSIS — Z992 Dependence on renal dialysis: Secondary | ICD-10-CM | POA: Diagnosis not present

## 2022-04-26 DIAGNOSIS — D689 Coagulation defect, unspecified: Secondary | ICD-10-CM | POA: Diagnosis not present

## 2022-04-26 DIAGNOSIS — D631 Anemia in chronic kidney disease: Secondary | ICD-10-CM | POA: Diagnosis not present

## 2022-04-26 DIAGNOSIS — Z992 Dependence on renal dialysis: Secondary | ICD-10-CM | POA: Diagnosis not present

## 2022-04-26 DIAGNOSIS — Z23 Encounter for immunization: Secondary | ICD-10-CM | POA: Diagnosis not present

## 2022-04-26 DIAGNOSIS — E1122 Type 2 diabetes mellitus with diabetic chronic kidney disease: Secondary | ICD-10-CM | POA: Diagnosis not present

## 2022-04-26 DIAGNOSIS — N186 End stage renal disease: Secondary | ICD-10-CM | POA: Diagnosis not present

## 2022-04-28 DIAGNOSIS — E1122 Type 2 diabetes mellitus with diabetic chronic kidney disease: Secondary | ICD-10-CM | POA: Diagnosis not present

## 2022-04-28 DIAGNOSIS — D689 Coagulation defect, unspecified: Secondary | ICD-10-CM | POA: Diagnosis not present

## 2022-04-28 DIAGNOSIS — Z992 Dependence on renal dialysis: Secondary | ICD-10-CM | POA: Diagnosis not present

## 2022-04-28 DIAGNOSIS — D631 Anemia in chronic kidney disease: Secondary | ICD-10-CM | POA: Diagnosis not present

## 2022-04-28 DIAGNOSIS — N186 End stage renal disease: Secondary | ICD-10-CM | POA: Diagnosis not present

## 2022-04-28 DIAGNOSIS — E876 Hypokalemia: Secondary | ICD-10-CM | POA: Diagnosis not present

## 2022-05-01 DIAGNOSIS — Z992 Dependence on renal dialysis: Secondary | ICD-10-CM | POA: Diagnosis not present

## 2022-05-01 DIAGNOSIS — E1122 Type 2 diabetes mellitus with diabetic chronic kidney disease: Secondary | ICD-10-CM | POA: Diagnosis not present

## 2022-05-01 DIAGNOSIS — E876 Hypokalemia: Secondary | ICD-10-CM | POA: Diagnosis not present

## 2022-05-01 DIAGNOSIS — N186 End stage renal disease: Secondary | ICD-10-CM | POA: Diagnosis not present

## 2022-05-01 DIAGNOSIS — D689 Coagulation defect, unspecified: Secondary | ICD-10-CM | POA: Diagnosis not present

## 2022-05-01 DIAGNOSIS — D631 Anemia in chronic kidney disease: Secondary | ICD-10-CM | POA: Diagnosis not present

## 2022-05-03 DIAGNOSIS — Z992 Dependence on renal dialysis: Secondary | ICD-10-CM | POA: Diagnosis not present

## 2022-05-03 DIAGNOSIS — E1122 Type 2 diabetes mellitus with diabetic chronic kidney disease: Secondary | ICD-10-CM | POA: Diagnosis not present

## 2022-05-03 DIAGNOSIS — D631 Anemia in chronic kidney disease: Secondary | ICD-10-CM | POA: Diagnosis not present

## 2022-05-03 DIAGNOSIS — N186 End stage renal disease: Secondary | ICD-10-CM | POA: Diagnosis not present

## 2022-05-03 DIAGNOSIS — E876 Hypokalemia: Secondary | ICD-10-CM | POA: Diagnosis not present

## 2022-05-03 DIAGNOSIS — D689 Coagulation defect, unspecified: Secondary | ICD-10-CM | POA: Diagnosis not present

## 2022-05-05 DIAGNOSIS — E876 Hypokalemia: Secondary | ICD-10-CM | POA: Diagnosis not present

## 2022-05-05 DIAGNOSIS — D631 Anemia in chronic kidney disease: Secondary | ICD-10-CM | POA: Diagnosis not present

## 2022-05-05 DIAGNOSIS — N186 End stage renal disease: Secondary | ICD-10-CM | POA: Diagnosis not present

## 2022-05-05 DIAGNOSIS — E1122 Type 2 diabetes mellitus with diabetic chronic kidney disease: Secondary | ICD-10-CM | POA: Diagnosis not present

## 2022-05-05 DIAGNOSIS — D689 Coagulation defect, unspecified: Secondary | ICD-10-CM | POA: Diagnosis not present

## 2022-05-05 DIAGNOSIS — Z992 Dependence on renal dialysis: Secondary | ICD-10-CM | POA: Diagnosis not present

## 2022-05-08 DIAGNOSIS — D631 Anemia in chronic kidney disease: Secondary | ICD-10-CM | POA: Diagnosis not present

## 2022-05-08 DIAGNOSIS — E876 Hypokalemia: Secondary | ICD-10-CM | POA: Diagnosis not present

## 2022-05-08 DIAGNOSIS — D689 Coagulation defect, unspecified: Secondary | ICD-10-CM | POA: Diagnosis not present

## 2022-05-08 DIAGNOSIS — E1122 Type 2 diabetes mellitus with diabetic chronic kidney disease: Secondary | ICD-10-CM | POA: Diagnosis not present

## 2022-05-08 DIAGNOSIS — Z992 Dependence on renal dialysis: Secondary | ICD-10-CM | POA: Diagnosis not present

## 2022-05-08 DIAGNOSIS — N186 End stage renal disease: Secondary | ICD-10-CM | POA: Diagnosis not present

## 2022-05-10 DIAGNOSIS — D631 Anemia in chronic kidney disease: Secondary | ICD-10-CM | POA: Diagnosis not present

## 2022-05-10 DIAGNOSIS — N186 End stage renal disease: Secondary | ICD-10-CM | POA: Diagnosis not present

## 2022-05-10 DIAGNOSIS — D689 Coagulation defect, unspecified: Secondary | ICD-10-CM | POA: Diagnosis not present

## 2022-05-10 DIAGNOSIS — E1122 Type 2 diabetes mellitus with diabetic chronic kidney disease: Secondary | ICD-10-CM | POA: Diagnosis not present

## 2022-05-10 DIAGNOSIS — E876 Hypokalemia: Secondary | ICD-10-CM | POA: Diagnosis not present

## 2022-05-10 DIAGNOSIS — Z992 Dependence on renal dialysis: Secondary | ICD-10-CM | POA: Diagnosis not present

## 2022-05-12 DIAGNOSIS — Z992 Dependence on renal dialysis: Secondary | ICD-10-CM | POA: Diagnosis not present

## 2022-05-12 DIAGNOSIS — N186 End stage renal disease: Secondary | ICD-10-CM | POA: Diagnosis not present

## 2022-05-12 DIAGNOSIS — D689 Coagulation defect, unspecified: Secondary | ICD-10-CM | POA: Diagnosis not present

## 2022-05-12 DIAGNOSIS — E876 Hypokalemia: Secondary | ICD-10-CM | POA: Diagnosis not present

## 2022-05-12 DIAGNOSIS — E1122 Type 2 diabetes mellitus with diabetic chronic kidney disease: Secondary | ICD-10-CM | POA: Diagnosis not present

## 2022-05-12 DIAGNOSIS — D631 Anemia in chronic kidney disease: Secondary | ICD-10-CM | POA: Diagnosis not present

## 2022-05-15 DIAGNOSIS — Z992 Dependence on renal dialysis: Secondary | ICD-10-CM | POA: Diagnosis not present

## 2022-05-15 DIAGNOSIS — E876 Hypokalemia: Secondary | ICD-10-CM | POA: Diagnosis not present

## 2022-05-15 DIAGNOSIS — N186 End stage renal disease: Secondary | ICD-10-CM | POA: Diagnosis not present

## 2022-05-15 DIAGNOSIS — D689 Coagulation defect, unspecified: Secondary | ICD-10-CM | POA: Diagnosis not present

## 2022-05-15 DIAGNOSIS — E1122 Type 2 diabetes mellitus with diabetic chronic kidney disease: Secondary | ICD-10-CM | POA: Diagnosis not present

## 2022-05-15 DIAGNOSIS — D631 Anemia in chronic kidney disease: Secondary | ICD-10-CM | POA: Diagnosis not present

## 2022-05-16 ENCOUNTER — Ambulatory Visit: Payer: Medicare Other | Admitting: Vascular Surgery

## 2022-05-16 ENCOUNTER — Encounter (HOSPITAL_COMMUNITY): Payer: Medicare Other

## 2022-05-16 DIAGNOSIS — Z01818 Encounter for other preprocedural examination: Secondary | ICD-10-CM | POA: Diagnosis not present

## 2022-05-17 DIAGNOSIS — D631 Anemia in chronic kidney disease: Secondary | ICD-10-CM | POA: Diagnosis not present

## 2022-05-17 DIAGNOSIS — D689 Coagulation defect, unspecified: Secondary | ICD-10-CM | POA: Diagnosis not present

## 2022-05-17 DIAGNOSIS — E1122 Type 2 diabetes mellitus with diabetic chronic kidney disease: Secondary | ICD-10-CM | POA: Diagnosis not present

## 2022-05-17 DIAGNOSIS — N186 End stage renal disease: Secondary | ICD-10-CM | POA: Diagnosis not present

## 2022-05-17 DIAGNOSIS — E876 Hypokalemia: Secondary | ICD-10-CM | POA: Diagnosis not present

## 2022-05-17 DIAGNOSIS — Z992 Dependence on renal dialysis: Secondary | ICD-10-CM | POA: Diagnosis not present

## 2022-05-19 DIAGNOSIS — E1122 Type 2 diabetes mellitus with diabetic chronic kidney disease: Secondary | ICD-10-CM | POA: Diagnosis not present

## 2022-05-19 DIAGNOSIS — D689 Coagulation defect, unspecified: Secondary | ICD-10-CM | POA: Diagnosis not present

## 2022-05-19 DIAGNOSIS — D631 Anemia in chronic kidney disease: Secondary | ICD-10-CM | POA: Diagnosis not present

## 2022-05-19 DIAGNOSIS — E876 Hypokalemia: Secondary | ICD-10-CM | POA: Diagnosis not present

## 2022-05-19 DIAGNOSIS — Z992 Dependence on renal dialysis: Secondary | ICD-10-CM | POA: Diagnosis not present

## 2022-05-19 DIAGNOSIS — N186 End stage renal disease: Secondary | ICD-10-CM | POA: Diagnosis not present

## 2022-05-22 DIAGNOSIS — E876 Hypokalemia: Secondary | ICD-10-CM | POA: Diagnosis not present

## 2022-05-22 DIAGNOSIS — E1122 Type 2 diabetes mellitus with diabetic chronic kidney disease: Secondary | ICD-10-CM | POA: Diagnosis not present

## 2022-05-22 DIAGNOSIS — Z992 Dependence on renal dialysis: Secondary | ICD-10-CM | POA: Diagnosis not present

## 2022-05-22 DIAGNOSIS — D689 Coagulation defect, unspecified: Secondary | ICD-10-CM | POA: Diagnosis not present

## 2022-05-22 DIAGNOSIS — N186 End stage renal disease: Secondary | ICD-10-CM | POA: Diagnosis not present

## 2022-05-22 DIAGNOSIS — D631 Anemia in chronic kidney disease: Secondary | ICD-10-CM | POA: Diagnosis not present

## 2022-05-24 DIAGNOSIS — D631 Anemia in chronic kidney disease: Secondary | ICD-10-CM | POA: Diagnosis not present

## 2022-05-24 DIAGNOSIS — D689 Coagulation defect, unspecified: Secondary | ICD-10-CM | POA: Diagnosis not present

## 2022-05-24 DIAGNOSIS — E876 Hypokalemia: Secondary | ICD-10-CM | POA: Diagnosis not present

## 2022-05-24 DIAGNOSIS — Z992 Dependence on renal dialysis: Secondary | ICD-10-CM | POA: Diagnosis not present

## 2022-05-24 DIAGNOSIS — N186 End stage renal disease: Secondary | ICD-10-CM | POA: Diagnosis not present

## 2022-05-24 DIAGNOSIS — E1122 Type 2 diabetes mellitus with diabetic chronic kidney disease: Secondary | ICD-10-CM | POA: Diagnosis not present

## 2022-05-26 DIAGNOSIS — E876 Hypokalemia: Secondary | ICD-10-CM | POA: Diagnosis not present

## 2022-05-26 DIAGNOSIS — D631 Anemia in chronic kidney disease: Secondary | ICD-10-CM | POA: Diagnosis not present

## 2022-05-26 DIAGNOSIS — E1122 Type 2 diabetes mellitus with diabetic chronic kidney disease: Secondary | ICD-10-CM | POA: Diagnosis not present

## 2022-05-26 DIAGNOSIS — Z992 Dependence on renal dialysis: Secondary | ICD-10-CM | POA: Diagnosis not present

## 2022-05-26 DIAGNOSIS — N186 End stage renal disease: Secondary | ICD-10-CM | POA: Diagnosis not present

## 2022-05-26 DIAGNOSIS — D689 Coagulation defect, unspecified: Secondary | ICD-10-CM | POA: Diagnosis not present

## 2022-05-29 DIAGNOSIS — D689 Coagulation defect, unspecified: Secondary | ICD-10-CM | POA: Diagnosis not present

## 2022-05-29 DIAGNOSIS — N186 End stage renal disease: Secondary | ICD-10-CM | POA: Diagnosis not present

## 2022-05-29 DIAGNOSIS — Z23 Encounter for immunization: Secondary | ICD-10-CM | POA: Diagnosis not present

## 2022-05-29 DIAGNOSIS — D631 Anemia in chronic kidney disease: Secondary | ICD-10-CM | POA: Diagnosis not present

## 2022-05-29 DIAGNOSIS — Z992 Dependence on renal dialysis: Secondary | ICD-10-CM | POA: Diagnosis not present

## 2022-05-29 DIAGNOSIS — E876 Hypokalemia: Secondary | ICD-10-CM | POA: Diagnosis not present

## 2022-05-31 DIAGNOSIS — Z992 Dependence on renal dialysis: Secondary | ICD-10-CM | POA: Diagnosis not present

## 2022-05-31 DIAGNOSIS — D689 Coagulation defect, unspecified: Secondary | ICD-10-CM | POA: Diagnosis not present

## 2022-05-31 DIAGNOSIS — D631 Anemia in chronic kidney disease: Secondary | ICD-10-CM | POA: Diagnosis not present

## 2022-05-31 DIAGNOSIS — N186 End stage renal disease: Secondary | ICD-10-CM | POA: Diagnosis not present

## 2022-05-31 DIAGNOSIS — E876 Hypokalemia: Secondary | ICD-10-CM | POA: Diagnosis not present

## 2022-05-31 DIAGNOSIS — Z23 Encounter for immunization: Secondary | ICD-10-CM | POA: Diagnosis not present

## 2022-06-02 DIAGNOSIS — N186 End stage renal disease: Secondary | ICD-10-CM | POA: Diagnosis not present

## 2022-06-02 DIAGNOSIS — E876 Hypokalemia: Secondary | ICD-10-CM | POA: Diagnosis not present

## 2022-06-02 DIAGNOSIS — D689 Coagulation defect, unspecified: Secondary | ICD-10-CM | POA: Diagnosis not present

## 2022-06-02 DIAGNOSIS — D631 Anemia in chronic kidney disease: Secondary | ICD-10-CM | POA: Diagnosis not present

## 2022-06-02 DIAGNOSIS — Z23 Encounter for immunization: Secondary | ICD-10-CM | POA: Diagnosis not present

## 2022-06-02 DIAGNOSIS — Z992 Dependence on renal dialysis: Secondary | ICD-10-CM | POA: Diagnosis not present

## 2022-06-05 DIAGNOSIS — D631 Anemia in chronic kidney disease: Secondary | ICD-10-CM | POA: Diagnosis not present

## 2022-06-05 DIAGNOSIS — E876 Hypokalemia: Secondary | ICD-10-CM | POA: Diagnosis not present

## 2022-06-05 DIAGNOSIS — Z992 Dependence on renal dialysis: Secondary | ICD-10-CM | POA: Diagnosis not present

## 2022-06-05 DIAGNOSIS — Z23 Encounter for immunization: Secondary | ICD-10-CM | POA: Diagnosis not present

## 2022-06-05 DIAGNOSIS — D689 Coagulation defect, unspecified: Secondary | ICD-10-CM | POA: Diagnosis not present

## 2022-06-05 DIAGNOSIS — N186 End stage renal disease: Secondary | ICD-10-CM | POA: Diagnosis not present

## 2022-06-07 DIAGNOSIS — D689 Coagulation defect, unspecified: Secondary | ICD-10-CM | POA: Diagnosis not present

## 2022-06-07 DIAGNOSIS — Z992 Dependence on renal dialysis: Secondary | ICD-10-CM | POA: Diagnosis not present

## 2022-06-07 DIAGNOSIS — Z23 Encounter for immunization: Secondary | ICD-10-CM | POA: Diagnosis not present

## 2022-06-07 DIAGNOSIS — D631 Anemia in chronic kidney disease: Secondary | ICD-10-CM | POA: Diagnosis not present

## 2022-06-07 DIAGNOSIS — E876 Hypokalemia: Secondary | ICD-10-CM | POA: Diagnosis not present

## 2022-06-07 DIAGNOSIS — N186 End stage renal disease: Secondary | ICD-10-CM | POA: Diagnosis not present

## 2022-06-09 DIAGNOSIS — Z992 Dependence on renal dialysis: Secondary | ICD-10-CM | POA: Diagnosis not present

## 2022-06-09 DIAGNOSIS — D689 Coagulation defect, unspecified: Secondary | ICD-10-CM | POA: Diagnosis not present

## 2022-06-09 DIAGNOSIS — D631 Anemia in chronic kidney disease: Secondary | ICD-10-CM | POA: Diagnosis not present

## 2022-06-09 DIAGNOSIS — E876 Hypokalemia: Secondary | ICD-10-CM | POA: Diagnosis not present

## 2022-06-09 DIAGNOSIS — Z23 Encounter for immunization: Secondary | ICD-10-CM | POA: Diagnosis not present

## 2022-06-09 DIAGNOSIS — N186 End stage renal disease: Secondary | ICD-10-CM | POA: Diagnosis not present

## 2022-06-12 DIAGNOSIS — Z23 Encounter for immunization: Secondary | ICD-10-CM | POA: Diagnosis not present

## 2022-06-12 DIAGNOSIS — D689 Coagulation defect, unspecified: Secondary | ICD-10-CM | POA: Diagnosis not present

## 2022-06-12 DIAGNOSIS — D631 Anemia in chronic kidney disease: Secondary | ICD-10-CM | POA: Diagnosis not present

## 2022-06-12 DIAGNOSIS — E876 Hypokalemia: Secondary | ICD-10-CM | POA: Diagnosis not present

## 2022-06-12 DIAGNOSIS — N186 End stage renal disease: Secondary | ICD-10-CM | POA: Diagnosis not present

## 2022-06-12 DIAGNOSIS — Z992 Dependence on renal dialysis: Secondary | ICD-10-CM | POA: Diagnosis not present

## 2022-06-14 DIAGNOSIS — Z23 Encounter for immunization: Secondary | ICD-10-CM | POA: Diagnosis not present

## 2022-06-14 DIAGNOSIS — E876 Hypokalemia: Secondary | ICD-10-CM | POA: Diagnosis not present

## 2022-06-14 DIAGNOSIS — N186 End stage renal disease: Secondary | ICD-10-CM | POA: Diagnosis not present

## 2022-06-14 DIAGNOSIS — Z992 Dependence on renal dialysis: Secondary | ICD-10-CM | POA: Diagnosis not present

## 2022-06-14 DIAGNOSIS — D689 Coagulation defect, unspecified: Secondary | ICD-10-CM | POA: Diagnosis not present

## 2022-06-14 DIAGNOSIS — D631 Anemia in chronic kidney disease: Secondary | ICD-10-CM | POA: Diagnosis not present

## 2022-06-16 DIAGNOSIS — N186 End stage renal disease: Secondary | ICD-10-CM | POA: Diagnosis not present

## 2022-06-16 DIAGNOSIS — D631 Anemia in chronic kidney disease: Secondary | ICD-10-CM | POA: Diagnosis not present

## 2022-06-16 DIAGNOSIS — D689 Coagulation defect, unspecified: Secondary | ICD-10-CM | POA: Diagnosis not present

## 2022-06-16 DIAGNOSIS — Z23 Encounter for immunization: Secondary | ICD-10-CM | POA: Diagnosis not present

## 2022-06-16 DIAGNOSIS — Z992 Dependence on renal dialysis: Secondary | ICD-10-CM | POA: Diagnosis not present

## 2022-06-16 DIAGNOSIS — E876 Hypokalemia: Secondary | ICD-10-CM | POA: Diagnosis not present

## 2022-06-19 DIAGNOSIS — E876 Hypokalemia: Secondary | ICD-10-CM | POA: Diagnosis not present

## 2022-06-19 DIAGNOSIS — D689 Coagulation defect, unspecified: Secondary | ICD-10-CM | POA: Diagnosis not present

## 2022-06-19 DIAGNOSIS — D631 Anemia in chronic kidney disease: Secondary | ICD-10-CM | POA: Diagnosis not present

## 2022-06-19 DIAGNOSIS — Z992 Dependence on renal dialysis: Secondary | ICD-10-CM | POA: Diagnosis not present

## 2022-06-19 DIAGNOSIS — N186 End stage renal disease: Secondary | ICD-10-CM | POA: Diagnosis not present

## 2022-06-19 DIAGNOSIS — Z23 Encounter for immunization: Secondary | ICD-10-CM | POA: Diagnosis not present

## 2022-06-21 DIAGNOSIS — D631 Anemia in chronic kidney disease: Secondary | ICD-10-CM | POA: Diagnosis not present

## 2022-06-21 DIAGNOSIS — Z23 Encounter for immunization: Secondary | ICD-10-CM | POA: Diagnosis not present

## 2022-06-21 DIAGNOSIS — Z992 Dependence on renal dialysis: Secondary | ICD-10-CM | POA: Diagnosis not present

## 2022-06-21 DIAGNOSIS — D689 Coagulation defect, unspecified: Secondary | ICD-10-CM | POA: Diagnosis not present

## 2022-06-21 DIAGNOSIS — E876 Hypokalemia: Secondary | ICD-10-CM | POA: Diagnosis not present

## 2022-06-21 DIAGNOSIS — N186 End stage renal disease: Secondary | ICD-10-CM | POA: Diagnosis not present

## 2022-06-22 ENCOUNTER — Telehealth: Payer: Self-pay

## 2022-06-22 NOTE — Patient Outreach (Signed)
  Care Management   Outreach Note  06/22/2022 Name: Consepcion Utt MRN: [id number] DOB: 1946-09-22  An unsuccessful telephone outreach was attempted today. The patient was referred to the case management team for assistance with care management and care coordination.   Follow Up Plan:  The care management team will reach out to the patient again over the next 10 days.   Daneen Schick, BSW, CDP Social Worker, Certified Dementia Practitioner Care Coordination [phone]

## 2022-06-23 DIAGNOSIS — E876 Hypokalemia: Secondary | ICD-10-CM | POA: Diagnosis not present

## 2022-06-23 DIAGNOSIS — Z992 Dependence on renal dialysis: Secondary | ICD-10-CM | POA: Diagnosis not present

## 2022-06-23 DIAGNOSIS — N186 End stage renal disease: Secondary | ICD-10-CM | POA: Diagnosis not present

## 2022-06-23 DIAGNOSIS — D631 Anemia in chronic kidney disease: Secondary | ICD-10-CM | POA: Diagnosis not present

## 2022-06-23 DIAGNOSIS — Z23 Encounter for immunization: Secondary | ICD-10-CM | POA: Diagnosis not present

## 2022-06-23 DIAGNOSIS — D689 Coagulation defect, unspecified: Secondary | ICD-10-CM | POA: Diagnosis not present

## 2022-06-26 ENCOUNTER — Ambulatory Visit: Payer: Self-pay

## 2022-06-26 DIAGNOSIS — E1122 Type 2 diabetes mellitus with diabetic chronic kidney disease: Secondary | ICD-10-CM | POA: Diagnosis not present

## 2022-06-26 DIAGNOSIS — Z992 Dependence on renal dialysis: Secondary | ICD-10-CM | POA: Diagnosis not present

## 2022-06-26 DIAGNOSIS — Z23 Encounter for immunization: Secondary | ICD-10-CM | POA: Diagnosis not present

## 2022-06-26 DIAGNOSIS — N186 End stage renal disease: Secondary | ICD-10-CM | POA: Diagnosis not present

## 2022-06-26 DIAGNOSIS — D689 Coagulation defect, unspecified: Secondary | ICD-10-CM | POA: Diagnosis not present

## 2022-06-26 DIAGNOSIS — E876 Hypokalemia: Secondary | ICD-10-CM | POA: Diagnosis not present

## 2022-06-26 DIAGNOSIS — D631 Anemia in chronic kidney disease: Secondary | ICD-10-CM | POA: Diagnosis not present

## 2022-06-26 NOTE — Patient Outreach (Signed)
  Care Management   Outreach Note  06/26/2022 Name: Jackie Wade MRN: [id number] DOB: 20-Jun-1946  A second unsuccessful telephone outreach was attempted today. The patient was referred to the case management team for assistance with care management and care coordination.   Follow Up Plan:  The care management team will reach out to the patient again over the next 10 days.   Daneen Schick, BSW, CDP Social Worker, Certified Dementia Practitioner Care Coordination [phone]

## 2022-06-28 ENCOUNTER — Ambulatory Visit: Payer: Self-pay

## 2022-06-28 DIAGNOSIS — N186 End stage renal disease: Secondary | ICD-10-CM | POA: Diagnosis not present

## 2022-06-28 DIAGNOSIS — D689 Coagulation defect, unspecified: Secondary | ICD-10-CM | POA: Diagnosis not present

## 2022-06-28 DIAGNOSIS — D631 Anemia in chronic kidney disease: Secondary | ICD-10-CM | POA: Diagnosis not present

## 2022-06-28 DIAGNOSIS — E876 Hypokalemia: Secondary | ICD-10-CM | POA: Diagnosis not present

## 2022-06-28 DIAGNOSIS — Z992 Dependence on renal dialysis: Secondary | ICD-10-CM | POA: Diagnosis not present

## 2022-06-28 NOTE — Patient Outreach (Signed)
  Care Coordination   06/28/2022 Name: Jackie Wade MRN: [id number] DOB: 1946-01-06   Care Coordination Outreach Attempts:  A third unsuccessful outreach was attempted today to offer the patient with information about available care coordination services as a benefit of their health plan.   Follow Up Plan:  No further outreach attempts will be made at this time. We have been unable to contact the patient to offer or enroll patient in care coordination services  Encounter Outcome:  No Answer  Care Coordination Interventions Activated:  No   Care Coordination Interventions:  No, not indicated    Daneen Schick, BSW, CDP Social Worker, Certified Dementia Practitioner Care Coordination [phone]

## 2022-06-29 ENCOUNTER — Ambulatory Visit: Payer: Self-pay

## 2022-06-29 NOTE — Patient Outreach (Signed)
  Care Coordination   Initial Visit Note   06/29/2022 Name: Jackie Wade MRN: [id number] DOB: 08-21-1946  Jackie Wade is a 76 y.o. year old female who sees Lucianne Lei, MD for primary care. I spoke with  [name] by phone today  What matters to the patients health and wellness today?  Patient does not wish to engage with program    Goals Addressed   None     SDOH assessments and interventions completed:  No     Care Coordination Interventions Activated:  No  Care Coordination Interventions:  No, not indicated   Follow up plan: No further intervention required.   Encounter Outcome:  Pt. Refused   Daneen Schick, BSW, CDP Social Worker, Certified Dementia Practitioner Care Coordination [phone]

## 2022-06-30 DIAGNOSIS — N186 End stage renal disease: Secondary | ICD-10-CM | POA: Diagnosis not present

## 2022-06-30 DIAGNOSIS — Z992 Dependence on renal dialysis: Secondary | ICD-10-CM | POA: Diagnosis not present

## 2022-06-30 DIAGNOSIS — D631 Anemia in chronic kidney disease: Secondary | ICD-10-CM | POA: Diagnosis not present

## 2022-06-30 DIAGNOSIS — E876 Hypokalemia: Secondary | ICD-10-CM | POA: Diagnosis not present

## 2022-06-30 DIAGNOSIS — D689 Coagulation defect, unspecified: Secondary | ICD-10-CM | POA: Diagnosis not present

## 2022-07-03 DIAGNOSIS — N186 End stage renal disease: Secondary | ICD-10-CM | POA: Diagnosis not present

## 2022-07-03 DIAGNOSIS — Z992 Dependence on renal dialysis: Secondary | ICD-10-CM | POA: Diagnosis not present

## 2022-07-03 DIAGNOSIS — D689 Coagulation defect, unspecified: Secondary | ICD-10-CM | POA: Diagnosis not present

## 2022-07-03 DIAGNOSIS — E876 Hypokalemia: Secondary | ICD-10-CM | POA: Diagnosis not present

## 2022-07-03 DIAGNOSIS — D631 Anemia in chronic kidney disease: Secondary | ICD-10-CM | POA: Diagnosis not present

## 2022-07-05 DIAGNOSIS — N186 End stage renal disease: Secondary | ICD-10-CM | POA: Diagnosis not present

## 2022-07-05 DIAGNOSIS — D689 Coagulation defect, unspecified: Secondary | ICD-10-CM | POA: Diagnosis not present

## 2022-07-05 DIAGNOSIS — E876 Hypokalemia: Secondary | ICD-10-CM | POA: Diagnosis not present

## 2022-07-05 DIAGNOSIS — Z992 Dependence on renal dialysis: Secondary | ICD-10-CM | POA: Diagnosis not present

## 2022-07-05 DIAGNOSIS — D631 Anemia in chronic kidney disease: Secondary | ICD-10-CM | POA: Diagnosis not present

## 2022-07-07 DIAGNOSIS — Z992 Dependence on renal dialysis: Secondary | ICD-10-CM | POA: Diagnosis not present

## 2022-07-07 DIAGNOSIS — N186 End stage renal disease: Secondary | ICD-10-CM | POA: Diagnosis not present

## 2022-07-07 DIAGNOSIS — E876 Hypokalemia: Secondary | ICD-10-CM | POA: Diagnosis not present

## 2022-07-07 DIAGNOSIS — D689 Coagulation defect, unspecified: Secondary | ICD-10-CM | POA: Diagnosis not present

## 2022-07-07 DIAGNOSIS — D631 Anemia in chronic kidney disease: Secondary | ICD-10-CM | POA: Diagnosis not present

## 2022-07-11 DIAGNOSIS — Z992 Dependence on renal dialysis: Secondary | ICD-10-CM | POA: Diagnosis not present

## 2022-07-11 DIAGNOSIS — N186 End stage renal disease: Secondary | ICD-10-CM | POA: Diagnosis not present

## 2022-07-11 DIAGNOSIS — D689 Coagulation defect, unspecified: Secondary | ICD-10-CM | POA: Diagnosis not present

## 2022-07-11 DIAGNOSIS — N2581 Secondary hyperparathyroidism of renal origin: Secondary | ICD-10-CM | POA: Diagnosis not present

## 2022-07-13 DIAGNOSIS — E876 Hypokalemia: Secondary | ICD-10-CM | POA: Diagnosis not present

## 2022-07-13 DIAGNOSIS — D689 Coagulation defect, unspecified: Secondary | ICD-10-CM | POA: Diagnosis not present

## 2022-07-13 DIAGNOSIS — Z992 Dependence on renal dialysis: Secondary | ICD-10-CM | POA: Diagnosis not present

## 2022-07-13 DIAGNOSIS — D631 Anemia in chronic kidney disease: Secondary | ICD-10-CM | POA: Diagnosis not present

## 2022-07-13 DIAGNOSIS — N186 End stage renal disease: Secondary | ICD-10-CM | POA: Diagnosis not present

## 2022-07-14 DIAGNOSIS — D631 Anemia in chronic kidney disease: Secondary | ICD-10-CM | POA: Diagnosis not present

## 2022-07-14 DIAGNOSIS — E876 Hypokalemia: Secondary | ICD-10-CM | POA: Diagnosis not present

## 2022-07-14 DIAGNOSIS — N186 End stage renal disease: Secondary | ICD-10-CM | POA: Diagnosis not present

## 2022-07-14 DIAGNOSIS — Z992 Dependence on renal dialysis: Secondary | ICD-10-CM | POA: Diagnosis not present

## 2022-07-14 DIAGNOSIS — D689 Coagulation defect, unspecified: Secondary | ICD-10-CM | POA: Diagnosis not present

## 2022-07-17 DIAGNOSIS — E876 Hypokalemia: Secondary | ICD-10-CM | POA: Diagnosis not present

## 2022-07-17 DIAGNOSIS — Z992 Dependence on renal dialysis: Secondary | ICD-10-CM | POA: Diagnosis not present

## 2022-07-17 DIAGNOSIS — D689 Coagulation defect, unspecified: Secondary | ICD-10-CM | POA: Diagnosis not present

## 2022-07-17 DIAGNOSIS — N186 End stage renal disease: Secondary | ICD-10-CM | POA: Diagnosis not present

## 2022-07-17 DIAGNOSIS — D631 Anemia in chronic kidney disease: Secondary | ICD-10-CM | POA: Diagnosis not present

## 2022-07-19 DIAGNOSIS — D689 Coagulation defect, unspecified: Secondary | ICD-10-CM | POA: Diagnosis not present

## 2022-07-19 DIAGNOSIS — Z992 Dependence on renal dialysis: Secondary | ICD-10-CM | POA: Diagnosis not present

## 2022-07-19 DIAGNOSIS — E876 Hypokalemia: Secondary | ICD-10-CM | POA: Diagnosis not present

## 2022-07-19 DIAGNOSIS — D631 Anemia in chronic kidney disease: Secondary | ICD-10-CM | POA: Diagnosis not present

## 2022-07-19 DIAGNOSIS — N186 End stage renal disease: Secondary | ICD-10-CM | POA: Diagnosis not present

## 2022-07-21 DIAGNOSIS — N186 End stage renal disease: Secondary | ICD-10-CM | POA: Diagnosis not present

## 2022-07-21 DIAGNOSIS — E876 Hypokalemia: Secondary | ICD-10-CM | POA: Diagnosis not present

## 2022-07-21 DIAGNOSIS — D631 Anemia in chronic kidney disease: Secondary | ICD-10-CM | POA: Diagnosis not present

## 2022-07-21 DIAGNOSIS — Z992 Dependence on renal dialysis: Secondary | ICD-10-CM | POA: Diagnosis not present

## 2022-07-21 DIAGNOSIS — D689 Coagulation defect, unspecified: Secondary | ICD-10-CM | POA: Diagnosis not present

## 2022-07-24 DIAGNOSIS — D631 Anemia in chronic kidney disease: Secondary | ICD-10-CM | POA: Diagnosis not present

## 2022-07-24 DIAGNOSIS — N186 End stage renal disease: Secondary | ICD-10-CM | POA: Diagnosis not present

## 2022-07-24 DIAGNOSIS — Z992 Dependence on renal dialysis: Secondary | ICD-10-CM | POA: Diagnosis not present

## 2022-07-24 DIAGNOSIS — E876 Hypokalemia: Secondary | ICD-10-CM | POA: Diagnosis not present

## 2022-07-24 DIAGNOSIS — D689 Coagulation defect, unspecified: Secondary | ICD-10-CM | POA: Diagnosis not present

## 2022-07-26 DIAGNOSIS — D689 Coagulation defect, unspecified: Secondary | ICD-10-CM | POA: Diagnosis not present

## 2022-07-26 DIAGNOSIS — N186 End stage renal disease: Secondary | ICD-10-CM | POA: Diagnosis not present

## 2022-07-26 DIAGNOSIS — Z992 Dependence on renal dialysis: Secondary | ICD-10-CM | POA: Diagnosis not present

## 2022-07-26 DIAGNOSIS — E876 Hypokalemia: Secondary | ICD-10-CM | POA: Diagnosis not present

## 2022-07-26 DIAGNOSIS — D631 Anemia in chronic kidney disease: Secondary | ICD-10-CM | POA: Diagnosis not present

## 2022-07-27 DIAGNOSIS — E1122 Type 2 diabetes mellitus with diabetic chronic kidney disease: Secondary | ICD-10-CM | POA: Diagnosis not present

## 2022-07-27 DIAGNOSIS — Z992 Dependence on renal dialysis: Secondary | ICD-10-CM | POA: Diagnosis not present

## 2022-07-27 DIAGNOSIS — N186 End stage renal disease: Secondary | ICD-10-CM | POA: Diagnosis not present

## 2022-07-28 DIAGNOSIS — D509 Iron deficiency anemia, unspecified: Secondary | ICD-10-CM | POA: Diagnosis not present

## 2022-07-28 DIAGNOSIS — E876 Hypokalemia: Secondary | ICD-10-CM | POA: Diagnosis not present

## 2022-07-28 DIAGNOSIS — T7840XD Allergy, unspecified, subsequent encounter: Secondary | ICD-10-CM | POA: Diagnosis not present

## 2022-07-28 DIAGNOSIS — Z992 Dependence on renal dialysis: Secondary | ICD-10-CM | POA: Diagnosis not present

## 2022-07-28 DIAGNOSIS — D689 Coagulation defect, unspecified: Secondary | ICD-10-CM | POA: Diagnosis not present

## 2022-07-28 DIAGNOSIS — D631 Anemia in chronic kidney disease: Secondary | ICD-10-CM | POA: Diagnosis not present

## 2022-07-28 DIAGNOSIS — N186 End stage renal disease: Secondary | ICD-10-CM | POA: Diagnosis not present

## 2022-07-31 DIAGNOSIS — Z992 Dependence on renal dialysis: Secondary | ICD-10-CM | POA: Diagnosis not present

## 2022-07-31 DIAGNOSIS — T7840XD Allergy, unspecified, subsequent encounter: Secondary | ICD-10-CM | POA: Diagnosis not present

## 2022-07-31 DIAGNOSIS — E876 Hypokalemia: Secondary | ICD-10-CM | POA: Diagnosis not present

## 2022-07-31 DIAGNOSIS — N186 End stage renal disease: Secondary | ICD-10-CM | POA: Diagnosis not present

## 2022-07-31 DIAGNOSIS — D689 Coagulation defect, unspecified: Secondary | ICD-10-CM | POA: Diagnosis not present

## 2022-07-31 DIAGNOSIS — D509 Iron deficiency anemia, unspecified: Secondary | ICD-10-CM | POA: Diagnosis not present

## 2022-07-31 DIAGNOSIS — D631 Anemia in chronic kidney disease: Secondary | ICD-10-CM | POA: Diagnosis not present

## 2022-08-02 DIAGNOSIS — N186 End stage renal disease: Secondary | ICD-10-CM | POA: Diagnosis not present

## 2022-08-02 DIAGNOSIS — D689 Coagulation defect, unspecified: Secondary | ICD-10-CM | POA: Diagnosis not present

## 2022-08-02 DIAGNOSIS — Z992 Dependence on renal dialysis: Secondary | ICD-10-CM | POA: Diagnosis not present

## 2022-08-02 DIAGNOSIS — E876 Hypokalemia: Secondary | ICD-10-CM | POA: Diagnosis not present

## 2022-08-02 DIAGNOSIS — D631 Anemia in chronic kidney disease: Secondary | ICD-10-CM | POA: Diagnosis not present

## 2022-08-02 DIAGNOSIS — T7840XD Allergy, unspecified, subsequent encounter: Secondary | ICD-10-CM | POA: Diagnosis not present

## 2022-08-02 DIAGNOSIS — D509 Iron deficiency anemia, unspecified: Secondary | ICD-10-CM | POA: Diagnosis not present

## 2022-08-04 DIAGNOSIS — N186 End stage renal disease: Secondary | ICD-10-CM | POA: Diagnosis not present

## 2022-08-04 DIAGNOSIS — D689 Coagulation defect, unspecified: Secondary | ICD-10-CM | POA: Diagnosis not present

## 2022-08-04 DIAGNOSIS — D509 Iron deficiency anemia, unspecified: Secondary | ICD-10-CM | POA: Diagnosis not present

## 2022-08-04 DIAGNOSIS — E876 Hypokalemia: Secondary | ICD-10-CM | POA: Diagnosis not present

## 2022-08-04 DIAGNOSIS — T7840XD Allergy, unspecified, subsequent encounter: Secondary | ICD-10-CM | POA: Diagnosis not present

## 2022-08-04 DIAGNOSIS — D631 Anemia in chronic kidney disease: Secondary | ICD-10-CM | POA: Diagnosis not present

## 2022-08-04 DIAGNOSIS — Z992 Dependence on renal dialysis: Secondary | ICD-10-CM | POA: Diagnosis not present

## 2022-08-07 DIAGNOSIS — N186 End stage renal disease: Secondary | ICD-10-CM | POA: Diagnosis not present

## 2022-08-07 DIAGNOSIS — D631 Anemia in chronic kidney disease: Secondary | ICD-10-CM | POA: Diagnosis not present

## 2022-08-07 DIAGNOSIS — Z992 Dependence on renal dialysis: Secondary | ICD-10-CM | POA: Diagnosis not present

## 2022-08-07 DIAGNOSIS — E876 Hypokalemia: Secondary | ICD-10-CM | POA: Diagnosis not present

## 2022-08-07 DIAGNOSIS — D509 Iron deficiency anemia, unspecified: Secondary | ICD-10-CM | POA: Diagnosis not present

## 2022-08-07 DIAGNOSIS — D689 Coagulation defect, unspecified: Secondary | ICD-10-CM | POA: Diagnosis not present

## 2022-08-07 DIAGNOSIS — T7840XD Allergy, unspecified, subsequent encounter: Secondary | ICD-10-CM | POA: Diagnosis not present

## 2022-08-08 DIAGNOSIS — N186 End stage renal disease: Secondary | ICD-10-CM | POA: Diagnosis not present

## 2022-08-08 DIAGNOSIS — D509 Iron deficiency anemia, unspecified: Secondary | ICD-10-CM | POA: Diagnosis not present

## 2022-08-08 DIAGNOSIS — D689 Coagulation defect, unspecified: Secondary | ICD-10-CM | POA: Diagnosis not present

## 2022-08-08 DIAGNOSIS — Z992 Dependence on renal dialysis: Secondary | ICD-10-CM | POA: Diagnosis not present

## 2022-08-08 DIAGNOSIS — E876 Hypokalemia: Secondary | ICD-10-CM | POA: Diagnosis not present

## 2022-08-08 DIAGNOSIS — D631 Anemia in chronic kidney disease: Secondary | ICD-10-CM | POA: Diagnosis not present

## 2022-08-08 DIAGNOSIS — T7840XD Allergy, unspecified, subsequent encounter: Secondary | ICD-10-CM | POA: Diagnosis not present

## 2022-08-11 DIAGNOSIS — E876 Hypokalemia: Secondary | ICD-10-CM | POA: Diagnosis not present

## 2022-08-11 DIAGNOSIS — T7840XD Allergy, unspecified, subsequent encounter: Secondary | ICD-10-CM | POA: Diagnosis not present

## 2022-08-11 DIAGNOSIS — N186 End stage renal disease: Secondary | ICD-10-CM | POA: Diagnosis not present

## 2022-08-11 DIAGNOSIS — D631 Anemia in chronic kidney disease: Secondary | ICD-10-CM | POA: Diagnosis not present

## 2022-08-11 DIAGNOSIS — Z992 Dependence on renal dialysis: Secondary | ICD-10-CM | POA: Diagnosis not present

## 2022-08-11 DIAGNOSIS — D509 Iron deficiency anemia, unspecified: Secondary | ICD-10-CM | POA: Diagnosis not present

## 2022-08-11 DIAGNOSIS — D689 Coagulation defect, unspecified: Secondary | ICD-10-CM | POA: Diagnosis not present

## 2022-08-14 DIAGNOSIS — D631 Anemia in chronic kidney disease: Secondary | ICD-10-CM | POA: Diagnosis not present

## 2022-08-14 DIAGNOSIS — Z992 Dependence on renal dialysis: Secondary | ICD-10-CM | POA: Diagnosis not present

## 2022-08-14 DIAGNOSIS — T7840XD Allergy, unspecified, subsequent encounter: Secondary | ICD-10-CM | POA: Diagnosis not present

## 2022-08-14 DIAGNOSIS — D509 Iron deficiency anemia, unspecified: Secondary | ICD-10-CM | POA: Diagnosis not present

## 2022-08-14 DIAGNOSIS — D689 Coagulation defect, unspecified: Secondary | ICD-10-CM | POA: Diagnosis not present

## 2022-08-14 DIAGNOSIS — E876 Hypokalemia: Secondary | ICD-10-CM | POA: Diagnosis not present

## 2022-08-14 DIAGNOSIS — N186 End stage renal disease: Secondary | ICD-10-CM | POA: Diagnosis not present

## 2022-08-16 DIAGNOSIS — E876 Hypokalemia: Secondary | ICD-10-CM | POA: Diagnosis not present

## 2022-08-16 DIAGNOSIS — N186 End stage renal disease: Secondary | ICD-10-CM | POA: Diagnosis not present

## 2022-08-16 DIAGNOSIS — Z992 Dependence on renal dialysis: Secondary | ICD-10-CM | POA: Diagnosis not present

## 2022-08-16 DIAGNOSIS — D509 Iron deficiency anemia, unspecified: Secondary | ICD-10-CM | POA: Diagnosis not present

## 2022-08-16 DIAGNOSIS — T7840XD Allergy, unspecified, subsequent encounter: Secondary | ICD-10-CM | POA: Diagnosis not present

## 2022-08-16 DIAGNOSIS — D689 Coagulation defect, unspecified: Secondary | ICD-10-CM | POA: Diagnosis not present

## 2022-08-16 DIAGNOSIS — D631 Anemia in chronic kidney disease: Secondary | ICD-10-CM | POA: Diagnosis not present

## 2022-08-18 DIAGNOSIS — D689 Coagulation defect, unspecified: Secondary | ICD-10-CM | POA: Diagnosis not present

## 2022-08-18 DIAGNOSIS — Z992 Dependence on renal dialysis: Secondary | ICD-10-CM | POA: Diagnosis not present

## 2022-08-18 DIAGNOSIS — D509 Iron deficiency anemia, unspecified: Secondary | ICD-10-CM | POA: Diagnosis not present

## 2022-08-18 DIAGNOSIS — D631 Anemia in chronic kidney disease: Secondary | ICD-10-CM | POA: Diagnosis not present

## 2022-08-18 DIAGNOSIS — T7840XD Allergy, unspecified, subsequent encounter: Secondary | ICD-10-CM | POA: Diagnosis not present

## 2022-08-18 DIAGNOSIS — E876 Hypokalemia: Secondary | ICD-10-CM | POA: Diagnosis not present

## 2022-08-18 DIAGNOSIS — N186 End stage renal disease: Secondary | ICD-10-CM | POA: Diagnosis not present

## 2022-08-21 DIAGNOSIS — N186 End stage renal disease: Secondary | ICD-10-CM | POA: Diagnosis not present

## 2022-08-21 DIAGNOSIS — T7840XD Allergy, unspecified, subsequent encounter: Secondary | ICD-10-CM | POA: Diagnosis not present

## 2022-08-21 DIAGNOSIS — Z992 Dependence on renal dialysis: Secondary | ICD-10-CM | POA: Diagnosis not present

## 2022-08-21 DIAGNOSIS — D631 Anemia in chronic kidney disease: Secondary | ICD-10-CM | POA: Diagnosis not present

## 2022-08-21 DIAGNOSIS — E876 Hypokalemia: Secondary | ICD-10-CM | POA: Diagnosis not present

## 2022-08-21 DIAGNOSIS — D509 Iron deficiency anemia, unspecified: Secondary | ICD-10-CM | POA: Diagnosis not present

## 2022-08-21 DIAGNOSIS — D689 Coagulation defect, unspecified: Secondary | ICD-10-CM | POA: Diagnosis not present

## 2022-08-23 DIAGNOSIS — E876 Hypokalemia: Secondary | ICD-10-CM | POA: Diagnosis not present

## 2022-08-23 DIAGNOSIS — D689 Coagulation defect, unspecified: Secondary | ICD-10-CM | POA: Diagnosis not present

## 2022-08-23 DIAGNOSIS — D631 Anemia in chronic kidney disease: Secondary | ICD-10-CM | POA: Diagnosis not present

## 2022-08-23 DIAGNOSIS — T7840XD Allergy, unspecified, subsequent encounter: Secondary | ICD-10-CM | POA: Diagnosis not present

## 2022-08-23 DIAGNOSIS — D509 Iron deficiency anemia, unspecified: Secondary | ICD-10-CM | POA: Diagnosis not present

## 2022-08-23 DIAGNOSIS — N186 End stage renal disease: Secondary | ICD-10-CM | POA: Diagnosis not present

## 2022-08-23 DIAGNOSIS — Z992 Dependence on renal dialysis: Secondary | ICD-10-CM | POA: Diagnosis not present

## 2022-08-25 DIAGNOSIS — T7840XD Allergy, unspecified, subsequent encounter: Secondary | ICD-10-CM | POA: Diagnosis not present

## 2022-08-25 DIAGNOSIS — Z992 Dependence on renal dialysis: Secondary | ICD-10-CM | POA: Diagnosis not present

## 2022-08-25 DIAGNOSIS — D509 Iron deficiency anemia, unspecified: Secondary | ICD-10-CM | POA: Diagnosis not present

## 2022-08-25 DIAGNOSIS — D689 Coagulation defect, unspecified: Secondary | ICD-10-CM | POA: Diagnosis not present

## 2022-08-25 DIAGNOSIS — D631 Anemia in chronic kidney disease: Secondary | ICD-10-CM | POA: Diagnosis not present

## 2022-08-25 DIAGNOSIS — E876 Hypokalemia: Secondary | ICD-10-CM | POA: Diagnosis not present

## 2022-08-25 DIAGNOSIS — N186 End stage renal disease: Secondary | ICD-10-CM | POA: Diagnosis not present

## 2022-08-26 DIAGNOSIS — N186 End stage renal disease: Secondary | ICD-10-CM | POA: Diagnosis not present

## 2022-08-26 DIAGNOSIS — E1122 Type 2 diabetes mellitus with diabetic chronic kidney disease: Secondary | ICD-10-CM | POA: Diagnosis not present

## 2022-08-26 DIAGNOSIS — Z992 Dependence on renal dialysis: Secondary | ICD-10-CM | POA: Diagnosis not present

## 2022-08-28 DIAGNOSIS — D689 Coagulation defect, unspecified: Secondary | ICD-10-CM | POA: Diagnosis not present

## 2022-08-28 DIAGNOSIS — D509 Iron deficiency anemia, unspecified: Secondary | ICD-10-CM | POA: Diagnosis not present

## 2022-08-28 DIAGNOSIS — D631 Anemia in chronic kidney disease: Secondary | ICD-10-CM | POA: Diagnosis not present

## 2022-08-28 DIAGNOSIS — Z992 Dependence on renal dialysis: Secondary | ICD-10-CM | POA: Diagnosis not present

## 2022-08-28 DIAGNOSIS — Z23 Encounter for immunization: Secondary | ICD-10-CM | POA: Diagnosis not present

## 2022-08-28 DIAGNOSIS — T7840XD Allergy, unspecified, subsequent encounter: Secondary | ICD-10-CM | POA: Diagnosis not present

## 2022-08-28 DIAGNOSIS — N186 End stage renal disease: Secondary | ICD-10-CM | POA: Diagnosis not present

## 2022-08-28 DIAGNOSIS — E876 Hypokalemia: Secondary | ICD-10-CM | POA: Diagnosis not present

## 2022-08-30 DIAGNOSIS — D509 Iron deficiency anemia, unspecified: Secondary | ICD-10-CM | POA: Diagnosis not present

## 2022-08-30 DIAGNOSIS — N186 End stage renal disease: Secondary | ICD-10-CM | POA: Diagnosis not present

## 2022-08-30 DIAGNOSIS — D631 Anemia in chronic kidney disease: Secondary | ICD-10-CM | POA: Diagnosis not present

## 2022-08-30 DIAGNOSIS — Z992 Dependence on renal dialysis: Secondary | ICD-10-CM | POA: Diagnosis not present

## 2022-08-30 DIAGNOSIS — T7840XD Allergy, unspecified, subsequent encounter: Secondary | ICD-10-CM | POA: Diagnosis not present

## 2022-08-30 DIAGNOSIS — Z23 Encounter for immunization: Secondary | ICD-10-CM | POA: Diagnosis not present

## 2022-08-30 DIAGNOSIS — E876 Hypokalemia: Secondary | ICD-10-CM | POA: Diagnosis not present

## 2022-08-30 DIAGNOSIS — D689 Coagulation defect, unspecified: Secondary | ICD-10-CM | POA: Diagnosis not present

## 2022-08-31 DIAGNOSIS — T7840XD Allergy, unspecified, subsequent encounter: Secondary | ICD-10-CM | POA: Diagnosis not present

## 2022-08-31 DIAGNOSIS — E876 Hypokalemia: Secondary | ICD-10-CM | POA: Diagnosis not present

## 2022-08-31 DIAGNOSIS — D509 Iron deficiency anemia, unspecified: Secondary | ICD-10-CM | POA: Diagnosis not present

## 2022-08-31 DIAGNOSIS — N186 End stage renal disease: Secondary | ICD-10-CM | POA: Diagnosis not present

## 2022-08-31 DIAGNOSIS — D689 Coagulation defect, unspecified: Secondary | ICD-10-CM | POA: Diagnosis not present

## 2022-08-31 DIAGNOSIS — Z992 Dependence on renal dialysis: Secondary | ICD-10-CM | POA: Diagnosis not present

## 2022-08-31 DIAGNOSIS — D631 Anemia in chronic kidney disease: Secondary | ICD-10-CM | POA: Diagnosis not present

## 2022-08-31 DIAGNOSIS — Z23 Encounter for immunization: Secondary | ICD-10-CM | POA: Diagnosis not present

## 2022-09-04 DIAGNOSIS — Z23 Encounter for immunization: Secondary | ICD-10-CM | POA: Diagnosis not present

## 2022-09-04 DIAGNOSIS — D509 Iron deficiency anemia, unspecified: Secondary | ICD-10-CM | POA: Diagnosis not present

## 2022-09-04 DIAGNOSIS — T7840XD Allergy, unspecified, subsequent encounter: Secondary | ICD-10-CM | POA: Diagnosis not present

## 2022-09-04 DIAGNOSIS — E876 Hypokalemia: Secondary | ICD-10-CM | POA: Diagnosis not present

## 2022-09-04 DIAGNOSIS — N186 End stage renal disease: Secondary | ICD-10-CM | POA: Diagnosis not present

## 2022-09-04 DIAGNOSIS — Z992 Dependence on renal dialysis: Secondary | ICD-10-CM | POA: Diagnosis not present

## 2022-09-04 DIAGNOSIS — D631 Anemia in chronic kidney disease: Secondary | ICD-10-CM | POA: Diagnosis not present

## 2022-09-04 DIAGNOSIS — D689 Coagulation defect, unspecified: Secondary | ICD-10-CM | POA: Diagnosis not present

## 2022-09-06 DIAGNOSIS — Z992 Dependence on renal dialysis: Secondary | ICD-10-CM | POA: Diagnosis not present

## 2022-09-06 DIAGNOSIS — D631 Anemia in chronic kidney disease: Secondary | ICD-10-CM | POA: Diagnosis not present

## 2022-09-06 DIAGNOSIS — Z23 Encounter for immunization: Secondary | ICD-10-CM | POA: Diagnosis not present

## 2022-09-06 DIAGNOSIS — E876 Hypokalemia: Secondary | ICD-10-CM | POA: Diagnosis not present

## 2022-09-06 DIAGNOSIS — N186 End stage renal disease: Secondary | ICD-10-CM | POA: Diagnosis not present

## 2022-09-06 DIAGNOSIS — D689 Coagulation defect, unspecified: Secondary | ICD-10-CM | POA: Diagnosis not present

## 2022-09-06 DIAGNOSIS — D509 Iron deficiency anemia, unspecified: Secondary | ICD-10-CM | POA: Diagnosis not present

## 2022-09-06 DIAGNOSIS — T7840XD Allergy, unspecified, subsequent encounter: Secondary | ICD-10-CM | POA: Diagnosis not present

## 2022-09-08 DIAGNOSIS — D509 Iron deficiency anemia, unspecified: Secondary | ICD-10-CM | POA: Diagnosis not present

## 2022-09-08 DIAGNOSIS — T7840XD Allergy, unspecified, subsequent encounter: Secondary | ICD-10-CM | POA: Diagnosis not present

## 2022-09-08 DIAGNOSIS — N186 End stage renal disease: Secondary | ICD-10-CM | POA: Diagnosis not present

## 2022-09-08 DIAGNOSIS — D689 Coagulation defect, unspecified: Secondary | ICD-10-CM | POA: Diagnosis not present

## 2022-09-08 DIAGNOSIS — Z992 Dependence on renal dialysis: Secondary | ICD-10-CM | POA: Diagnosis not present

## 2022-09-08 DIAGNOSIS — E876 Hypokalemia: Secondary | ICD-10-CM | POA: Diagnosis not present

## 2022-09-08 DIAGNOSIS — Z23 Encounter for immunization: Secondary | ICD-10-CM | POA: Diagnosis not present

## 2022-09-08 DIAGNOSIS — D631 Anemia in chronic kidney disease: Secondary | ICD-10-CM | POA: Diagnosis not present

## 2022-09-11 DIAGNOSIS — D631 Anemia in chronic kidney disease: Secondary | ICD-10-CM | POA: Diagnosis not present

## 2022-09-11 DIAGNOSIS — E876 Hypokalemia: Secondary | ICD-10-CM | POA: Diagnosis not present

## 2022-09-11 DIAGNOSIS — N186 End stage renal disease: Secondary | ICD-10-CM | POA: Diagnosis not present

## 2022-09-11 DIAGNOSIS — Z992 Dependence on renal dialysis: Secondary | ICD-10-CM | POA: Diagnosis not present

## 2022-09-11 DIAGNOSIS — D509 Iron deficiency anemia, unspecified: Secondary | ICD-10-CM | POA: Diagnosis not present

## 2022-09-11 DIAGNOSIS — T7840XD Allergy, unspecified, subsequent encounter: Secondary | ICD-10-CM | POA: Diagnosis not present

## 2022-09-11 DIAGNOSIS — D689 Coagulation defect, unspecified: Secondary | ICD-10-CM | POA: Diagnosis not present

## 2022-09-11 DIAGNOSIS — Z23 Encounter for immunization: Secondary | ICD-10-CM | POA: Diagnosis not present

## 2022-09-13 DIAGNOSIS — Z992 Dependence on renal dialysis: Secondary | ICD-10-CM | POA: Diagnosis not present

## 2022-09-13 DIAGNOSIS — T7840XD Allergy, unspecified, subsequent encounter: Secondary | ICD-10-CM | POA: Diagnosis not present

## 2022-09-13 DIAGNOSIS — N186 End stage renal disease: Secondary | ICD-10-CM | POA: Diagnosis not present

## 2022-09-13 DIAGNOSIS — D509 Iron deficiency anemia, unspecified: Secondary | ICD-10-CM | POA: Diagnosis not present

## 2022-09-13 DIAGNOSIS — D689 Coagulation defect, unspecified: Secondary | ICD-10-CM | POA: Diagnosis not present

## 2022-09-13 DIAGNOSIS — D631 Anemia in chronic kidney disease: Secondary | ICD-10-CM | POA: Diagnosis not present

## 2022-09-13 DIAGNOSIS — Z23 Encounter for immunization: Secondary | ICD-10-CM | POA: Diagnosis not present

## 2022-09-13 DIAGNOSIS — E876 Hypokalemia: Secondary | ICD-10-CM | POA: Diagnosis not present

## 2022-09-15 DIAGNOSIS — Z0000 Encounter for general adult medical examination without abnormal findings: Secondary | ICD-10-CM | POA: Diagnosis not present

## 2022-09-15 DIAGNOSIS — T7840XD Allergy, unspecified, subsequent encounter: Secondary | ICD-10-CM | POA: Diagnosis not present

## 2022-09-15 DIAGNOSIS — N186 End stage renal disease: Secondary | ICD-10-CM | POA: Diagnosis not present

## 2022-09-15 DIAGNOSIS — Z23 Encounter for immunization: Secondary | ICD-10-CM | POA: Diagnosis not present

## 2022-09-15 DIAGNOSIS — Z992 Dependence on renal dialysis: Secondary | ICD-10-CM | POA: Diagnosis not present

## 2022-09-15 DIAGNOSIS — E785 Hyperlipidemia, unspecified: Secondary | ICD-10-CM | POA: Diagnosis not present

## 2022-09-15 DIAGNOSIS — D509 Iron deficiency anemia, unspecified: Secondary | ICD-10-CM | POA: Diagnosis not present

## 2022-09-15 DIAGNOSIS — D689 Coagulation defect, unspecified: Secondary | ICD-10-CM | POA: Diagnosis not present

## 2022-09-15 DIAGNOSIS — D631 Anemia in chronic kidney disease: Secondary | ICD-10-CM | POA: Diagnosis not present

## 2022-09-15 DIAGNOSIS — I129 Hypertensive chronic kidney disease with stage 1 through stage 4 chronic kidney disease, or unspecified chronic kidney disease: Secondary | ICD-10-CM | POA: Diagnosis not present

## 2022-09-15 DIAGNOSIS — E1169 Type 2 diabetes mellitus with other specified complication: Secondary | ICD-10-CM | POA: Diagnosis not present

## 2022-09-15 DIAGNOSIS — E876 Hypokalemia: Secondary | ICD-10-CM | POA: Diagnosis not present

## 2022-09-18 DIAGNOSIS — E876 Hypokalemia: Secondary | ICD-10-CM | POA: Diagnosis not present

## 2022-09-18 DIAGNOSIS — D631 Anemia in chronic kidney disease: Secondary | ICD-10-CM | POA: Diagnosis not present

## 2022-09-18 DIAGNOSIS — N186 End stage renal disease: Secondary | ICD-10-CM | POA: Diagnosis not present

## 2022-09-18 DIAGNOSIS — D689 Coagulation defect, unspecified: Secondary | ICD-10-CM | POA: Diagnosis not present

## 2022-09-18 DIAGNOSIS — D509 Iron deficiency anemia, unspecified: Secondary | ICD-10-CM | POA: Diagnosis not present

## 2022-09-18 DIAGNOSIS — Z992 Dependence on renal dialysis: Secondary | ICD-10-CM | POA: Diagnosis not present

## 2022-09-18 DIAGNOSIS — T7840XD Allergy, unspecified, subsequent encounter: Secondary | ICD-10-CM | POA: Diagnosis not present

## 2022-09-18 DIAGNOSIS — Z23 Encounter for immunization: Secondary | ICD-10-CM | POA: Diagnosis not present

## 2022-09-20 DIAGNOSIS — D689 Coagulation defect, unspecified: Secondary | ICD-10-CM | POA: Diagnosis not present

## 2022-09-20 DIAGNOSIS — D509 Iron deficiency anemia, unspecified: Secondary | ICD-10-CM | POA: Diagnosis not present

## 2022-09-20 DIAGNOSIS — Z23 Encounter for immunization: Secondary | ICD-10-CM | POA: Diagnosis not present

## 2022-09-20 DIAGNOSIS — T7840XD Allergy, unspecified, subsequent encounter: Secondary | ICD-10-CM | POA: Diagnosis not present

## 2022-09-20 DIAGNOSIS — E876 Hypokalemia: Secondary | ICD-10-CM | POA: Diagnosis not present

## 2022-09-20 DIAGNOSIS — Z992 Dependence on renal dialysis: Secondary | ICD-10-CM | POA: Diagnosis not present

## 2022-09-20 DIAGNOSIS — D631 Anemia in chronic kidney disease: Secondary | ICD-10-CM | POA: Diagnosis not present

## 2022-09-20 DIAGNOSIS — N186 End stage renal disease: Secondary | ICD-10-CM | POA: Diagnosis not present

## 2022-09-22 DIAGNOSIS — N186 End stage renal disease: Secondary | ICD-10-CM | POA: Diagnosis not present

## 2022-09-22 DIAGNOSIS — Z23 Encounter for immunization: Secondary | ICD-10-CM | POA: Diagnosis not present

## 2022-09-22 DIAGNOSIS — D631 Anemia in chronic kidney disease: Secondary | ICD-10-CM | POA: Diagnosis not present

## 2022-09-22 DIAGNOSIS — E876 Hypokalemia: Secondary | ICD-10-CM | POA: Diagnosis not present

## 2022-09-22 DIAGNOSIS — T7840XD Allergy, unspecified, subsequent encounter: Secondary | ICD-10-CM | POA: Diagnosis not present

## 2022-09-22 DIAGNOSIS — Z992 Dependence on renal dialysis: Secondary | ICD-10-CM | POA: Diagnosis not present

## 2022-09-22 DIAGNOSIS — D689 Coagulation defect, unspecified: Secondary | ICD-10-CM | POA: Diagnosis not present

## 2022-09-22 DIAGNOSIS — D509 Iron deficiency anemia, unspecified: Secondary | ICD-10-CM | POA: Diagnosis not present

## 2022-09-25 DIAGNOSIS — T7840XD Allergy, unspecified, subsequent encounter: Secondary | ICD-10-CM | POA: Diagnosis not present

## 2022-09-25 DIAGNOSIS — Z992 Dependence on renal dialysis: Secondary | ICD-10-CM | POA: Diagnosis not present

## 2022-09-25 DIAGNOSIS — D631 Anemia in chronic kidney disease: Secondary | ICD-10-CM | POA: Diagnosis not present

## 2022-09-25 DIAGNOSIS — N186 End stage renal disease: Secondary | ICD-10-CM | POA: Diagnosis not present

## 2022-09-25 DIAGNOSIS — E876 Hypokalemia: Secondary | ICD-10-CM | POA: Diagnosis not present

## 2022-09-25 DIAGNOSIS — D689 Coagulation defect, unspecified: Secondary | ICD-10-CM | POA: Diagnosis not present

## 2022-09-25 DIAGNOSIS — Z23 Encounter for immunization: Secondary | ICD-10-CM | POA: Diagnosis not present

## 2022-09-25 DIAGNOSIS — D509 Iron deficiency anemia, unspecified: Secondary | ICD-10-CM | POA: Diagnosis not present

## 2022-09-26 DIAGNOSIS — Z992 Dependence on renal dialysis: Secondary | ICD-10-CM | POA: Diagnosis not present

## 2022-09-26 DIAGNOSIS — N186 End stage renal disease: Secondary | ICD-10-CM | POA: Diagnosis not present

## 2022-09-26 DIAGNOSIS — E1122 Type 2 diabetes mellitus with diabetic chronic kidney disease: Secondary | ICD-10-CM | POA: Diagnosis not present

## 2022-09-27 DIAGNOSIS — D689 Coagulation defect, unspecified: Secondary | ICD-10-CM | POA: Diagnosis not present

## 2022-09-27 DIAGNOSIS — E876 Hypokalemia: Secondary | ICD-10-CM | POA: Diagnosis not present

## 2022-09-27 DIAGNOSIS — N186 End stage renal disease: Secondary | ICD-10-CM | POA: Diagnosis not present

## 2022-09-27 DIAGNOSIS — Z992 Dependence on renal dialysis: Secondary | ICD-10-CM | POA: Diagnosis not present

## 2022-09-27 DIAGNOSIS — D631 Anemia in chronic kidney disease: Secondary | ICD-10-CM | POA: Diagnosis not present

## 2022-09-27 DIAGNOSIS — D509 Iron deficiency anemia, unspecified: Secondary | ICD-10-CM | POA: Diagnosis not present

## 2022-09-27 DIAGNOSIS — T7840XD Allergy, unspecified, subsequent encounter: Secondary | ICD-10-CM | POA: Diagnosis not present

## 2022-09-29 DIAGNOSIS — E876 Hypokalemia: Secondary | ICD-10-CM | POA: Diagnosis not present

## 2022-09-29 DIAGNOSIS — D689 Coagulation defect, unspecified: Secondary | ICD-10-CM | POA: Diagnosis not present

## 2022-09-29 DIAGNOSIS — Z992 Dependence on renal dialysis: Secondary | ICD-10-CM | POA: Diagnosis not present

## 2022-09-29 DIAGNOSIS — D631 Anemia in chronic kidney disease: Secondary | ICD-10-CM | POA: Diagnosis not present

## 2022-09-29 DIAGNOSIS — N186 End stage renal disease: Secondary | ICD-10-CM | POA: Diagnosis not present

## 2022-09-29 DIAGNOSIS — D509 Iron deficiency anemia, unspecified: Secondary | ICD-10-CM | POA: Diagnosis not present

## 2022-09-29 DIAGNOSIS — T7840XD Allergy, unspecified, subsequent encounter: Secondary | ICD-10-CM | POA: Diagnosis not present

## 2022-10-02 DIAGNOSIS — E876 Hypokalemia: Secondary | ICD-10-CM | POA: Diagnosis not present

## 2022-10-02 DIAGNOSIS — D509 Iron deficiency anemia, unspecified: Secondary | ICD-10-CM | POA: Diagnosis not present

## 2022-10-02 DIAGNOSIS — T7840XD Allergy, unspecified, subsequent encounter: Secondary | ICD-10-CM | POA: Diagnosis not present

## 2022-10-02 DIAGNOSIS — D689 Coagulation defect, unspecified: Secondary | ICD-10-CM | POA: Diagnosis not present

## 2022-10-02 DIAGNOSIS — D631 Anemia in chronic kidney disease: Secondary | ICD-10-CM | POA: Diagnosis not present

## 2022-10-02 DIAGNOSIS — N186 End stage renal disease: Secondary | ICD-10-CM | POA: Diagnosis not present

## 2022-10-02 DIAGNOSIS — Z992 Dependence on renal dialysis: Secondary | ICD-10-CM | POA: Diagnosis not present

## 2022-10-04 DIAGNOSIS — D631 Anemia in chronic kidney disease: Secondary | ICD-10-CM | POA: Diagnosis not present

## 2022-10-04 DIAGNOSIS — D689 Coagulation defect, unspecified: Secondary | ICD-10-CM | POA: Diagnosis not present

## 2022-10-04 DIAGNOSIS — N186 End stage renal disease: Secondary | ICD-10-CM | POA: Diagnosis not present

## 2022-10-04 DIAGNOSIS — D509 Iron deficiency anemia, unspecified: Secondary | ICD-10-CM | POA: Diagnosis not present

## 2022-10-04 DIAGNOSIS — Z992 Dependence on renal dialysis: Secondary | ICD-10-CM | POA: Diagnosis not present

## 2022-10-04 DIAGNOSIS — E876 Hypokalemia: Secondary | ICD-10-CM | POA: Diagnosis not present

## 2022-10-04 DIAGNOSIS — T7840XD Allergy, unspecified, subsequent encounter: Secondary | ICD-10-CM | POA: Diagnosis not present

## 2022-10-05 DIAGNOSIS — E1122 Type 2 diabetes mellitus with diabetic chronic kidney disease: Secondary | ICD-10-CM | POA: Diagnosis not present

## 2022-10-05 DIAGNOSIS — I129 Hypertensive chronic kidney disease with stage 1 through stage 4 chronic kidney disease, or unspecified chronic kidney disease: Secondary | ICD-10-CM | POA: Diagnosis not present

## 2022-10-05 DIAGNOSIS — N186 End stage renal disease: Secondary | ICD-10-CM | POA: Diagnosis not present

## 2022-10-06 DIAGNOSIS — T7840XD Allergy, unspecified, subsequent encounter: Secondary | ICD-10-CM | POA: Diagnosis not present

## 2022-10-06 DIAGNOSIS — N186 End stage renal disease: Secondary | ICD-10-CM | POA: Diagnosis not present

## 2022-10-06 DIAGNOSIS — D631 Anemia in chronic kidney disease: Secondary | ICD-10-CM | POA: Diagnosis not present

## 2022-10-06 DIAGNOSIS — D509 Iron deficiency anemia, unspecified: Secondary | ICD-10-CM | POA: Diagnosis not present

## 2022-10-06 DIAGNOSIS — E876 Hypokalemia: Secondary | ICD-10-CM | POA: Diagnosis not present

## 2022-10-06 DIAGNOSIS — D689 Coagulation defect, unspecified: Secondary | ICD-10-CM | POA: Diagnosis not present

## 2022-10-06 DIAGNOSIS — Z992 Dependence on renal dialysis: Secondary | ICD-10-CM | POA: Diagnosis not present

## 2022-10-09 DIAGNOSIS — D631 Anemia in chronic kidney disease: Secondary | ICD-10-CM | POA: Diagnosis not present

## 2022-10-09 DIAGNOSIS — T7840XD Allergy, unspecified, subsequent encounter: Secondary | ICD-10-CM | POA: Diagnosis not present

## 2022-10-09 DIAGNOSIS — N186 End stage renal disease: Secondary | ICD-10-CM | POA: Diagnosis not present

## 2022-10-09 DIAGNOSIS — Z992 Dependence on renal dialysis: Secondary | ICD-10-CM | POA: Diagnosis not present

## 2022-10-09 DIAGNOSIS — E876 Hypokalemia: Secondary | ICD-10-CM | POA: Diagnosis not present

## 2022-10-09 DIAGNOSIS — D689 Coagulation defect, unspecified: Secondary | ICD-10-CM | POA: Diagnosis not present

## 2022-10-09 DIAGNOSIS — D509 Iron deficiency anemia, unspecified: Secondary | ICD-10-CM | POA: Diagnosis not present

## 2022-10-11 DIAGNOSIS — D631 Anemia in chronic kidney disease: Secondary | ICD-10-CM | POA: Diagnosis not present

## 2022-10-11 DIAGNOSIS — E876 Hypokalemia: Secondary | ICD-10-CM | POA: Diagnosis not present

## 2022-10-11 DIAGNOSIS — D509 Iron deficiency anemia, unspecified: Secondary | ICD-10-CM | POA: Diagnosis not present

## 2022-10-11 DIAGNOSIS — Z992 Dependence on renal dialysis: Secondary | ICD-10-CM | POA: Diagnosis not present

## 2022-10-11 DIAGNOSIS — N186 End stage renal disease: Secondary | ICD-10-CM | POA: Diagnosis not present

## 2022-10-11 DIAGNOSIS — D689 Coagulation defect, unspecified: Secondary | ICD-10-CM | POA: Diagnosis not present

## 2022-10-11 DIAGNOSIS — T7840XD Allergy, unspecified, subsequent encounter: Secondary | ICD-10-CM | POA: Diagnosis not present

## 2022-10-13 DIAGNOSIS — N186 End stage renal disease: Secondary | ICD-10-CM | POA: Diagnosis not present

## 2022-10-13 DIAGNOSIS — E876 Hypokalemia: Secondary | ICD-10-CM | POA: Diagnosis not present

## 2022-10-13 DIAGNOSIS — D689 Coagulation defect, unspecified: Secondary | ICD-10-CM | POA: Diagnosis not present

## 2022-10-13 DIAGNOSIS — D509 Iron deficiency anemia, unspecified: Secondary | ICD-10-CM | POA: Diagnosis not present

## 2022-10-13 DIAGNOSIS — T7840XD Allergy, unspecified, subsequent encounter: Secondary | ICD-10-CM | POA: Diagnosis not present

## 2022-10-13 DIAGNOSIS — Z992 Dependence on renal dialysis: Secondary | ICD-10-CM | POA: Diagnosis not present

## 2022-10-13 DIAGNOSIS — D631 Anemia in chronic kidney disease: Secondary | ICD-10-CM | POA: Diagnosis not present

## 2022-10-15 DIAGNOSIS — N186 End stage renal disease: Secondary | ICD-10-CM | POA: Diagnosis not present

## 2022-10-15 DIAGNOSIS — Z992 Dependence on renal dialysis: Secondary | ICD-10-CM | POA: Diagnosis not present

## 2022-10-15 DIAGNOSIS — D631 Anemia in chronic kidney disease: Secondary | ICD-10-CM | POA: Diagnosis not present

## 2022-10-15 DIAGNOSIS — T7840XD Allergy, unspecified, subsequent encounter: Secondary | ICD-10-CM | POA: Diagnosis not present

## 2022-10-15 DIAGNOSIS — E876 Hypokalemia: Secondary | ICD-10-CM | POA: Diagnosis not present

## 2022-10-15 DIAGNOSIS — D689 Coagulation defect, unspecified: Secondary | ICD-10-CM | POA: Diagnosis not present

## 2022-10-15 DIAGNOSIS — D509 Iron deficiency anemia, unspecified: Secondary | ICD-10-CM | POA: Diagnosis not present

## 2022-10-17 DIAGNOSIS — N186 End stage renal disease: Secondary | ICD-10-CM | POA: Diagnosis not present

## 2022-10-17 DIAGNOSIS — D631 Anemia in chronic kidney disease: Secondary | ICD-10-CM | POA: Diagnosis not present

## 2022-10-17 DIAGNOSIS — D689 Coagulation defect, unspecified: Secondary | ICD-10-CM | POA: Diagnosis not present

## 2022-10-17 DIAGNOSIS — E876 Hypokalemia: Secondary | ICD-10-CM | POA: Diagnosis not present

## 2022-10-17 DIAGNOSIS — D509 Iron deficiency anemia, unspecified: Secondary | ICD-10-CM | POA: Diagnosis not present

## 2022-10-17 DIAGNOSIS — T7840XD Allergy, unspecified, subsequent encounter: Secondary | ICD-10-CM | POA: Diagnosis not present

## 2022-10-17 DIAGNOSIS — Z992 Dependence on renal dialysis: Secondary | ICD-10-CM | POA: Diagnosis not present

## 2022-10-21 DIAGNOSIS — D689 Coagulation defect, unspecified: Secondary | ICD-10-CM | POA: Diagnosis not present

## 2022-10-21 DIAGNOSIS — E876 Hypokalemia: Secondary | ICD-10-CM | POA: Diagnosis not present

## 2022-10-21 DIAGNOSIS — D509 Iron deficiency anemia, unspecified: Secondary | ICD-10-CM | POA: Diagnosis not present

## 2022-10-21 DIAGNOSIS — Z992 Dependence on renal dialysis: Secondary | ICD-10-CM | POA: Diagnosis not present

## 2022-10-21 DIAGNOSIS — D631 Anemia in chronic kidney disease: Secondary | ICD-10-CM | POA: Diagnosis not present

## 2022-10-21 DIAGNOSIS — T7840XD Allergy, unspecified, subsequent encounter: Secondary | ICD-10-CM | POA: Diagnosis not present

## 2022-10-21 DIAGNOSIS — N186 End stage renal disease: Secondary | ICD-10-CM | POA: Diagnosis not present

## 2022-10-23 DIAGNOSIS — E876 Hypokalemia: Secondary | ICD-10-CM | POA: Diagnosis not present

## 2022-10-23 DIAGNOSIS — T7840XD Allergy, unspecified, subsequent encounter: Secondary | ICD-10-CM | POA: Diagnosis not present

## 2022-10-23 DIAGNOSIS — D689 Coagulation defect, unspecified: Secondary | ICD-10-CM | POA: Diagnosis not present

## 2022-10-23 DIAGNOSIS — N186 End stage renal disease: Secondary | ICD-10-CM | POA: Diagnosis not present

## 2022-10-23 DIAGNOSIS — D509 Iron deficiency anemia, unspecified: Secondary | ICD-10-CM | POA: Diagnosis not present

## 2022-10-23 DIAGNOSIS — D631 Anemia in chronic kidney disease: Secondary | ICD-10-CM | POA: Diagnosis not present

## 2022-10-23 DIAGNOSIS — Z992 Dependence on renal dialysis: Secondary | ICD-10-CM | POA: Diagnosis not present

## 2022-10-25 DIAGNOSIS — Z992 Dependence on renal dialysis: Secondary | ICD-10-CM | POA: Diagnosis not present

## 2022-10-25 DIAGNOSIS — D509 Iron deficiency anemia, unspecified: Secondary | ICD-10-CM | POA: Diagnosis not present

## 2022-10-25 DIAGNOSIS — N186 End stage renal disease: Secondary | ICD-10-CM | POA: Diagnosis not present

## 2022-10-25 DIAGNOSIS — D631 Anemia in chronic kidney disease: Secondary | ICD-10-CM | POA: Diagnosis not present

## 2022-10-25 DIAGNOSIS — E876 Hypokalemia: Secondary | ICD-10-CM | POA: Diagnosis not present

## 2022-10-25 DIAGNOSIS — D689 Coagulation defect, unspecified: Secondary | ICD-10-CM | POA: Diagnosis not present

## 2022-10-25 DIAGNOSIS — T7840XD Allergy, unspecified, subsequent encounter: Secondary | ICD-10-CM | POA: Diagnosis not present

## 2022-10-26 DIAGNOSIS — E1122 Type 2 diabetes mellitus with diabetic chronic kidney disease: Secondary | ICD-10-CM | POA: Diagnosis not present

## 2022-10-26 DIAGNOSIS — Z992 Dependence on renal dialysis: Secondary | ICD-10-CM | POA: Diagnosis not present

## 2022-10-26 DIAGNOSIS — N186 End stage renal disease: Secondary | ICD-10-CM | POA: Diagnosis not present

## 2022-10-27 DIAGNOSIS — E876 Hypokalemia: Secondary | ICD-10-CM | POA: Diagnosis not present

## 2022-10-27 DIAGNOSIS — N186 End stage renal disease: Secondary | ICD-10-CM | POA: Diagnosis not present

## 2022-10-27 DIAGNOSIS — T7840XD Allergy, unspecified, subsequent encounter: Secondary | ICD-10-CM | POA: Diagnosis not present

## 2022-10-27 DIAGNOSIS — D689 Coagulation defect, unspecified: Secondary | ICD-10-CM | POA: Diagnosis not present

## 2022-10-27 DIAGNOSIS — Z992 Dependence on renal dialysis: Secondary | ICD-10-CM | POA: Diagnosis not present

## 2022-10-27 DIAGNOSIS — D631 Anemia in chronic kidney disease: Secondary | ICD-10-CM | POA: Diagnosis not present

## 2022-10-30 DIAGNOSIS — Z992 Dependence on renal dialysis: Secondary | ICD-10-CM | POA: Diagnosis not present

## 2022-10-30 DIAGNOSIS — T7840XD Allergy, unspecified, subsequent encounter: Secondary | ICD-10-CM | POA: Diagnosis not present

## 2022-10-30 DIAGNOSIS — N186 End stage renal disease: Secondary | ICD-10-CM | POA: Diagnosis not present

## 2022-10-30 DIAGNOSIS — D631 Anemia in chronic kidney disease: Secondary | ICD-10-CM | POA: Diagnosis not present

## 2022-10-30 DIAGNOSIS — D689 Coagulation defect, unspecified: Secondary | ICD-10-CM | POA: Diagnosis not present

## 2022-10-30 DIAGNOSIS — E876 Hypokalemia: Secondary | ICD-10-CM | POA: Diagnosis not present

## 2022-11-01 DIAGNOSIS — N186 End stage renal disease: Secondary | ICD-10-CM | POA: Diagnosis not present

## 2022-11-01 DIAGNOSIS — E876 Hypokalemia: Secondary | ICD-10-CM | POA: Diagnosis not present

## 2022-11-01 DIAGNOSIS — D631 Anemia in chronic kidney disease: Secondary | ICD-10-CM | POA: Diagnosis not present

## 2022-11-01 DIAGNOSIS — D689 Coagulation defect, unspecified: Secondary | ICD-10-CM | POA: Diagnosis not present

## 2022-11-01 DIAGNOSIS — Z992 Dependence on renal dialysis: Secondary | ICD-10-CM | POA: Diagnosis not present

## 2022-11-01 DIAGNOSIS — T7840XD Allergy, unspecified, subsequent encounter: Secondary | ICD-10-CM | POA: Diagnosis not present

## 2022-11-04 DIAGNOSIS — D689 Coagulation defect, unspecified: Secondary | ICD-10-CM | POA: Diagnosis not present

## 2022-11-04 DIAGNOSIS — Z992 Dependence on renal dialysis: Secondary | ICD-10-CM | POA: Diagnosis not present

## 2022-11-04 DIAGNOSIS — N186 End stage renal disease: Secondary | ICD-10-CM | POA: Diagnosis not present

## 2022-11-04 DIAGNOSIS — T7840XD Allergy, unspecified, subsequent encounter: Secondary | ICD-10-CM | POA: Diagnosis not present

## 2022-11-04 DIAGNOSIS — D631 Anemia in chronic kidney disease: Secondary | ICD-10-CM | POA: Diagnosis not present

## 2022-11-04 DIAGNOSIS — E876 Hypokalemia: Secondary | ICD-10-CM | POA: Diagnosis not present

## 2022-11-06 DIAGNOSIS — Z992 Dependence on renal dialysis: Secondary | ICD-10-CM | POA: Diagnosis not present

## 2022-11-06 DIAGNOSIS — N186 End stage renal disease: Secondary | ICD-10-CM | POA: Diagnosis not present

## 2022-11-06 DIAGNOSIS — T7840XD Allergy, unspecified, subsequent encounter: Secondary | ICD-10-CM | POA: Diagnosis not present

## 2022-11-06 DIAGNOSIS — E876 Hypokalemia: Secondary | ICD-10-CM | POA: Diagnosis not present

## 2022-11-06 DIAGNOSIS — D631 Anemia in chronic kidney disease: Secondary | ICD-10-CM | POA: Diagnosis not present

## 2022-11-06 DIAGNOSIS — D689 Coagulation defect, unspecified: Secondary | ICD-10-CM | POA: Diagnosis not present

## 2022-11-08 DIAGNOSIS — D631 Anemia in chronic kidney disease: Secondary | ICD-10-CM | POA: Diagnosis not present

## 2022-11-08 DIAGNOSIS — D689 Coagulation defect, unspecified: Secondary | ICD-10-CM | POA: Diagnosis not present

## 2022-11-08 DIAGNOSIS — Z992 Dependence on renal dialysis: Secondary | ICD-10-CM | POA: Diagnosis not present

## 2022-11-08 DIAGNOSIS — E876 Hypokalemia: Secondary | ICD-10-CM | POA: Diagnosis not present

## 2022-11-08 DIAGNOSIS — T7840XD Allergy, unspecified, subsequent encounter: Secondary | ICD-10-CM | POA: Diagnosis not present

## 2022-11-08 DIAGNOSIS — N186 End stage renal disease: Secondary | ICD-10-CM | POA: Diagnosis not present

## 2022-11-09 DIAGNOSIS — Z1231 Encounter for screening mammogram for malignant neoplasm of breast: Secondary | ICD-10-CM | POA: Diagnosis not present

## 2022-11-09 DIAGNOSIS — Z780 Asymptomatic menopausal state: Secondary | ICD-10-CM | POA: Diagnosis not present

## 2022-11-09 DIAGNOSIS — M810 Age-related osteoporosis without current pathological fracture: Secondary | ICD-10-CM | POA: Diagnosis not present

## 2022-11-10 DIAGNOSIS — D631 Anemia in chronic kidney disease: Secondary | ICD-10-CM | POA: Diagnosis not present

## 2022-11-10 DIAGNOSIS — E876 Hypokalemia: Secondary | ICD-10-CM | POA: Diagnosis not present

## 2022-11-10 DIAGNOSIS — Z992 Dependence on renal dialysis: Secondary | ICD-10-CM | POA: Diagnosis not present

## 2022-11-10 DIAGNOSIS — D689 Coagulation defect, unspecified: Secondary | ICD-10-CM | POA: Diagnosis not present

## 2022-11-10 DIAGNOSIS — T7840XD Allergy, unspecified, subsequent encounter: Secondary | ICD-10-CM | POA: Diagnosis not present

## 2022-11-10 DIAGNOSIS — N186 End stage renal disease: Secondary | ICD-10-CM | POA: Diagnosis not present

## 2022-11-13 DIAGNOSIS — E876 Hypokalemia: Secondary | ICD-10-CM | POA: Diagnosis not present

## 2022-11-13 DIAGNOSIS — N186 End stage renal disease: Secondary | ICD-10-CM | POA: Diagnosis not present

## 2022-11-13 DIAGNOSIS — D689 Coagulation defect, unspecified: Secondary | ICD-10-CM | POA: Diagnosis not present

## 2022-11-13 DIAGNOSIS — Z992 Dependence on renal dialysis: Secondary | ICD-10-CM | POA: Diagnosis not present

## 2022-11-13 DIAGNOSIS — T7840XD Allergy, unspecified, subsequent encounter: Secondary | ICD-10-CM | POA: Diagnosis not present

## 2022-11-13 DIAGNOSIS — D631 Anemia in chronic kidney disease: Secondary | ICD-10-CM | POA: Diagnosis not present

## 2022-11-15 DIAGNOSIS — N186 End stage renal disease: Secondary | ICD-10-CM | POA: Diagnosis not present

## 2022-11-15 DIAGNOSIS — Z992 Dependence on renal dialysis: Secondary | ICD-10-CM | POA: Diagnosis not present

## 2022-11-15 DIAGNOSIS — T7840XD Allergy, unspecified, subsequent encounter: Secondary | ICD-10-CM | POA: Diagnosis not present

## 2022-11-15 DIAGNOSIS — D631 Anemia in chronic kidney disease: Secondary | ICD-10-CM | POA: Diagnosis not present

## 2022-11-15 DIAGNOSIS — D689 Coagulation defect, unspecified: Secondary | ICD-10-CM | POA: Diagnosis not present

## 2022-11-15 DIAGNOSIS — E876 Hypokalemia: Secondary | ICD-10-CM | POA: Diagnosis not present

## 2022-11-17 DIAGNOSIS — E876 Hypokalemia: Secondary | ICD-10-CM | POA: Diagnosis not present

## 2022-11-17 DIAGNOSIS — T7840XD Allergy, unspecified, subsequent encounter: Secondary | ICD-10-CM | POA: Diagnosis not present

## 2022-11-17 DIAGNOSIS — N186 End stage renal disease: Secondary | ICD-10-CM | POA: Diagnosis not present

## 2022-11-17 DIAGNOSIS — D689 Coagulation defect, unspecified: Secondary | ICD-10-CM | POA: Diagnosis not present

## 2022-11-17 DIAGNOSIS — D631 Anemia in chronic kidney disease: Secondary | ICD-10-CM | POA: Diagnosis not present

## 2022-11-17 DIAGNOSIS — Z992 Dependence on renal dialysis: Secondary | ICD-10-CM | POA: Diagnosis not present

## 2022-11-19 DIAGNOSIS — T7840XD Allergy, unspecified, subsequent encounter: Secondary | ICD-10-CM | POA: Diagnosis not present

## 2022-11-19 DIAGNOSIS — D689 Coagulation defect, unspecified: Secondary | ICD-10-CM | POA: Diagnosis not present

## 2022-11-19 DIAGNOSIS — E876 Hypokalemia: Secondary | ICD-10-CM | POA: Diagnosis not present

## 2022-11-19 DIAGNOSIS — Z992 Dependence on renal dialysis: Secondary | ICD-10-CM | POA: Diagnosis not present

## 2022-11-19 DIAGNOSIS — D631 Anemia in chronic kidney disease: Secondary | ICD-10-CM | POA: Diagnosis not present

## 2022-11-19 DIAGNOSIS — N186 End stage renal disease: Secondary | ICD-10-CM | POA: Diagnosis not present

## 2022-11-22 DIAGNOSIS — D631 Anemia in chronic kidney disease: Secondary | ICD-10-CM | POA: Diagnosis not present

## 2022-11-22 DIAGNOSIS — E876 Hypokalemia: Secondary | ICD-10-CM | POA: Diagnosis not present

## 2022-11-22 DIAGNOSIS — T7840XD Allergy, unspecified, subsequent encounter: Secondary | ICD-10-CM | POA: Diagnosis not present

## 2022-11-22 DIAGNOSIS — N186 End stage renal disease: Secondary | ICD-10-CM | POA: Diagnosis not present

## 2022-11-22 DIAGNOSIS — Z992 Dependence on renal dialysis: Secondary | ICD-10-CM | POA: Diagnosis not present

## 2022-11-22 DIAGNOSIS — D689 Coagulation defect, unspecified: Secondary | ICD-10-CM | POA: Diagnosis not present

## 2022-11-24 DIAGNOSIS — D631 Anemia in chronic kidney disease: Secondary | ICD-10-CM | POA: Diagnosis not present

## 2022-11-24 DIAGNOSIS — D689 Coagulation defect, unspecified: Secondary | ICD-10-CM | POA: Diagnosis not present

## 2022-11-24 DIAGNOSIS — Z992 Dependence on renal dialysis: Secondary | ICD-10-CM | POA: Diagnosis not present

## 2022-11-24 DIAGNOSIS — E876 Hypokalemia: Secondary | ICD-10-CM | POA: Diagnosis not present

## 2022-11-24 DIAGNOSIS — N186 End stage renal disease: Secondary | ICD-10-CM | POA: Diagnosis not present

## 2022-11-24 DIAGNOSIS — T7840XD Allergy, unspecified, subsequent encounter: Secondary | ICD-10-CM | POA: Diagnosis not present

## 2022-11-26 DIAGNOSIS — N186 End stage renal disease: Secondary | ICD-10-CM | POA: Diagnosis not present

## 2022-11-26 DIAGNOSIS — E1122 Type 2 diabetes mellitus with diabetic chronic kidney disease: Secondary | ICD-10-CM | POA: Diagnosis not present

## 2022-11-26 DIAGNOSIS — Z992 Dependence on renal dialysis: Secondary | ICD-10-CM | POA: Diagnosis not present

## 2022-11-28 DIAGNOSIS — Z992 Dependence on renal dialysis: Secondary | ICD-10-CM | POA: Diagnosis not present

## 2022-11-28 DIAGNOSIS — E876 Hypokalemia: Secondary | ICD-10-CM | POA: Diagnosis not present

## 2022-11-28 DIAGNOSIS — D631 Anemia in chronic kidney disease: Secondary | ICD-10-CM | POA: Diagnosis not present

## 2022-11-28 DIAGNOSIS — D689 Coagulation defect, unspecified: Secondary | ICD-10-CM | POA: Diagnosis not present

## 2022-11-28 DIAGNOSIS — N186 End stage renal disease: Secondary | ICD-10-CM | POA: Diagnosis not present

## 2022-11-28 DIAGNOSIS — R519 Headache, unspecified: Secondary | ICD-10-CM | POA: Diagnosis not present

## 2022-11-28 DIAGNOSIS — T7840XD Allergy, unspecified, subsequent encounter: Secondary | ICD-10-CM | POA: Diagnosis not present

## 2022-11-29 DIAGNOSIS — R519 Headache, unspecified: Secondary | ICD-10-CM | POA: Diagnosis not present

## 2022-11-29 DIAGNOSIS — D689 Coagulation defect, unspecified: Secondary | ICD-10-CM | POA: Diagnosis not present

## 2022-11-29 DIAGNOSIS — T7840XD Allergy, unspecified, subsequent encounter: Secondary | ICD-10-CM | POA: Diagnosis not present

## 2022-11-29 DIAGNOSIS — Z992 Dependence on renal dialysis: Secondary | ICD-10-CM | POA: Diagnosis not present

## 2022-11-29 DIAGNOSIS — D631 Anemia in chronic kidney disease: Secondary | ICD-10-CM | POA: Diagnosis not present

## 2022-11-29 DIAGNOSIS — N186 End stage renal disease: Secondary | ICD-10-CM | POA: Diagnosis not present

## 2022-11-29 DIAGNOSIS — E876 Hypokalemia: Secondary | ICD-10-CM | POA: Diagnosis not present

## 2022-12-01 DIAGNOSIS — D689 Coagulation defect, unspecified: Secondary | ICD-10-CM | POA: Diagnosis not present

## 2022-12-01 DIAGNOSIS — E876 Hypokalemia: Secondary | ICD-10-CM | POA: Diagnosis not present

## 2022-12-01 DIAGNOSIS — D631 Anemia in chronic kidney disease: Secondary | ICD-10-CM | POA: Diagnosis not present

## 2022-12-01 DIAGNOSIS — N186 End stage renal disease: Secondary | ICD-10-CM | POA: Diagnosis not present

## 2022-12-01 DIAGNOSIS — Z992 Dependence on renal dialysis: Secondary | ICD-10-CM | POA: Diagnosis not present

## 2022-12-01 DIAGNOSIS — R519 Headache, unspecified: Secondary | ICD-10-CM | POA: Diagnosis not present

## 2022-12-01 DIAGNOSIS — T7840XD Allergy, unspecified, subsequent encounter: Secondary | ICD-10-CM | POA: Diagnosis not present

## 2022-12-04 DIAGNOSIS — D689 Coagulation defect, unspecified: Secondary | ICD-10-CM | POA: Diagnosis not present

## 2022-12-04 DIAGNOSIS — T7840XD Allergy, unspecified, subsequent encounter: Secondary | ICD-10-CM | POA: Diagnosis not present

## 2022-12-04 DIAGNOSIS — E876 Hypokalemia: Secondary | ICD-10-CM | POA: Diagnosis not present

## 2022-12-04 DIAGNOSIS — N186 End stage renal disease: Secondary | ICD-10-CM | POA: Diagnosis not present

## 2022-12-04 DIAGNOSIS — D631 Anemia in chronic kidney disease: Secondary | ICD-10-CM | POA: Diagnosis not present

## 2022-12-04 DIAGNOSIS — R519 Headache, unspecified: Secondary | ICD-10-CM | POA: Diagnosis not present

## 2022-12-04 DIAGNOSIS — Z992 Dependence on renal dialysis: Secondary | ICD-10-CM | POA: Diagnosis not present

## 2022-12-06 DIAGNOSIS — D689 Coagulation defect, unspecified: Secondary | ICD-10-CM | POA: Diagnosis not present

## 2022-12-06 DIAGNOSIS — D631 Anemia in chronic kidney disease: Secondary | ICD-10-CM | POA: Diagnosis not present

## 2022-12-06 DIAGNOSIS — N186 End stage renal disease: Secondary | ICD-10-CM | POA: Diagnosis not present

## 2022-12-06 DIAGNOSIS — Z992 Dependence on renal dialysis: Secondary | ICD-10-CM | POA: Diagnosis not present

## 2022-12-06 DIAGNOSIS — T7840XD Allergy, unspecified, subsequent encounter: Secondary | ICD-10-CM | POA: Diagnosis not present

## 2022-12-06 DIAGNOSIS — R519 Headache, unspecified: Secondary | ICD-10-CM | POA: Diagnosis not present

## 2022-12-06 DIAGNOSIS — E876 Hypokalemia: Secondary | ICD-10-CM | POA: Diagnosis not present

## 2022-12-08 DIAGNOSIS — R519 Headache, unspecified: Secondary | ICD-10-CM | POA: Diagnosis not present

## 2022-12-08 DIAGNOSIS — E876 Hypokalemia: Secondary | ICD-10-CM | POA: Diagnosis not present

## 2022-12-08 DIAGNOSIS — D631 Anemia in chronic kidney disease: Secondary | ICD-10-CM | POA: Diagnosis not present

## 2022-12-08 DIAGNOSIS — D689 Coagulation defect, unspecified: Secondary | ICD-10-CM | POA: Diagnosis not present

## 2022-12-08 DIAGNOSIS — N186 End stage renal disease: Secondary | ICD-10-CM | POA: Diagnosis not present

## 2022-12-08 DIAGNOSIS — Z992 Dependence on renal dialysis: Secondary | ICD-10-CM | POA: Diagnosis not present

## 2022-12-08 DIAGNOSIS — T7840XD Allergy, unspecified, subsequent encounter: Secondary | ICD-10-CM | POA: Diagnosis not present

## 2022-12-11 DIAGNOSIS — Z992 Dependence on renal dialysis: Secondary | ICD-10-CM | POA: Diagnosis not present

## 2022-12-11 DIAGNOSIS — T7840XD Allergy, unspecified, subsequent encounter: Secondary | ICD-10-CM | POA: Diagnosis not present

## 2022-12-11 DIAGNOSIS — E876 Hypokalemia: Secondary | ICD-10-CM | POA: Diagnosis not present

## 2022-12-11 DIAGNOSIS — D631 Anemia in chronic kidney disease: Secondary | ICD-10-CM | POA: Diagnosis not present

## 2022-12-11 DIAGNOSIS — R519 Headache, unspecified: Secondary | ICD-10-CM | POA: Diagnosis not present

## 2022-12-11 DIAGNOSIS — D689 Coagulation defect, unspecified: Secondary | ICD-10-CM | POA: Diagnosis not present

## 2022-12-11 DIAGNOSIS — N186 End stage renal disease: Secondary | ICD-10-CM | POA: Diagnosis not present

## 2022-12-13 DIAGNOSIS — D631 Anemia in chronic kidney disease: Secondary | ICD-10-CM | POA: Diagnosis not present

## 2022-12-13 DIAGNOSIS — T7840XD Allergy, unspecified, subsequent encounter: Secondary | ICD-10-CM | POA: Diagnosis not present

## 2022-12-13 DIAGNOSIS — Z992 Dependence on renal dialysis: Secondary | ICD-10-CM | POA: Diagnosis not present

## 2022-12-13 DIAGNOSIS — N186 End stage renal disease: Secondary | ICD-10-CM | POA: Diagnosis not present

## 2022-12-13 DIAGNOSIS — R519 Headache, unspecified: Secondary | ICD-10-CM | POA: Diagnosis not present

## 2022-12-13 DIAGNOSIS — D689 Coagulation defect, unspecified: Secondary | ICD-10-CM | POA: Diagnosis not present

## 2022-12-13 DIAGNOSIS — E876 Hypokalemia: Secondary | ICD-10-CM | POA: Diagnosis not present

## 2022-12-15 DIAGNOSIS — D689 Coagulation defect, unspecified: Secondary | ICD-10-CM | POA: Diagnosis not present

## 2022-12-15 DIAGNOSIS — T7840XD Allergy, unspecified, subsequent encounter: Secondary | ICD-10-CM | POA: Diagnosis not present

## 2022-12-15 DIAGNOSIS — E876 Hypokalemia: Secondary | ICD-10-CM | POA: Diagnosis not present

## 2022-12-15 DIAGNOSIS — R519 Headache, unspecified: Secondary | ICD-10-CM | POA: Diagnosis not present

## 2022-12-15 DIAGNOSIS — D631 Anemia in chronic kidney disease: Secondary | ICD-10-CM | POA: Diagnosis not present

## 2022-12-15 DIAGNOSIS — Z992 Dependence on renal dialysis: Secondary | ICD-10-CM | POA: Diagnosis not present

## 2022-12-15 DIAGNOSIS — N186 End stage renal disease: Secondary | ICD-10-CM | POA: Diagnosis not present

## 2022-12-18 DIAGNOSIS — N186 End stage renal disease: Secondary | ICD-10-CM | POA: Diagnosis not present

## 2022-12-18 DIAGNOSIS — D631 Anemia in chronic kidney disease: Secondary | ICD-10-CM | POA: Diagnosis not present

## 2022-12-18 DIAGNOSIS — R519 Headache, unspecified: Secondary | ICD-10-CM | POA: Diagnosis not present

## 2022-12-18 DIAGNOSIS — E876 Hypokalemia: Secondary | ICD-10-CM | POA: Diagnosis not present

## 2022-12-18 DIAGNOSIS — Z992 Dependence on renal dialysis: Secondary | ICD-10-CM | POA: Diagnosis not present

## 2022-12-18 DIAGNOSIS — D689 Coagulation defect, unspecified: Secondary | ICD-10-CM | POA: Diagnosis not present

## 2022-12-18 DIAGNOSIS — T7840XD Allergy, unspecified, subsequent encounter: Secondary | ICD-10-CM | POA: Diagnosis not present

## 2022-12-19 DIAGNOSIS — Z1211 Encounter for screening for malignant neoplasm of colon: Secondary | ICD-10-CM | POA: Diagnosis not present

## 2022-12-19 DIAGNOSIS — Z992 Dependence on renal dialysis: Secondary | ICD-10-CM | POA: Diagnosis not present

## 2022-12-19 DIAGNOSIS — N186 End stage renal disease: Secondary | ICD-10-CM | POA: Diagnosis not present

## 2022-12-21 ENCOUNTER — Other Ambulatory Visit (HOSPITAL_COMMUNITY)
Admission: RE | Admit: 2022-12-21 | Discharge: 2022-12-21 | Disposition: A | Discharge status: Home / Self Care | Payer: Medicare Other | Source: Ambulatory Visit | Attending: Family Medicine | Admitting: Family Medicine

## 2022-12-21 DIAGNOSIS — N186 End stage renal disease: Secondary | ICD-10-CM | POA: Diagnosis not present

## 2022-12-21 DIAGNOSIS — T7840XD Allergy, unspecified, subsequent encounter: Secondary | ICD-10-CM | POA: Diagnosis not present

## 2022-12-21 DIAGNOSIS — Z124 Encounter for screening for malignant neoplasm of cervix: Secondary | ICD-10-CM | POA: Diagnosis present

## 2022-12-21 DIAGNOSIS — D689 Coagulation defect, unspecified: Secondary | ICD-10-CM | POA: Diagnosis not present

## 2022-12-21 DIAGNOSIS — Z992 Dependence on renal dialysis: Secondary | ICD-10-CM | POA: Diagnosis not present

## 2022-12-21 DIAGNOSIS — D631 Anemia in chronic kidney disease: Secondary | ICD-10-CM | POA: Diagnosis not present

## 2022-12-21 DIAGNOSIS — R519 Headache, unspecified: Secondary | ICD-10-CM | POA: Diagnosis not present

## 2022-12-21 DIAGNOSIS — E876 Hypokalemia: Secondary | ICD-10-CM | POA: Diagnosis not present

## 2022-12-22 DIAGNOSIS — E876 Hypokalemia: Secondary | ICD-10-CM | POA: Diagnosis not present

## 2022-12-22 DIAGNOSIS — N186 End stage renal disease: Secondary | ICD-10-CM | POA: Diagnosis not present

## 2022-12-22 DIAGNOSIS — T7840XD Allergy, unspecified, subsequent encounter: Secondary | ICD-10-CM | POA: Diagnosis not present

## 2022-12-22 DIAGNOSIS — D631 Anemia in chronic kidney disease: Secondary | ICD-10-CM | POA: Diagnosis not present

## 2022-12-22 DIAGNOSIS — Z992 Dependence on renal dialysis: Secondary | ICD-10-CM | POA: Diagnosis not present

## 2022-12-22 DIAGNOSIS — D689 Coagulation defect, unspecified: Secondary | ICD-10-CM | POA: Diagnosis not present

## 2022-12-22 DIAGNOSIS — R519 Headache, unspecified: Secondary | ICD-10-CM | POA: Diagnosis not present

## 2022-12-25 DIAGNOSIS — Z992 Dependence on renal dialysis: Secondary | ICD-10-CM | POA: Diagnosis not present

## 2022-12-25 DIAGNOSIS — D631 Anemia in chronic kidney disease: Secondary | ICD-10-CM | POA: Diagnosis not present

## 2022-12-25 DIAGNOSIS — T7840XD Allergy, unspecified, subsequent encounter: Secondary | ICD-10-CM | POA: Diagnosis not present

## 2022-12-25 DIAGNOSIS — E876 Hypokalemia: Secondary | ICD-10-CM | POA: Diagnosis not present

## 2022-12-25 DIAGNOSIS — D689 Coagulation defect, unspecified: Secondary | ICD-10-CM | POA: Diagnosis not present

## 2022-12-25 DIAGNOSIS — N186 End stage renal disease: Secondary | ICD-10-CM | POA: Diagnosis not present

## 2022-12-25 DIAGNOSIS — R519 Headache, unspecified: Secondary | ICD-10-CM | POA: Diagnosis not present

## 2022-12-26 LAB — CYTOLOGY - PAP
Adequacy: ABSENT
Diagnosis: NEGATIVE

## 2022-12-27 DIAGNOSIS — D689 Coagulation defect, unspecified: Secondary | ICD-10-CM | POA: Diagnosis not present

## 2022-12-27 DIAGNOSIS — D631 Anemia in chronic kidney disease: Secondary | ICD-10-CM | POA: Diagnosis not present

## 2022-12-27 DIAGNOSIS — N186 End stage renal disease: Secondary | ICD-10-CM | POA: Diagnosis not present

## 2022-12-27 DIAGNOSIS — R519 Headache, unspecified: Secondary | ICD-10-CM | POA: Diagnosis not present

## 2022-12-27 DIAGNOSIS — E876 Hypokalemia: Secondary | ICD-10-CM | POA: Diagnosis not present

## 2022-12-27 DIAGNOSIS — T7840XD Allergy, unspecified, subsequent encounter: Secondary | ICD-10-CM | POA: Diagnosis not present

## 2022-12-27 DIAGNOSIS — E1122 Type 2 diabetes mellitus with diabetic chronic kidney disease: Secondary | ICD-10-CM | POA: Diagnosis not present

## 2022-12-27 DIAGNOSIS — Z992 Dependence on renal dialysis: Secondary | ICD-10-CM | POA: Diagnosis not present

## 2022-12-29 DIAGNOSIS — T7840XD Allergy, unspecified, subsequent encounter: Secondary | ICD-10-CM | POA: Diagnosis not present

## 2022-12-29 DIAGNOSIS — D689 Coagulation defect, unspecified: Secondary | ICD-10-CM | POA: Diagnosis not present

## 2022-12-29 DIAGNOSIS — Z992 Dependence on renal dialysis: Secondary | ICD-10-CM | POA: Diagnosis not present

## 2022-12-29 DIAGNOSIS — D631 Anemia in chronic kidney disease: Secondary | ICD-10-CM | POA: Diagnosis not present

## 2022-12-29 DIAGNOSIS — E876 Hypokalemia: Secondary | ICD-10-CM | POA: Diagnosis not present

## 2022-12-29 DIAGNOSIS — N186 End stage renal disease: Secondary | ICD-10-CM | POA: Diagnosis not present

## 2023-01-01 DIAGNOSIS — N186 End stage renal disease: Secondary | ICD-10-CM | POA: Diagnosis not present

## 2023-01-01 DIAGNOSIS — E876 Hypokalemia: Secondary | ICD-10-CM | POA: Diagnosis not present

## 2023-01-01 DIAGNOSIS — D689 Coagulation defect, unspecified: Secondary | ICD-10-CM | POA: Diagnosis not present

## 2023-01-01 DIAGNOSIS — T7840XD Allergy, unspecified, subsequent encounter: Secondary | ICD-10-CM | POA: Diagnosis not present

## 2023-01-01 DIAGNOSIS — Z992 Dependence on renal dialysis: Secondary | ICD-10-CM | POA: Diagnosis not present

## 2023-01-01 DIAGNOSIS — D631 Anemia in chronic kidney disease: Secondary | ICD-10-CM | POA: Diagnosis not present

## 2023-01-03 DIAGNOSIS — Z992 Dependence on renal dialysis: Secondary | ICD-10-CM | POA: Diagnosis not present

## 2023-01-03 DIAGNOSIS — E876 Hypokalemia: Secondary | ICD-10-CM | POA: Diagnosis not present

## 2023-01-03 DIAGNOSIS — N186 End stage renal disease: Secondary | ICD-10-CM | POA: Diagnosis not present

## 2023-01-03 DIAGNOSIS — D631 Anemia in chronic kidney disease: Secondary | ICD-10-CM | POA: Diagnosis not present

## 2023-01-03 DIAGNOSIS — D689 Coagulation defect, unspecified: Secondary | ICD-10-CM | POA: Diagnosis not present

## 2023-01-03 DIAGNOSIS — T7840XD Allergy, unspecified, subsequent encounter: Secondary | ICD-10-CM | POA: Diagnosis not present

## 2023-01-05 DIAGNOSIS — D631 Anemia in chronic kidney disease: Secondary | ICD-10-CM | POA: Diagnosis not present

## 2023-01-05 DIAGNOSIS — E876 Hypokalemia: Secondary | ICD-10-CM | POA: Diagnosis not present

## 2023-01-05 DIAGNOSIS — T7840XD Allergy, unspecified, subsequent encounter: Secondary | ICD-10-CM | POA: Diagnosis not present

## 2023-01-05 DIAGNOSIS — Z992 Dependence on renal dialysis: Secondary | ICD-10-CM | POA: Diagnosis not present

## 2023-01-05 DIAGNOSIS — N186 End stage renal disease: Secondary | ICD-10-CM | POA: Diagnosis not present

## 2023-01-05 DIAGNOSIS — D689 Coagulation defect, unspecified: Secondary | ICD-10-CM | POA: Diagnosis not present

## 2023-01-06 DIAGNOSIS — E119 Type 2 diabetes mellitus without complications: Secondary | ICD-10-CM | POA: Diagnosis not present

## 2023-01-06 DIAGNOSIS — Z1211 Encounter for screening for malignant neoplasm of colon: Secondary | ICD-10-CM | POA: Diagnosis not present

## 2023-01-08 DIAGNOSIS — N186 End stage renal disease: Secondary | ICD-10-CM | POA: Diagnosis not present

## 2023-01-08 DIAGNOSIS — D689 Coagulation defect, unspecified: Secondary | ICD-10-CM | POA: Diagnosis not present

## 2023-01-08 DIAGNOSIS — E876 Hypokalemia: Secondary | ICD-10-CM | POA: Diagnosis not present

## 2023-01-08 DIAGNOSIS — T7840XD Allergy, unspecified, subsequent encounter: Secondary | ICD-10-CM | POA: Diagnosis not present

## 2023-01-08 DIAGNOSIS — Z992 Dependence on renal dialysis: Secondary | ICD-10-CM | POA: Diagnosis not present

## 2023-01-08 DIAGNOSIS — D631 Anemia in chronic kidney disease: Secondary | ICD-10-CM | POA: Diagnosis not present

## 2023-01-10 DIAGNOSIS — D689 Coagulation defect, unspecified: Secondary | ICD-10-CM | POA: Diagnosis not present

## 2023-01-10 DIAGNOSIS — T7840XD Allergy, unspecified, subsequent encounter: Secondary | ICD-10-CM | POA: Diagnosis not present

## 2023-01-10 DIAGNOSIS — N186 End stage renal disease: Secondary | ICD-10-CM | POA: Diagnosis not present

## 2023-01-10 DIAGNOSIS — E876 Hypokalemia: Secondary | ICD-10-CM | POA: Diagnosis not present

## 2023-01-10 DIAGNOSIS — D631 Anemia in chronic kidney disease: Secondary | ICD-10-CM | POA: Diagnosis not present

## 2023-01-10 DIAGNOSIS — Z992 Dependence on renal dialysis: Secondary | ICD-10-CM | POA: Diagnosis not present

## 2023-01-12 DIAGNOSIS — T7840XD Allergy, unspecified, subsequent encounter: Secondary | ICD-10-CM | POA: Diagnosis not present

## 2023-01-12 DIAGNOSIS — E876 Hypokalemia: Secondary | ICD-10-CM | POA: Diagnosis not present

## 2023-01-12 DIAGNOSIS — D631 Anemia in chronic kidney disease: Secondary | ICD-10-CM | POA: Diagnosis not present

## 2023-01-12 DIAGNOSIS — N186 End stage renal disease: Secondary | ICD-10-CM | POA: Diagnosis not present

## 2023-01-12 DIAGNOSIS — D689 Coagulation defect, unspecified: Secondary | ICD-10-CM | POA: Diagnosis not present

## 2023-01-12 DIAGNOSIS — Z992 Dependence on renal dialysis: Secondary | ICD-10-CM | POA: Diagnosis not present

## 2023-01-15 DIAGNOSIS — Z992 Dependence on renal dialysis: Secondary | ICD-10-CM | POA: Diagnosis not present

## 2023-01-15 DIAGNOSIS — N186 End stage renal disease: Secondary | ICD-10-CM | POA: Diagnosis not present

## 2023-01-15 DIAGNOSIS — D689 Coagulation defect, unspecified: Secondary | ICD-10-CM | POA: Diagnosis not present

## 2023-01-15 DIAGNOSIS — T7840XD Allergy, unspecified, subsequent encounter: Secondary | ICD-10-CM | POA: Diagnosis not present

## 2023-01-15 DIAGNOSIS — E876 Hypokalemia: Secondary | ICD-10-CM | POA: Diagnosis not present

## 2023-01-15 DIAGNOSIS — D631 Anemia in chronic kidney disease: Secondary | ICD-10-CM | POA: Diagnosis not present

## 2023-01-17 DIAGNOSIS — T7840XD Allergy, unspecified, subsequent encounter: Secondary | ICD-10-CM | POA: Diagnosis not present

## 2023-01-17 DIAGNOSIS — D631 Anemia in chronic kidney disease: Secondary | ICD-10-CM | POA: Diagnosis not present

## 2023-01-17 DIAGNOSIS — Z992 Dependence on renal dialysis: Secondary | ICD-10-CM | POA: Diagnosis not present

## 2023-01-17 DIAGNOSIS — E876 Hypokalemia: Secondary | ICD-10-CM | POA: Diagnosis not present

## 2023-01-17 DIAGNOSIS — N186 End stage renal disease: Secondary | ICD-10-CM | POA: Diagnosis not present

## 2023-01-17 DIAGNOSIS — D689 Coagulation defect, unspecified: Secondary | ICD-10-CM | POA: Diagnosis not present

## 2023-01-18 DIAGNOSIS — Z992 Dependence on renal dialysis: Secondary | ICD-10-CM | POA: Diagnosis not present

## 2023-01-18 DIAGNOSIS — E876 Hypokalemia: Secondary | ICD-10-CM | POA: Diagnosis not present

## 2023-01-18 DIAGNOSIS — D689 Coagulation defect, unspecified: Secondary | ICD-10-CM | POA: Diagnosis not present

## 2023-01-18 DIAGNOSIS — N186 End stage renal disease: Secondary | ICD-10-CM | POA: Diagnosis not present

## 2023-01-18 DIAGNOSIS — T7840XD Allergy, unspecified, subsequent encounter: Secondary | ICD-10-CM | POA: Diagnosis not present

## 2023-01-18 DIAGNOSIS — D631 Anemia in chronic kidney disease: Secondary | ICD-10-CM | POA: Diagnosis not present

## 2023-01-22 DIAGNOSIS — E876 Hypokalemia: Secondary | ICD-10-CM | POA: Diagnosis not present

## 2023-01-22 DIAGNOSIS — D689 Coagulation defect, unspecified: Secondary | ICD-10-CM | POA: Diagnosis not present

## 2023-01-22 DIAGNOSIS — T7840XD Allergy, unspecified, subsequent encounter: Secondary | ICD-10-CM | POA: Diagnosis not present

## 2023-01-22 DIAGNOSIS — N186 End stage renal disease: Secondary | ICD-10-CM | POA: Diagnosis not present

## 2023-01-22 DIAGNOSIS — Z992 Dependence on renal dialysis: Secondary | ICD-10-CM | POA: Diagnosis not present

## 2023-01-22 DIAGNOSIS — D631 Anemia in chronic kidney disease: Secondary | ICD-10-CM | POA: Diagnosis not present

## 2023-01-24 DIAGNOSIS — Z992 Dependence on renal dialysis: Secondary | ICD-10-CM | POA: Diagnosis not present

## 2023-01-24 DIAGNOSIS — D631 Anemia in chronic kidney disease: Secondary | ICD-10-CM | POA: Diagnosis not present

## 2023-01-24 DIAGNOSIS — D689 Coagulation defect, unspecified: Secondary | ICD-10-CM | POA: Diagnosis not present

## 2023-01-24 DIAGNOSIS — N186 End stage renal disease: Secondary | ICD-10-CM | POA: Diagnosis not present

## 2023-01-24 DIAGNOSIS — T7840XD Allergy, unspecified, subsequent encounter: Secondary | ICD-10-CM | POA: Diagnosis not present

## 2023-01-24 DIAGNOSIS — E876 Hypokalemia: Secondary | ICD-10-CM | POA: Diagnosis not present

## 2023-01-25 DIAGNOSIS — N186 End stage renal disease: Secondary | ICD-10-CM | POA: Diagnosis not present

## 2023-01-25 DIAGNOSIS — Z992 Dependence on renal dialysis: Secondary | ICD-10-CM | POA: Diagnosis not present

## 2023-01-25 DIAGNOSIS — E1122 Type 2 diabetes mellitus with diabetic chronic kidney disease: Secondary | ICD-10-CM | POA: Diagnosis not present

## 2023-01-26 DIAGNOSIS — D689 Coagulation defect, unspecified: Secondary | ICD-10-CM | POA: Diagnosis not present

## 2023-01-26 DIAGNOSIS — N186 End stage renal disease: Secondary | ICD-10-CM | POA: Diagnosis not present

## 2023-01-26 DIAGNOSIS — Z992 Dependence on renal dialysis: Secondary | ICD-10-CM | POA: Diagnosis not present

## 2023-01-26 DIAGNOSIS — E876 Hypokalemia: Secondary | ICD-10-CM | POA: Diagnosis not present

## 2023-01-26 DIAGNOSIS — D631 Anemia in chronic kidney disease: Secondary | ICD-10-CM | POA: Diagnosis not present

## 2023-01-26 DIAGNOSIS — T7840XD Allergy, unspecified, subsequent encounter: Secondary | ICD-10-CM | POA: Diagnosis not present

## 2023-01-29 DIAGNOSIS — Z992 Dependence on renal dialysis: Secondary | ICD-10-CM | POA: Diagnosis not present

## 2023-01-29 DIAGNOSIS — D631 Anemia in chronic kidney disease: Secondary | ICD-10-CM | POA: Diagnosis not present

## 2023-01-29 DIAGNOSIS — E876 Hypokalemia: Secondary | ICD-10-CM | POA: Diagnosis not present

## 2023-01-29 DIAGNOSIS — D689 Coagulation defect, unspecified: Secondary | ICD-10-CM | POA: Diagnosis not present

## 2023-01-29 DIAGNOSIS — T7840XD Allergy, unspecified, subsequent encounter: Secondary | ICD-10-CM | POA: Diagnosis not present

## 2023-01-29 DIAGNOSIS — N186 End stage renal disease: Secondary | ICD-10-CM | POA: Diagnosis not present

## 2023-01-31 DIAGNOSIS — D631 Anemia in chronic kidney disease: Secondary | ICD-10-CM | POA: Diagnosis not present

## 2023-01-31 DIAGNOSIS — N186 End stage renal disease: Secondary | ICD-10-CM | POA: Diagnosis not present

## 2023-01-31 DIAGNOSIS — Z992 Dependence on renal dialysis: Secondary | ICD-10-CM | POA: Diagnosis not present

## 2023-01-31 DIAGNOSIS — T7840XD Allergy, unspecified, subsequent encounter: Secondary | ICD-10-CM | POA: Diagnosis not present

## 2023-01-31 DIAGNOSIS — E876 Hypokalemia: Secondary | ICD-10-CM | POA: Diagnosis not present

## 2023-01-31 DIAGNOSIS — D689 Coagulation defect, unspecified: Secondary | ICD-10-CM | POA: Diagnosis not present

## 2023-02-02 DIAGNOSIS — Z992 Dependence on renal dialysis: Secondary | ICD-10-CM | POA: Diagnosis not present

## 2023-02-02 DIAGNOSIS — D689 Coagulation defect, unspecified: Secondary | ICD-10-CM | POA: Diagnosis not present

## 2023-02-02 DIAGNOSIS — E876 Hypokalemia: Secondary | ICD-10-CM | POA: Diagnosis not present

## 2023-02-02 DIAGNOSIS — N186 End stage renal disease: Secondary | ICD-10-CM | POA: Diagnosis not present

## 2023-02-02 DIAGNOSIS — T7840XD Allergy, unspecified, subsequent encounter: Secondary | ICD-10-CM | POA: Diagnosis not present

## 2023-02-02 DIAGNOSIS — D631 Anemia in chronic kidney disease: Secondary | ICD-10-CM | POA: Diagnosis not present

## 2023-02-05 DIAGNOSIS — E876 Hypokalemia: Secondary | ICD-10-CM | POA: Diagnosis not present

## 2023-02-05 DIAGNOSIS — Z992 Dependence on renal dialysis: Secondary | ICD-10-CM | POA: Diagnosis not present

## 2023-02-05 DIAGNOSIS — D631 Anemia in chronic kidney disease: Secondary | ICD-10-CM | POA: Diagnosis not present

## 2023-02-05 DIAGNOSIS — T7840XD Allergy, unspecified, subsequent encounter: Secondary | ICD-10-CM | POA: Diagnosis not present

## 2023-02-05 DIAGNOSIS — D689 Coagulation defect, unspecified: Secondary | ICD-10-CM | POA: Diagnosis not present

## 2023-02-05 DIAGNOSIS — N186 End stage renal disease: Secondary | ICD-10-CM | POA: Diagnosis not present

## 2023-02-07 DIAGNOSIS — Z992 Dependence on renal dialysis: Secondary | ICD-10-CM | POA: Diagnosis not present

## 2023-02-07 DIAGNOSIS — D689 Coagulation defect, unspecified: Secondary | ICD-10-CM | POA: Diagnosis not present

## 2023-02-07 DIAGNOSIS — E876 Hypokalemia: Secondary | ICD-10-CM | POA: Diagnosis not present

## 2023-02-07 DIAGNOSIS — N186 End stage renal disease: Secondary | ICD-10-CM | POA: Diagnosis not present

## 2023-02-07 DIAGNOSIS — D631 Anemia in chronic kidney disease: Secondary | ICD-10-CM | POA: Diagnosis not present

## 2023-02-07 DIAGNOSIS — T7840XD Allergy, unspecified, subsequent encounter: Secondary | ICD-10-CM | POA: Diagnosis not present

## 2023-02-09 DIAGNOSIS — D689 Coagulation defect, unspecified: Secondary | ICD-10-CM | POA: Diagnosis not present

## 2023-02-09 DIAGNOSIS — E876 Hypokalemia: Secondary | ICD-10-CM | POA: Diagnosis not present

## 2023-02-09 DIAGNOSIS — T7840XD Allergy, unspecified, subsequent encounter: Secondary | ICD-10-CM | POA: Diagnosis not present

## 2023-02-09 DIAGNOSIS — N186 End stage renal disease: Secondary | ICD-10-CM | POA: Diagnosis not present

## 2023-02-09 DIAGNOSIS — Z992 Dependence on renal dialysis: Secondary | ICD-10-CM | POA: Diagnosis not present

## 2023-02-09 DIAGNOSIS — D631 Anemia in chronic kidney disease: Secondary | ICD-10-CM | POA: Diagnosis not present

## 2023-02-12 DIAGNOSIS — Z992 Dependence on renal dialysis: Secondary | ICD-10-CM | POA: Diagnosis not present

## 2023-02-12 DIAGNOSIS — E876 Hypokalemia: Secondary | ICD-10-CM | POA: Diagnosis not present

## 2023-02-12 DIAGNOSIS — D689 Coagulation defect, unspecified: Secondary | ICD-10-CM | POA: Diagnosis not present

## 2023-02-12 DIAGNOSIS — D631 Anemia in chronic kidney disease: Secondary | ICD-10-CM | POA: Diagnosis not present

## 2023-02-12 DIAGNOSIS — N186 End stage renal disease: Secondary | ICD-10-CM | POA: Diagnosis not present

## 2023-02-12 DIAGNOSIS — T7840XD Allergy, unspecified, subsequent encounter: Secondary | ICD-10-CM | POA: Diagnosis not present

## 2023-02-16 DIAGNOSIS — Z992 Dependence on renal dialysis: Secondary | ICD-10-CM | POA: Diagnosis not present

## 2023-02-16 DIAGNOSIS — D631 Anemia in chronic kidney disease: Secondary | ICD-10-CM | POA: Diagnosis not present

## 2023-02-16 DIAGNOSIS — T7840XD Allergy, unspecified, subsequent encounter: Secondary | ICD-10-CM | POA: Diagnosis not present

## 2023-02-16 DIAGNOSIS — E876 Hypokalemia: Secondary | ICD-10-CM | POA: Diagnosis not present

## 2023-02-16 DIAGNOSIS — D689 Coagulation defect, unspecified: Secondary | ICD-10-CM | POA: Diagnosis not present

## 2023-02-16 DIAGNOSIS — N186 End stage renal disease: Secondary | ICD-10-CM | POA: Diagnosis not present

## 2023-02-17 DIAGNOSIS — D689 Coagulation defect, unspecified: Secondary | ICD-10-CM | POA: Diagnosis not present

## 2023-02-17 DIAGNOSIS — E876 Hypokalemia: Secondary | ICD-10-CM | POA: Diagnosis not present

## 2023-02-17 DIAGNOSIS — Z992 Dependence on renal dialysis: Secondary | ICD-10-CM | POA: Diagnosis not present

## 2023-02-17 DIAGNOSIS — N186 End stage renal disease: Secondary | ICD-10-CM | POA: Diagnosis not present

## 2023-02-17 DIAGNOSIS — T7840XD Allergy, unspecified, subsequent encounter: Secondary | ICD-10-CM | POA: Diagnosis not present

## 2023-02-17 DIAGNOSIS — D631 Anemia in chronic kidney disease: Secondary | ICD-10-CM | POA: Diagnosis not present

## 2023-02-21 DIAGNOSIS — Z992 Dependence on renal dialysis: Secondary | ICD-10-CM | POA: Diagnosis not present

## 2023-02-21 DIAGNOSIS — T7840XD Allergy, unspecified, subsequent encounter: Secondary | ICD-10-CM | POA: Diagnosis not present

## 2023-02-21 DIAGNOSIS — D631 Anemia in chronic kidney disease: Secondary | ICD-10-CM | POA: Diagnosis not present

## 2023-02-21 DIAGNOSIS — N186 End stage renal disease: Secondary | ICD-10-CM | POA: Diagnosis not present

## 2023-02-21 DIAGNOSIS — E876 Hypokalemia: Secondary | ICD-10-CM | POA: Diagnosis not present

## 2023-02-21 DIAGNOSIS — D689 Coagulation defect, unspecified: Secondary | ICD-10-CM | POA: Diagnosis not present

## 2023-02-23 ENCOUNTER — Encounter (HOSPITAL_COMMUNITY): Payer: Self-pay | Admitting: Emergency Medicine

## 2023-02-23 ENCOUNTER — Other Ambulatory Visit: Payer: Self-pay

## 2023-02-23 ENCOUNTER — Emergency Department (HOSPITAL_COMMUNITY): Payer: Medicare Other

## 2023-02-23 ENCOUNTER — Emergency Department (HOSPITAL_COMMUNITY)
Admission: EM | Admit: 2023-02-23 | Discharge: 2023-02-23 | Disposition: A | Discharge status: Home / Self Care | Payer: Medicare Other | Attending: Emergency Medicine | Admitting: Emergency Medicine

## 2023-02-23 DIAGNOSIS — Z992 Dependence on renal dialysis: Secondary | ICD-10-CM | POA: Diagnosis not present

## 2023-02-23 DIAGNOSIS — E876 Hypokalemia: Secondary | ICD-10-CM | POA: Diagnosis not present

## 2023-02-23 DIAGNOSIS — N184 Chronic kidney disease, stage 4 (severe): Secondary | ICD-10-CM | POA: Diagnosis not present

## 2023-02-23 DIAGNOSIS — R200 Anesthesia of skin: Secondary | ICD-10-CM | POA: Diagnosis not present

## 2023-02-23 DIAGNOSIS — E1122 Type 2 diabetes mellitus with diabetic chronic kidney disease: Secondary | ICD-10-CM | POA: Diagnosis not present

## 2023-02-23 DIAGNOSIS — D631 Anemia in chronic kidney disease: Secondary | ICD-10-CM | POA: Diagnosis not present

## 2023-02-23 DIAGNOSIS — Z743 Need for continuous supervision: Secondary | ICD-10-CM | POA: Diagnosis not present

## 2023-02-23 DIAGNOSIS — R202 Paresthesia of skin: Secondary | ICD-10-CM | POA: Insufficient documentation

## 2023-02-23 DIAGNOSIS — R0602 Shortness of breath: Secondary | ICD-10-CM | POA: Insufficient documentation

## 2023-02-23 DIAGNOSIS — N186 End stage renal disease: Secondary | ICD-10-CM | POA: Diagnosis not present

## 2023-02-23 DIAGNOSIS — I129 Hypertensive chronic kidney disease with stage 1 through stage 4 chronic kidney disease, or unspecified chronic kidney disease: Secondary | ICD-10-CM | POA: Insufficient documentation

## 2023-02-23 DIAGNOSIS — D689 Coagulation defect, unspecified: Secondary | ICD-10-CM | POA: Diagnosis not present

## 2023-02-23 DIAGNOSIS — R110 Nausea: Secondary | ICD-10-CM | POA: Diagnosis not present

## 2023-02-23 DIAGNOSIS — Z0389 Encounter for observation for other suspected diseases and conditions ruled out: Secondary | ICD-10-CM | POA: Diagnosis not present

## 2023-02-23 DIAGNOSIS — E1165 Type 2 diabetes mellitus with hyperglycemia: Secondary | ICD-10-CM | POA: Diagnosis not present

## 2023-02-23 DIAGNOSIS — R109 Unspecified abdominal pain: Secondary | ICD-10-CM | POA: Diagnosis not present

## 2023-02-23 DIAGNOSIS — I10 Essential (primary) hypertension: Secondary | ICD-10-CM | POA: Diagnosis not present

## 2023-02-23 DIAGNOSIS — T7840XD Allergy, unspecified, subsequent encounter: Secondary | ICD-10-CM | POA: Diagnosis not present

## 2023-02-23 DIAGNOSIS — R6889 Other general symptoms and signs: Secondary | ICD-10-CM | POA: Diagnosis not present

## 2023-02-23 LAB — COMPREHENSIVE METABOLIC PANEL
ALT: 11 U/L (ref 0–44)
AST: 21 U/L (ref 15–41)
Albumin: 3.4 g/dL — ABNORMAL LOW (ref 3.5–5.0)
Alkaline Phosphatase: 57 U/L (ref 38–126)
Anion gap: 12 (ref 5–15)
BUN: 25 mg/dL — ABNORMAL HIGH (ref 8–23)
CO2: 24 mmol/L (ref 22–32)
Calcium: 9.1 mg/dL (ref 8.9–10.3)
Chloride: 101 mmol/L (ref 98–111)
Creatinine, Ser: 5.78 mg/dL — ABNORMAL HIGH (ref 0.44–1.00)
GFR, Estimated: 7 mL/min — ABNORMAL LOW (ref >60)
Glucose, Bld: 87 mg/dL (ref 70–99)
Potassium: 4.2 mmol/L (ref 3.5–5.1)
Sodium: 137 mmol/L (ref 135–145)
Total Bilirubin: 0.7 mg/dL (ref 0.3–1.2)
Total Protein: 7.1 g/dL (ref 6.5–8.1)

## 2023-02-23 LAB — CBC WITH DIFFERENTIAL/PLATELET
Abs Immature Granulocytes: 0.02 10*3/uL (ref 0.00–0.07)
Basophils Absolute: 0 10*3/uL (ref 0.0–0.1)
Basophils Relative: 1 %
Eosinophils Absolute: 0.1 10*3/uL (ref 0.0–0.5)
Eosinophils Relative: 3 %
HCT: 34.1 % — ABNORMAL LOW (ref 36.0–46.0)
Hemoglobin: 11.1 g/dL — ABNORMAL LOW (ref 12.0–15.0)
Immature Granulocytes: 0 %
Lymphocytes Relative: 26 %
Lymphs Abs: 1.3 10*3/uL (ref 0.7–4.0)
MCH: 29.8 pg (ref 26.0–34.0)
MCHC: 32.6 g/dL (ref 30.0–36.0)
MCV: 91.4 fL (ref 80.0–100.0)
Monocytes Absolute: 0.3 10*3/uL (ref 0.1–1.0)
Monocytes Relative: 6 %
Neutro Abs: 3.3 10*3/uL (ref 1.7–7.7)
Neutrophils Relative %: 64 %
Platelets: 186 10*3/uL (ref 150–400)
RBC: 3.73 MIL/uL — ABNORMAL LOW (ref 3.87–5.11)
RDW: 13.5 % (ref 11.5–15.5)
WBC: 5.1 10*3/uL (ref 4.0–10.5)
nRBC: 0 % (ref 0.0–0.2)

## 2023-02-23 LAB — BRAIN NATRIURETIC PEPTIDE: B Natriuretic Peptide: 50.9 pg/mL (ref 0.0–100.0)

## 2023-02-23 LAB — CBG MONITORING, ED: Glucose-Capillary: 76 mg/dL (ref 70–99)

## 2023-02-23 LAB — TROPONIN I (HIGH SENSITIVITY)
Troponin I (High Sensitivity): 12 ng/L (ref <18)
Troponin I (High Sensitivity): 13 ng/L (ref <18)

## 2023-02-23 MED ORDER — LORAZEPAM 2 MG/ML PO CONC
0.5000 mg | Freq: Once | ORAL | Status: DC | PRN
  Filled 2023-02-23: qty 0.25

## 2023-02-23 MED ORDER — LORAZEPAM 2 MG/ML IJ SOLN
0.5000 mg | Freq: Once | INTRAMUSCULAR | Status: AC | PRN
  Administered 2023-02-23: 0.5 mg via INTRAVENOUS
  Filled 2023-02-23: qty 1

## 2023-02-23 NOTE — ED Notes (Signed)
Pt remains in MRI, will obtain VS when pt returns from MRI.

## 2023-02-23 NOTE — Discharge Instructions (Addendum)
We evaluated you for your numbness.  We obtained testing including an MRI.  We did not see any signs of a stroke on your MRI.  Testing was overall reassuring including negative cardiac enzymes.  Please call your primary care doctor for follow-up.  If you would like to follow-up with neurology, you can also call them to schedule follow-up.  Please return to the emergency department if you develop any one-sided weakness, facial droop, trouble swallowing, trouble speaking, severe headaches, vomiting, vision changes, trouble walking, shortness of breath, or any other new symptoms.

## 2023-02-23 NOTE — ED Notes (Signed)
Patient transported to CT 

## 2023-02-23 NOTE — ED Notes (Signed)
Pt transported to xray 

## 2023-02-23 NOTE — ED Notes (Signed)
Patient transported to MRI 

## 2023-02-23 NOTE — ED Provider Notes (Signed)
[facility] Provider Note  CSN: [id number] Arrival date & time: 02/23/23 [id number]  Chief Complaint(s) Numbness  HPI Jackie Wade is a 77 y.o. female without significant past medical history presenting to the emergency department with numbness.   Past Medical History Past Medical History:  Diagnosis Date   Anemia    Chronic kidney disease (CKD), stage IV (severe) (HCC)    Diabetes mellitus without complication ([place])    Hypertension    Renal disorder    Patient Active Problem List   Diagnosis Date Noted   Acute blood loss anemia 12/23/2021   New onset of congestive heart failure ([place]) 12/19/2021   CKD (chronic kidney disease) stage 5, GFR less than 15 ml/min ([place]) 07/01/2021   Symptomatic anemia 07/01/2021   Type 2 diabetes mellitus with hyperlipidemia ([place]) 06/28/2021   Screening for malignant neoplasm of colon 06/28/2021   Mixed hyperlipidemia 06/28/2021   Vitamin D deficiency 06/28/2021   Asymmetrical sensorineural hearing loss 06/17/2021   Steroid-induced hyperglycemia 06/10/2021   Anemia due to chronic kidney disease 06/10/2021   FSGS (focal segmental glomerulosclerosis) 06/10/2021   Hypokalemia 06/10/2021   Renal disorder 06/09/2021   Hyperosmolar hyperglycemic state (HHS) ([place]) 06/09/2021   Severe hyperglycemia due to diabetes mellitus ([place])    Hyponatremia    AKI (acute kidney injury) ([place])    Allergic rhinitis 05/17/2021   Bilateral impacted cerumen 05/17/2021   Essential hypertension 11/12/2020   Home Medication(s) Prior to Admission medications   Medication Sig Start Date End Date Taking? Authorizing Provider  acetaminophen (TYLENOL) 325 MG tablet Take 2 tablets (650 mg total) by mouth every 4 (four) hours as needed for headache or mild pain. 12/25/21   Arrien, Jimmy Picket, MD  carvedilol (COREG) 6.25 MG tablet Take 1 tablet (6.25 mg total) by mouth 2 (two) times daily with a meal. 12/25/21 01/24/22  Arrien,  Jimmy Picket, MD  furosemide (LASIX) 40 MG tablet Please take on non dialysis days. 12/25/21   Arrien, Jimmy Picket, MD  JANUVIA 100 MG tablet Take 100 mg by mouth daily. 07/08/20   [provider]  rosuvastatin (CRESTOR) 5 MG tablet Take 1 tablet (5 mg total) by mouth daily. Patient taking differently: Take 15 mg by mouth daily. 06/12/21   Nicole Kindred A, DO  sevelamer carbonate (RENVELA) 800 MG tablet Take 800 mg by mouth 3 (three) times daily. 07/26/21   [provider]  insulin glargine (LANTUS) 100 UNIT/ML Solostar Pen Inject 3 Units into the skin daily. 07/04/21 07/04/21  Jennye Boroughs, MD                                                                                                                                    Past Surgical History Past Surgical History:  Procedure Laterality Date   BASCILIC VEIN TRANSPOSITION Left 07/04/2021   Procedure: LEFT ARM BASCILIC VEIN TRANSPOSITION 1ST STAGE;  Surgeon: Ruta Hinds  E, MD;  Location: [facility];  Service: Vascular;  Laterality: Left;   [place] Left 12/22/2021   Procedure: LEFT ARM SECOND STAGE BASILIC VEIN TRANSPOSITION;  Surgeon: Cherre Robins, MD;  Location: [facility];  Service: Vascular;  Laterality: Left;   I & D EXTREMITY Left 12/22/2021   Procedure: IRRIGATION AND DEBRIDEMENT HEMATOMA;  Surgeon: Cherre Robins, MD;  [facility];  Service: Vascular;  Laterality: Left;   INSERTION OF DIALYSIS CATHETER Right 12/22/2021   Procedure: INSERTION OF 14.5 FR/CH X 19 CM  DIALYSIS CATHETER;  Surgeon: Cherre Robins, MD;  [facility];  Service: Vascular;  Laterality: Right;   WRIST SURGERY     Family History Family History  Problem Relation Age of Onset   Premature CHD Neg Hx     Social History Social History   Tobacco Use   Smoking status: Never   Smokeless tobacco: Never  Vaping Use   Vaping Use: Never used  Substance Use Topics   Alcohol use: No   Drug use: No   Allergies Patient  has no known allergies.  Review of Systems Review of Systems  Physical Exam Vital Signs  I have reviewed the triage vital signs BP (!) 162/77 (BP Location: Left Arm)   Pulse 67   Temp 98.9 F (37.2 C) (Oral)   Resp 18   Ht 5\' 3"  (1.6 m)   Wt 59 kg   SpO2 100%   BMI 23.04 kg/m  Physical Exam  ED Results and Treatments Labs (all labs ordered are listed, but only abnormal results are displayed) Labs Reviewed  CBC WITH DIFFERENTIAL/PLATELET - Abnormal; Notable for the following components:      Result Value   RBC 3.73 (*)    Hemoglobin 11.1 (*)    HCT 34.1 (*)    All other components within normal limits  COMPREHENSIVE METABOLIC PANEL - Abnormal; Notable for the following components:   BUN 25 (*)    Creatinine, Ser 5.78 (*)    Albumin 3.4 (*)    GFR, Estimated 7 (*)    All other components within normal limits  BRAIN NATRIURETIC PEPTIDE  CBG MONITORING, ED  TROPONIN I (HIGH SENSITIVITY)  TROPONIN I (HIGH SENSITIVITY)                                                                                                                          Radiology MR BRAIN WO CONTRAST  Result Date: 02/23/2023 CLINICAL DATA:  Stroke suspected EXAM: MRI HEAD WITHOUT CONTRAST TECHNIQUE: Multiplanar, multiecho pulse sequences of the brain and surrounding structures were obtained without intravenous contrast. COMPARISON:  CT head 02/23/23 FINDINGS: Brain: No acute infarction, hemorrhage, hydrocephalus, extra-axial collection or mass lesion. Sequela of mild chronic microvascular ischemic change. Vascular: Normal flow voids. Skull and upper cervical spine: Normal marrow signal. Sinuses/Orbits: Bilateral lens replacement. Trace bilateral mastoid effusions. Mucosal thickening left maxillary sinus and bilateral ethmoid sinuses. Other: None. IMPRESSION: No acute intracranial abnormality.  Electronically Signed   By: Marin Roberts M.D.   On: 02/23/2023 15:52   CT Head Wo Contrast  Result Date:  02/23/2023 CLINICAL DATA:  Neuro deficit, acute, stroke suspected. Right-sided numbness x3 weeks. EXAM: CT HEAD WITHOUT CONTRAST TECHNIQUE: Contiguous axial images were obtained from the base of the skull through the vertex without intravenous contrast. RADIATION DOSE REDUCTION: This exam was performed according to the departmental dose-optimization program which includes automated exposure control, adjustment of the mA and/or kV according to patient size and/or use of iterative reconstruction technique. COMPARISON:  None Available. FINDINGS: Brain: No acute hemorrhage, mass effect or midline shift. Gray-white differentiation is preserved. No hydrocephalus. No extra-axial collection. Basilar cisterns are patent. Vascular: No hyperdense vessel or unexpected calcification. Skull: No calvarial fracture or suspicious bone lesion. Skull base is unremarkable. Sinuses/Orbits: Unremarkable. Other: None. IMPRESSION: No acute intracranial abnormality. Electronically Signed   By: Emmit Alexanders M.D.   On: 02/23/2023 09:37   DG Chest 1 View  Result Date: 02/23/2023 CLINICAL DATA:  Shortness of breath. EXAM: CHEST  1 VIEW COMPARISON:  12/22/2021 FINDINGS: The lungs are clear without focal pneumonia, edema, pneumothorax or pleural effusion. The cardiopericardial silhouette is within normal limits for size. Right IJ dialysis catheter tip overlies the SVC/RA junction. No acute bony abnormality. IMPRESSION: No active disease. Electronically Signed   By: Misty Stanley M.D.   On: 02/23/2023 09:11    Pertinent labs & imaging results that were available during my care of the patient were reviewed by me and considered in my medical decision making (see MDM for details).  Medications Ordered in ED Medications  LORazepam (ATIVAN) injection 0.5 mg (0.5 mg Intravenous Given 02/23/23 1513)                                                                                                                                      Procedures Procedures  (including critical care time)  Medical Decision Making / ED Course   MDM:  ***      Additional history obtained: -Additional history obtained from {wsadditionalhistorian:28072} -External records from outside source obtained and reviewed including: Chart review including previous notes, labs, imaging, consultation notes including ***   Lab Tests: -I ordered, reviewed, and interpreted labs.   The pertinent results include:   Labs Reviewed  CBC WITH DIFFERENTIAL/PLATELET - Abnormal; Notable for the following components:      Result Value   RBC 3.73 (*)    Hemoglobin 11.1 (*)    HCT 34.1 (*)    All other components within normal limits  COMPREHENSIVE METABOLIC PANEL - Abnormal; Notable for the following components:   BUN 25 (*)    Creatinine, Ser 5.78 (*)    Albumin 3.4 (*)    GFR, Estimated 7 (*)    All other components within normal limits  BRAIN NATRIURETIC PEPTIDE  CBG MONITORING, ED  TROPONIN I (HIGH SENSITIVITY)  TROPONIN I (  HIGH SENSITIVITY)    Notable for ***  EKG   EKG Interpretation  Date/Time:  Friday February 23 2023 09:25:36 EDT Ventricular Rate:  69 PR Interval:  190 QRS Duration: 88 QT Interval:  416 QTC Calculation: 445 R Axis:   -28 Text Interpretation: Normal sinus rhythm Moderate voltage criteria for LVH, may be normal variant ( R in aVL , Cornell product ) Borderline ECG No previous ECGs available Confirmed by Garnette Gunner [phone]) on 02/23/2023 9:44:10 AM         Imaging Studies ordered: I ordered imaging studies including *** On my interpretation imaging demonstrates *** I independently visualized and interpreted imaging. I agree with the radiologist interpretation   Medicines ordered and prescription drug management: Meds ordered this encounter  Medications   DISCONTD: LORazepam (ATIVAN) 2 MG/ML concentrated solution 0.5 mg   LORazepam (ATIVAN) injection 0.5 mg    -I have reviewed the patients home  medicines and have made adjustments as needed   Consultations Obtained: I requested consultation with the ***,  and discussed lab and imaging findings as well as pertinent plan - they recommend: ***   Cardiac Monitoring: The patient was maintained on a cardiac monitor.  I personally viewed and interpreted the cardiac monitored which showed an underlying rhythm of: ***  Social Determinants of Health:  Diagnosis or treatment significantly limited by social determinants of health: {wssoc:28071}   Reevaluation: After the interventions noted above, I reevaluated the patient and found that their symptoms have {resolved/improved/worsened:23923::"improved"}  Co morbidities that complicate the patient evaluation  Past Medical History:  Diagnosis Date   Anemia    Chronic kidney disease (CKD), stage IV (severe) (HCC)    Diabetes mellitus without complication ([place])    Hypertension    Renal disorder       Dispostion: Disposition decision including need for hospitalization was considered, and patient {wsdispo:28070::"discharged from emergency department."}    Final Clinical Impression(s) / ED Diagnoses Final diagnoses:  None     This chart was dictated using voice recognition software.  Despite best efforts to proofread,  errors can occur which can change the documentation meaning.

## 2023-02-23 NOTE — ED Triage Notes (Signed)
Patient is from dialysis treatment center where she suddenly started feeling short of breath, experienced tingling and numbness down the R shoulder and arm. This numbness and tingling that occurred today has been intermittently occurring for the last 3 months, most noteably this happens after dialysis. Patient also notes that its worsened over the last 2 weeks and feeling numb to the R side of face. Patient was unable to complete full session due to feeling this way.Patient receives dialysis through a R side chest port and patient think the issue is originating from the port. Receives dialysis MWF.   VS per EMS:  BP:178/90 HR 64 SpO2- 100%

## 2023-02-25 DIAGNOSIS — N186 End stage renal disease: Secondary | ICD-10-CM | POA: Diagnosis not present

## 2023-02-25 DIAGNOSIS — E1122 Type 2 diabetes mellitus with diabetic chronic kidney disease: Secondary | ICD-10-CM | POA: Diagnosis not present

## 2023-02-25 DIAGNOSIS — Z992 Dependence on renal dialysis: Secondary | ICD-10-CM | POA: Diagnosis not present

## 2023-02-26 DIAGNOSIS — D631 Anemia in chronic kidney disease: Secondary | ICD-10-CM | POA: Diagnosis not present

## 2023-02-26 DIAGNOSIS — N186 End stage renal disease: Secondary | ICD-10-CM | POA: Diagnosis not present

## 2023-02-26 DIAGNOSIS — D689 Coagulation defect, unspecified: Secondary | ICD-10-CM | POA: Diagnosis not present

## 2023-02-26 DIAGNOSIS — Z992 Dependence on renal dialysis: Secondary | ICD-10-CM | POA: Diagnosis not present

## 2023-02-26 DIAGNOSIS — E876 Hypokalemia: Secondary | ICD-10-CM | POA: Diagnosis not present

## 2023-02-26 DIAGNOSIS — T7840XD Allergy, unspecified, subsequent encounter: Secondary | ICD-10-CM | POA: Diagnosis not present

## 2023-02-27 IMAGING — CR DG CHEST 2V
2 series · 2 of 2 positions shown · non-contrast
Comparison: July 01, 2021.

CLINICAL DATA: Shortness of breath in a 75-year-old female soft.

EXAM:
CHEST - 2 VIEW

[chest pa]
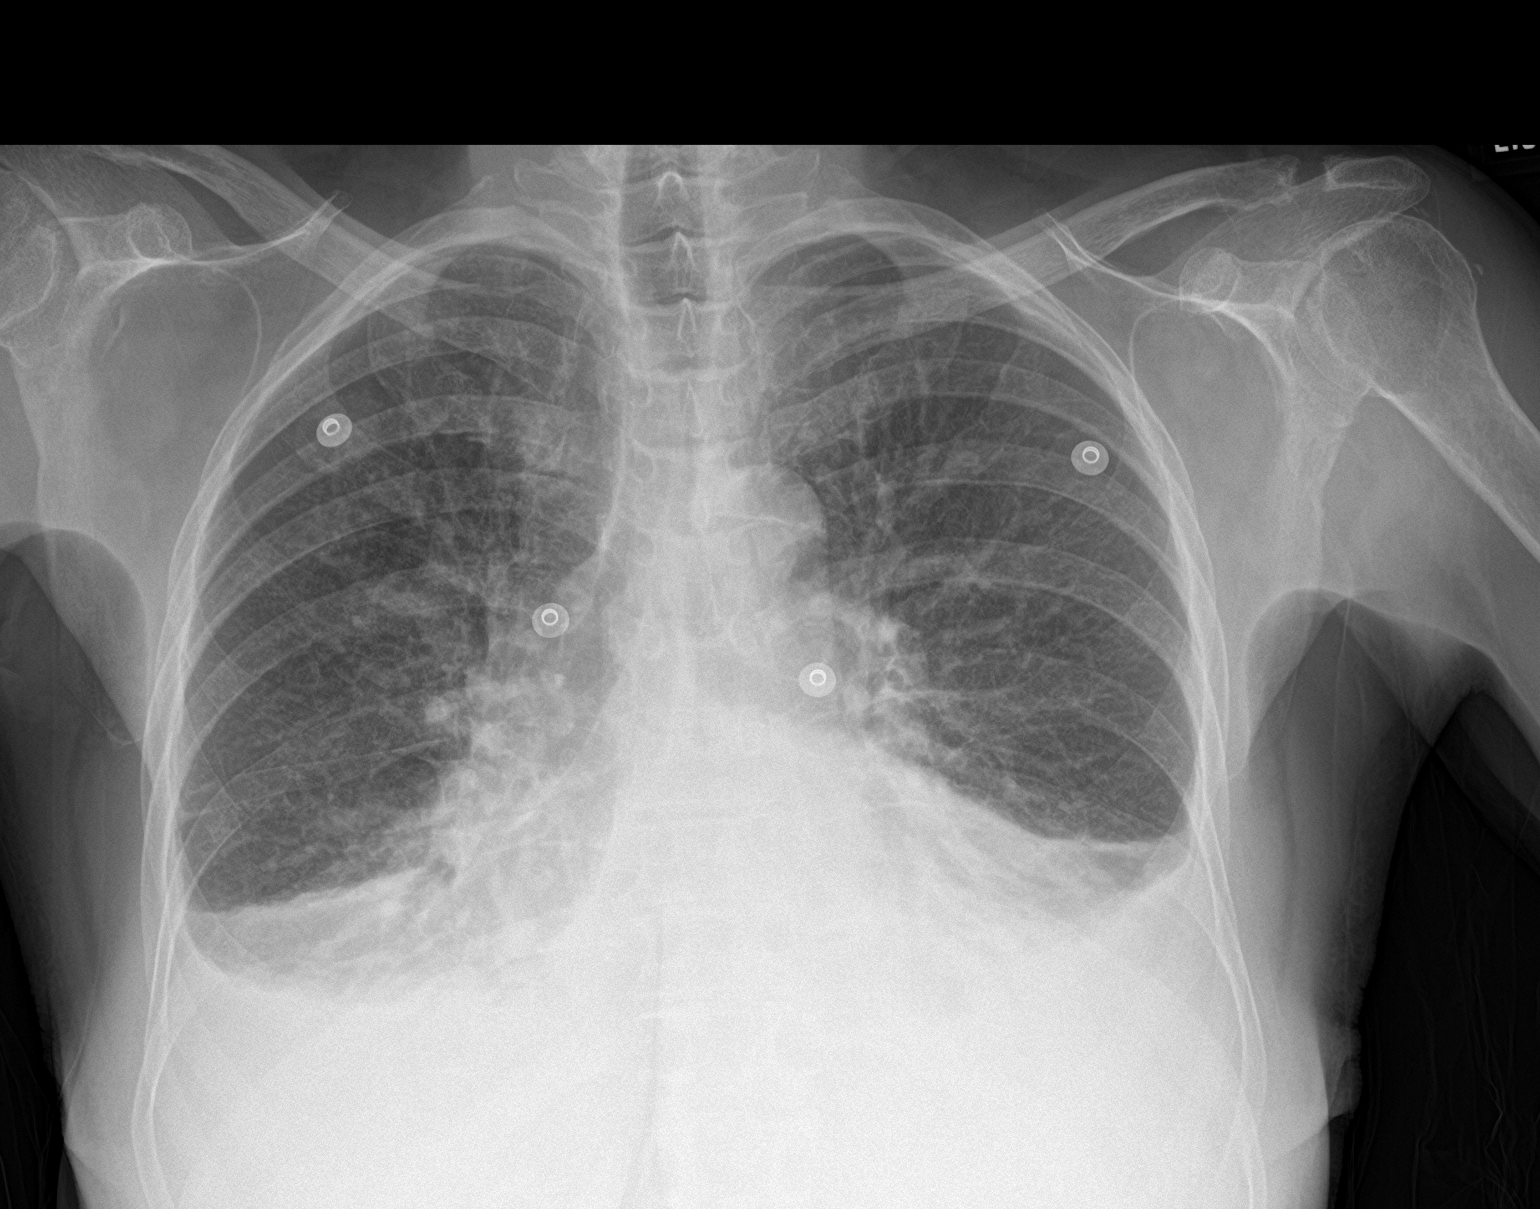

[chest lat]
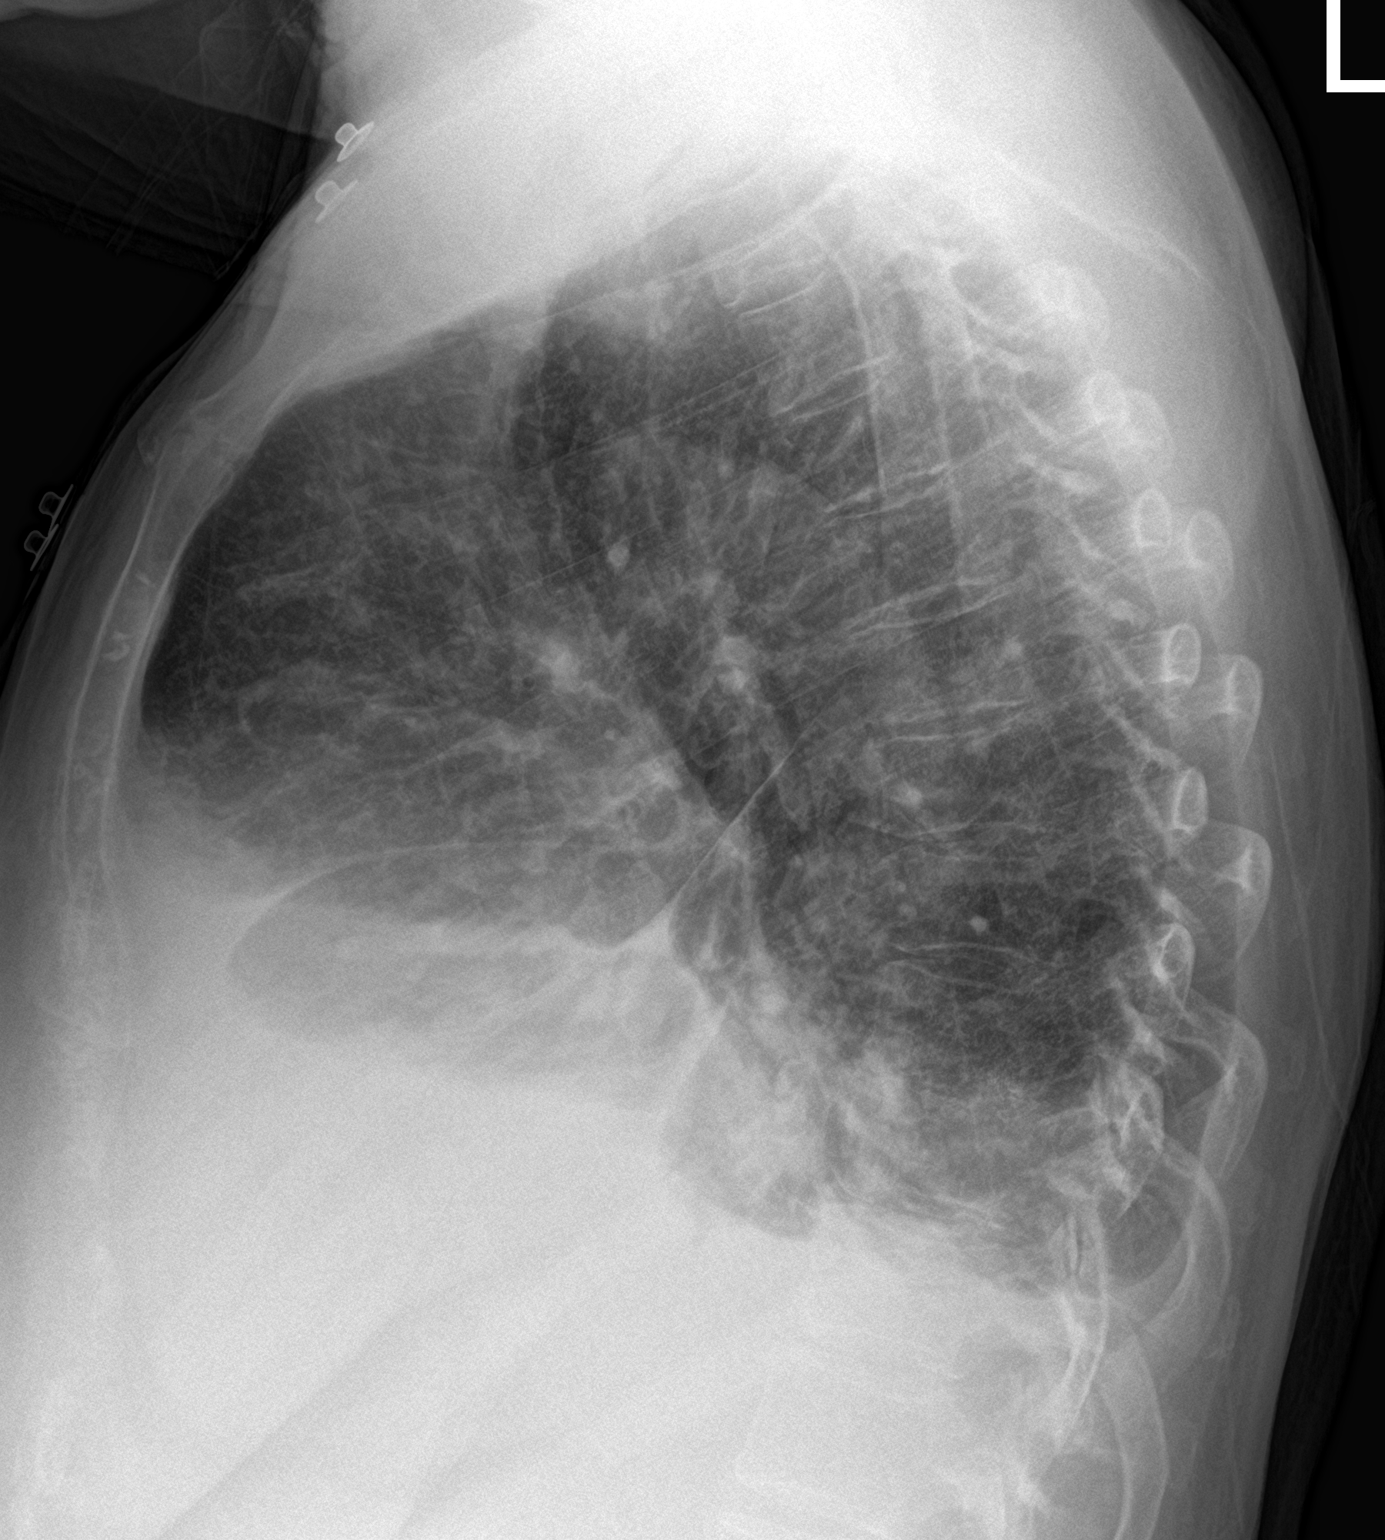

[2 of 2 positions shown; findings below may reference images not displayed]

FINDINGS: Cardiomediastinal contours are partially obscured by airspace
disease and effusions at the lung bases. Contours grossly stable in
unobscured portions compared to previous imaging in this patient
with cardiomegaly noted on prior imaging.

Obscured LEFT and RIGHT hemidiaphragm. Lungs are otherwise clear
with mild increased interstitial prominence.

Signs of osteopenia. No acute musculoskeletal finding to the extent
evaluated.
IMPRESSION: 1. Bilateral effusions and basilar airspace disease likely related
to heart failure with basilar volume loss.
2. Increased interstitial markings albeit mild may reflect mild
edema. Correlate with evidence of CHF.

## 2023-02-28 DIAGNOSIS — E876 Hypokalemia: Secondary | ICD-10-CM | POA: Diagnosis not present

## 2023-02-28 DIAGNOSIS — Z992 Dependence on renal dialysis: Secondary | ICD-10-CM | POA: Diagnosis not present

## 2023-02-28 DIAGNOSIS — T7840XD Allergy, unspecified, subsequent encounter: Secondary | ICD-10-CM | POA: Diagnosis not present

## 2023-02-28 DIAGNOSIS — D631 Anemia in chronic kidney disease: Secondary | ICD-10-CM | POA: Diagnosis not present

## 2023-02-28 DIAGNOSIS — D689 Coagulation defect, unspecified: Secondary | ICD-10-CM | POA: Diagnosis not present

## 2023-02-28 DIAGNOSIS — N186 End stage renal disease: Secondary | ICD-10-CM | POA: Diagnosis not present

## 2023-03-02 DIAGNOSIS — D631 Anemia in chronic kidney disease: Secondary | ICD-10-CM | POA: Diagnosis not present

## 2023-03-02 DIAGNOSIS — N186 End stage renal disease: Secondary | ICD-10-CM | POA: Diagnosis not present

## 2023-03-02 DIAGNOSIS — T7840XD Allergy, unspecified, subsequent encounter: Secondary | ICD-10-CM | POA: Diagnosis not present

## 2023-03-02 DIAGNOSIS — E876 Hypokalemia: Secondary | ICD-10-CM | POA: Diagnosis not present

## 2023-03-02 DIAGNOSIS — Z992 Dependence on renal dialysis: Secondary | ICD-10-CM | POA: Diagnosis not present

## 2023-03-02 DIAGNOSIS — D689 Coagulation defect, unspecified: Secondary | ICD-10-CM | POA: Diagnosis not present

## 2023-03-02 IMAGING — DX DG CHEST 1V PORT
1 series · 1 of 1 positions shown · non-contrast
Comparison: 12/19/2021

CLINICAL DATA: Shortness of breath

EXAM:
PORTABLE CHEST 1 VIEW

[chest]
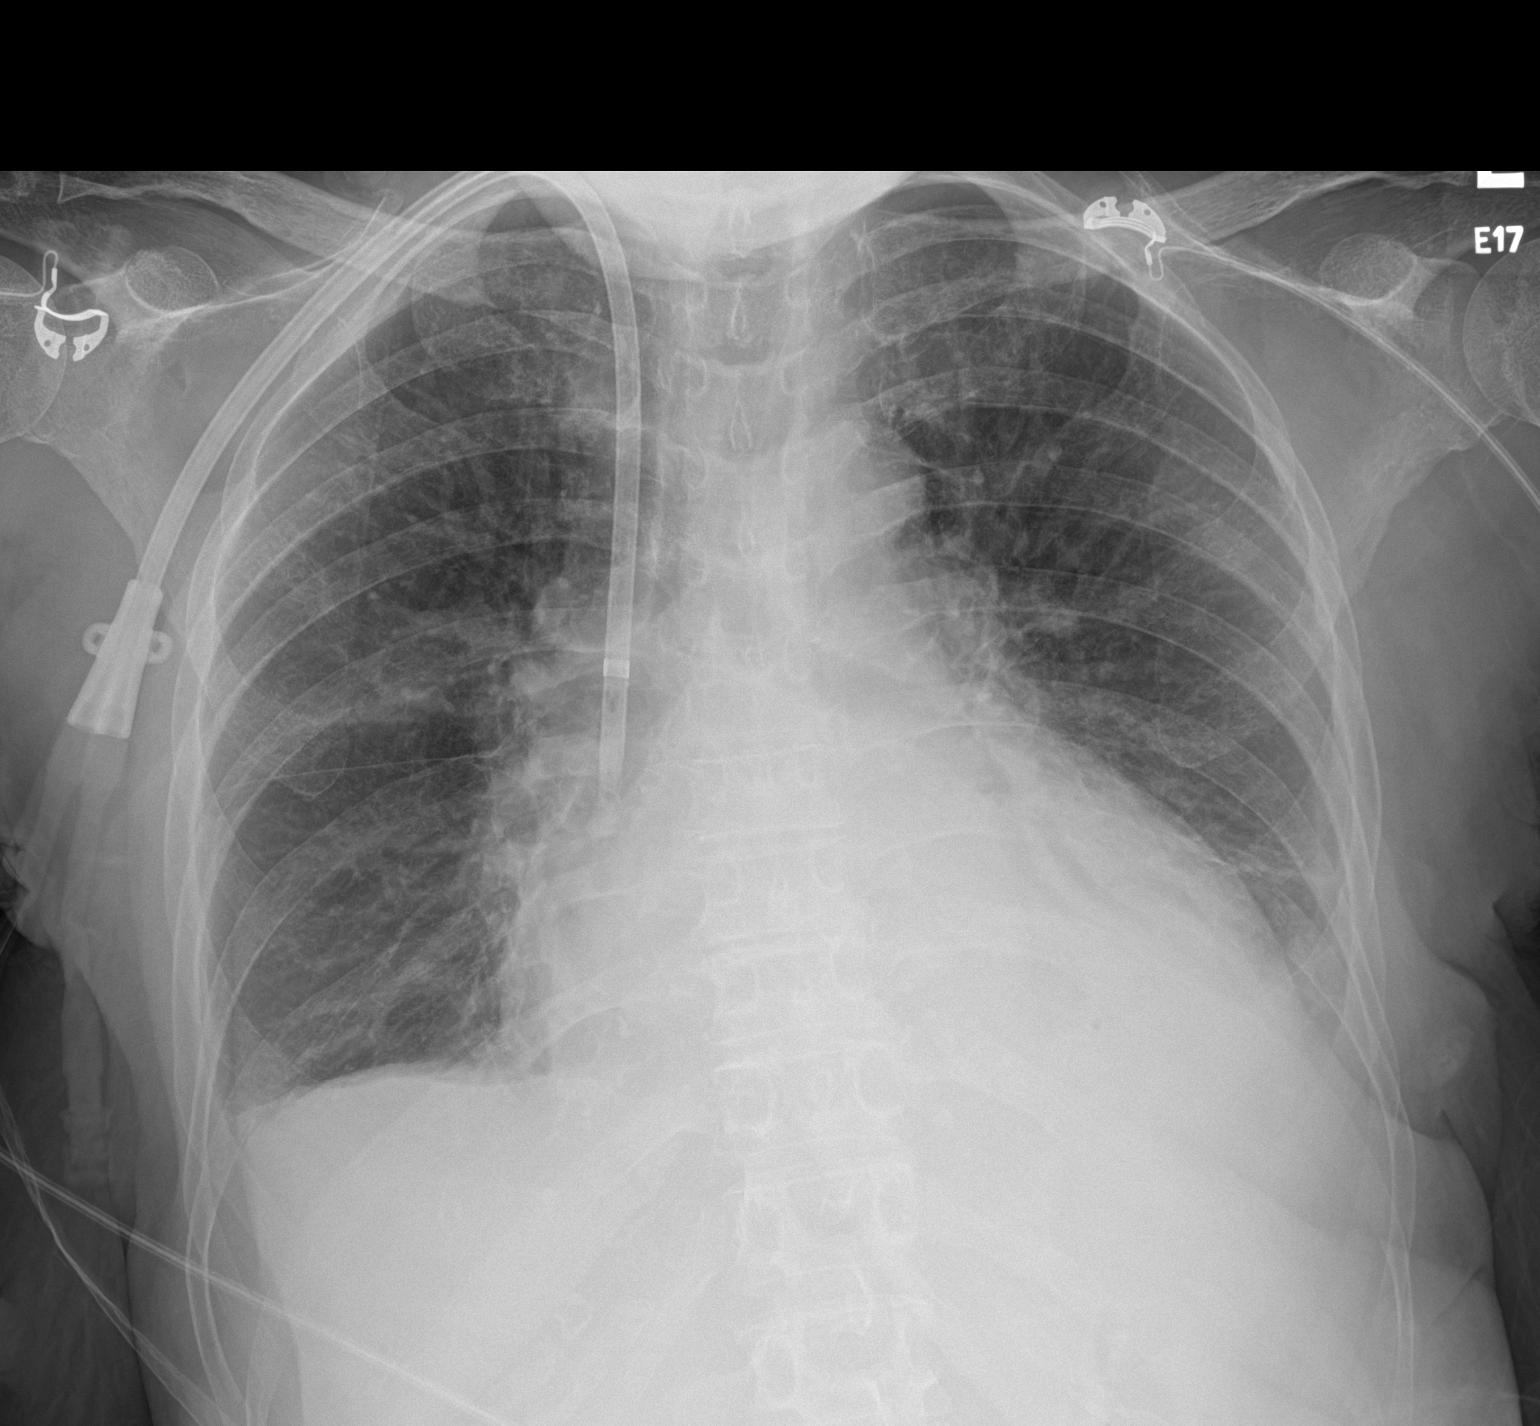

[1 of 1 positions shown; findings below may reference images not displayed]

FINDINGS: Transverse diameter of heart is increased. Central pulmonary vessels
are prominent. Increased density is seen in the left lower lung
fields obscuring left hemidiaphragm. There is blunting of both
lateral CP angles. There is improvement in aeration of right lower
lung fields. There is interval placement of right IJ dialysis
catheter with its tip in superior vena cava close to the right
atrium.
IMPRESSION: Cardiomegaly. Central pulmonary vessels are prominent without signs
of alveolar pulmonary edema. Increased density in the left lower
lung fields obscuring the left hemidiaphragm suggests pleural
effusion and possibly underlying atelectasis/pneumonia. There is
interval improvement in aeration of right lower lung fields.
Blunting of right lateral CP angle suggests small right pleural
effusion.

## 2023-03-05 DIAGNOSIS — E876 Hypokalemia: Secondary | ICD-10-CM | POA: Diagnosis not present

## 2023-03-05 DIAGNOSIS — D689 Coagulation defect, unspecified: Secondary | ICD-10-CM | POA: Diagnosis not present

## 2023-03-05 DIAGNOSIS — N186 End stage renal disease: Secondary | ICD-10-CM | POA: Diagnosis not present

## 2023-03-05 DIAGNOSIS — D631 Anemia in chronic kidney disease: Secondary | ICD-10-CM | POA: Diagnosis not present

## 2023-03-05 DIAGNOSIS — Z992 Dependence on renal dialysis: Secondary | ICD-10-CM | POA: Diagnosis not present

## 2023-03-05 DIAGNOSIS — T7840XD Allergy, unspecified, subsequent encounter: Secondary | ICD-10-CM | POA: Diagnosis not present

## 2023-03-07 DIAGNOSIS — Z992 Dependence on renal dialysis: Secondary | ICD-10-CM | POA: Diagnosis not present

## 2023-03-07 DIAGNOSIS — D689 Coagulation defect, unspecified: Secondary | ICD-10-CM | POA: Diagnosis not present

## 2023-03-07 DIAGNOSIS — T7840XD Allergy, unspecified, subsequent encounter: Secondary | ICD-10-CM | POA: Diagnosis not present

## 2023-03-07 DIAGNOSIS — E876 Hypokalemia: Secondary | ICD-10-CM | POA: Diagnosis not present

## 2023-03-07 DIAGNOSIS — D631 Anemia in chronic kidney disease: Secondary | ICD-10-CM | POA: Diagnosis not present

## 2023-03-07 DIAGNOSIS — H6121 Impacted cerumen, right ear: Secondary | ICD-10-CM | POA: Diagnosis not present

## 2023-03-07 DIAGNOSIS — N186 End stage renal disease: Secondary | ICD-10-CM | POA: Diagnosis not present

## 2023-03-09 DIAGNOSIS — T7840XD Allergy, unspecified, subsequent encounter: Secondary | ICD-10-CM | POA: Diagnosis not present

## 2023-03-09 DIAGNOSIS — N186 End stage renal disease: Secondary | ICD-10-CM | POA: Diagnosis not present

## 2023-03-09 DIAGNOSIS — D631 Anemia in chronic kidney disease: Secondary | ICD-10-CM | POA: Diagnosis not present

## 2023-03-09 DIAGNOSIS — D689 Coagulation defect, unspecified: Secondary | ICD-10-CM | POA: Diagnosis not present

## 2023-03-09 DIAGNOSIS — E876 Hypokalemia: Secondary | ICD-10-CM | POA: Diagnosis not present

## 2023-03-09 DIAGNOSIS — Z992 Dependence on renal dialysis: Secondary | ICD-10-CM | POA: Diagnosis not present

## 2023-03-12 DIAGNOSIS — D631 Anemia in chronic kidney disease: Secondary | ICD-10-CM | POA: Diagnosis not present

## 2023-03-12 DIAGNOSIS — E876 Hypokalemia: Secondary | ICD-10-CM | POA: Diagnosis not present

## 2023-03-12 DIAGNOSIS — N186 End stage renal disease: Secondary | ICD-10-CM | POA: Diagnosis not present

## 2023-03-12 DIAGNOSIS — Z992 Dependence on renal dialysis: Secondary | ICD-10-CM | POA: Diagnosis not present

## 2023-03-12 DIAGNOSIS — D689 Coagulation defect, unspecified: Secondary | ICD-10-CM | POA: Diagnosis not present

## 2023-03-12 DIAGNOSIS — T7840XD Allergy, unspecified, subsequent encounter: Secondary | ICD-10-CM | POA: Diagnosis not present

## 2023-03-14 DIAGNOSIS — T7840XD Allergy, unspecified, subsequent encounter: Secondary | ICD-10-CM | POA: Diagnosis not present

## 2023-03-14 DIAGNOSIS — N186 End stage renal disease: Secondary | ICD-10-CM | POA: Diagnosis not present

## 2023-03-14 DIAGNOSIS — D631 Anemia in chronic kidney disease: Secondary | ICD-10-CM | POA: Diagnosis not present

## 2023-03-14 DIAGNOSIS — E876 Hypokalemia: Secondary | ICD-10-CM | POA: Diagnosis not present

## 2023-03-14 DIAGNOSIS — D689 Coagulation defect, unspecified: Secondary | ICD-10-CM | POA: Diagnosis not present

## 2023-03-14 DIAGNOSIS — Z992 Dependence on renal dialysis: Secondary | ICD-10-CM | POA: Diagnosis not present

## 2023-03-16 DIAGNOSIS — E876 Hypokalemia: Secondary | ICD-10-CM | POA: Diagnosis not present

## 2023-03-16 DIAGNOSIS — Z992 Dependence on renal dialysis: Secondary | ICD-10-CM | POA: Diagnosis not present

## 2023-03-16 DIAGNOSIS — N186 End stage renal disease: Secondary | ICD-10-CM | POA: Diagnosis not present

## 2023-03-16 DIAGNOSIS — D631 Anemia in chronic kidney disease: Secondary | ICD-10-CM | POA: Diagnosis not present

## 2023-03-16 DIAGNOSIS — D689 Coagulation defect, unspecified: Secondary | ICD-10-CM | POA: Diagnosis not present

## 2023-03-16 DIAGNOSIS — T7840XD Allergy, unspecified, subsequent encounter: Secondary | ICD-10-CM | POA: Diagnosis not present

## 2023-03-19 DIAGNOSIS — D631 Anemia in chronic kidney disease: Secondary | ICD-10-CM | POA: Diagnosis not present

## 2023-03-19 DIAGNOSIS — Z992 Dependence on renal dialysis: Secondary | ICD-10-CM | POA: Diagnosis not present

## 2023-03-19 DIAGNOSIS — D689 Coagulation defect, unspecified: Secondary | ICD-10-CM | POA: Diagnosis not present

## 2023-03-19 DIAGNOSIS — T7840XD Allergy, unspecified, subsequent encounter: Secondary | ICD-10-CM | POA: Diagnosis not present

## 2023-03-19 DIAGNOSIS — E876 Hypokalemia: Secondary | ICD-10-CM | POA: Diagnosis not present

## 2023-03-19 DIAGNOSIS — N186 End stage renal disease: Secondary | ICD-10-CM | POA: Diagnosis not present

## 2023-03-21 DIAGNOSIS — T7840XD Allergy, unspecified, subsequent encounter: Secondary | ICD-10-CM | POA: Diagnosis not present

## 2023-03-21 DIAGNOSIS — Z4901 Encounter for fitting and adjustment of extracorporeal dialysis catheter: Secondary | ICD-10-CM | POA: Diagnosis not present

## 2023-03-21 DIAGNOSIS — Z992 Dependence on renal dialysis: Secondary | ICD-10-CM | POA: Diagnosis not present

## 2023-03-21 DIAGNOSIS — N186 End stage renal disease: Secondary | ICD-10-CM | POA: Diagnosis not present

## 2023-03-21 DIAGNOSIS — D631 Anemia in chronic kidney disease: Secondary | ICD-10-CM | POA: Diagnosis not present

## 2023-03-21 DIAGNOSIS — E876 Hypokalemia: Secondary | ICD-10-CM | POA: Diagnosis not present

## 2023-03-21 DIAGNOSIS — D689 Coagulation defect, unspecified: Secondary | ICD-10-CM | POA: Diagnosis not present

## 2023-03-22 DIAGNOSIS — T8249XA Other complication of vascular dialysis catheter, initial encounter: Secondary | ICD-10-CM | POA: Diagnosis not present

## 2023-03-22 DIAGNOSIS — Z992 Dependence on renal dialysis: Secondary | ICD-10-CM | POA: Diagnosis not present

## 2023-03-22 DIAGNOSIS — N186 End stage renal disease: Secondary | ICD-10-CM | POA: Diagnosis not present

## 2023-03-24 DIAGNOSIS — D689 Coagulation defect, unspecified: Secondary | ICD-10-CM | POA: Diagnosis not present

## 2023-03-24 DIAGNOSIS — T7840XD Allergy, unspecified, subsequent encounter: Secondary | ICD-10-CM | POA: Diagnosis not present

## 2023-03-24 DIAGNOSIS — E876 Hypokalemia: Secondary | ICD-10-CM | POA: Diagnosis not present

## 2023-03-24 DIAGNOSIS — Z992 Dependence on renal dialysis: Secondary | ICD-10-CM | POA: Diagnosis not present

## 2023-03-24 DIAGNOSIS — N186 End stage renal disease: Secondary | ICD-10-CM | POA: Diagnosis not present

## 2023-03-24 DIAGNOSIS — D631 Anemia in chronic kidney disease: Secondary | ICD-10-CM | POA: Diagnosis not present

## 2023-03-26 DIAGNOSIS — N186 End stage renal disease: Secondary | ICD-10-CM | POA: Diagnosis not present

## 2023-03-26 DIAGNOSIS — E876 Hypokalemia: Secondary | ICD-10-CM | POA: Diagnosis not present

## 2023-03-26 DIAGNOSIS — T7840XD Allergy, unspecified, subsequent encounter: Secondary | ICD-10-CM | POA: Diagnosis not present

## 2023-03-26 DIAGNOSIS — D631 Anemia in chronic kidney disease: Secondary | ICD-10-CM | POA: Diagnosis not present

## 2023-03-26 DIAGNOSIS — D689 Coagulation defect, unspecified: Secondary | ICD-10-CM | POA: Diagnosis not present

## 2023-03-26 DIAGNOSIS — Z992 Dependence on renal dialysis: Secondary | ICD-10-CM | POA: Diagnosis not present

## 2023-03-27 DIAGNOSIS — Z992 Dependence on renal dialysis: Secondary | ICD-10-CM | POA: Diagnosis not present

## 2023-03-27 DIAGNOSIS — N186 End stage renal disease: Secondary | ICD-10-CM | POA: Diagnosis not present

## 2023-03-27 DIAGNOSIS — E1122 Type 2 diabetes mellitus with diabetic chronic kidney disease: Secondary | ICD-10-CM | POA: Diagnosis not present

## 2023-03-28 DIAGNOSIS — D631 Anemia in chronic kidney disease: Secondary | ICD-10-CM | POA: Diagnosis not present

## 2023-03-28 DIAGNOSIS — E876 Hypokalemia: Secondary | ICD-10-CM | POA: Diagnosis not present

## 2023-03-28 DIAGNOSIS — Z992 Dependence on renal dialysis: Secondary | ICD-10-CM | POA: Diagnosis not present

## 2023-03-28 DIAGNOSIS — N2581 Secondary hyperparathyroidism of renal origin: Secondary | ICD-10-CM | POA: Diagnosis not present

## 2023-03-28 DIAGNOSIS — D689 Coagulation defect, unspecified: Secondary | ICD-10-CM | POA: Diagnosis not present

## 2023-03-28 DIAGNOSIS — T7840XD Allergy, unspecified, subsequent encounter: Secondary | ICD-10-CM | POA: Diagnosis not present

## 2023-03-28 DIAGNOSIS — N186 End stage renal disease: Secondary | ICD-10-CM | POA: Diagnosis not present

## 2023-04-02 DIAGNOSIS — N2581 Secondary hyperparathyroidism of renal origin: Secondary | ICD-10-CM | POA: Diagnosis not present

## 2023-04-02 DIAGNOSIS — D631 Anemia in chronic kidney disease: Secondary | ICD-10-CM | POA: Diagnosis not present

## 2023-04-02 DIAGNOSIS — E876 Hypokalemia: Secondary | ICD-10-CM | POA: Diagnosis not present

## 2023-04-02 DIAGNOSIS — Z992 Dependence on renal dialysis: Secondary | ICD-10-CM | POA: Diagnosis not present

## 2023-04-02 DIAGNOSIS — D689 Coagulation defect, unspecified: Secondary | ICD-10-CM | POA: Diagnosis not present

## 2023-04-02 DIAGNOSIS — T7840XD Allergy, unspecified, subsequent encounter: Secondary | ICD-10-CM | POA: Diagnosis not present

## 2023-04-02 DIAGNOSIS — N186 End stage renal disease: Secondary | ICD-10-CM | POA: Diagnosis not present

## 2023-04-04 DIAGNOSIS — N2581 Secondary hyperparathyroidism of renal origin: Secondary | ICD-10-CM | POA: Diagnosis not present

## 2023-04-04 DIAGNOSIS — E876 Hypokalemia: Secondary | ICD-10-CM | POA: Diagnosis not present

## 2023-04-04 DIAGNOSIS — T7840XD Allergy, unspecified, subsequent encounter: Secondary | ICD-10-CM | POA: Diagnosis not present

## 2023-04-04 DIAGNOSIS — Z992 Dependence on renal dialysis: Secondary | ICD-10-CM | POA: Diagnosis not present

## 2023-04-04 DIAGNOSIS — D631 Anemia in chronic kidney disease: Secondary | ICD-10-CM | POA: Diagnosis not present

## 2023-04-04 DIAGNOSIS — D689 Coagulation defect, unspecified: Secondary | ICD-10-CM | POA: Diagnosis not present

## 2023-04-04 DIAGNOSIS — N186 End stage renal disease: Secondary | ICD-10-CM | POA: Diagnosis not present

## 2023-04-06 DIAGNOSIS — N2581 Secondary hyperparathyroidism of renal origin: Secondary | ICD-10-CM | POA: Diagnosis not present

## 2023-04-06 DIAGNOSIS — M79622 Pain in left upper arm: Secondary | ICD-10-CM | POA: Diagnosis not present

## 2023-04-06 DIAGNOSIS — D689 Coagulation defect, unspecified: Secondary | ICD-10-CM | POA: Diagnosis not present

## 2023-04-06 DIAGNOSIS — D631 Anemia in chronic kidney disease: Secondary | ICD-10-CM | POA: Diagnosis not present

## 2023-04-06 DIAGNOSIS — E876 Hypokalemia: Secondary | ICD-10-CM | POA: Diagnosis not present

## 2023-04-06 DIAGNOSIS — I10 Essential (primary) hypertension: Secondary | ICD-10-CM | POA: Diagnosis not present

## 2023-04-06 DIAGNOSIS — Z992 Dependence on renal dialysis: Secondary | ICD-10-CM | POA: Diagnosis not present

## 2023-04-06 DIAGNOSIS — N186 End stage renal disease: Secondary | ICD-10-CM | POA: Diagnosis not present

## 2023-04-06 DIAGNOSIS — T7840XD Allergy, unspecified, subsequent encounter: Secondary | ICD-10-CM | POA: Diagnosis not present

## 2023-04-09 ENCOUNTER — Other Ambulatory Visit: Payer: Self-pay | Admitting: Family Medicine

## 2023-04-09 DIAGNOSIS — D689 Coagulation defect, unspecified: Secondary | ICD-10-CM | POA: Diagnosis not present

## 2023-04-09 DIAGNOSIS — N186 End stage renal disease: Secondary | ICD-10-CM | POA: Diagnosis not present

## 2023-04-09 DIAGNOSIS — N2581 Secondary hyperparathyroidism of renal origin: Secondary | ICD-10-CM | POA: Diagnosis not present

## 2023-04-09 DIAGNOSIS — T7840XD Allergy, unspecified, subsequent encounter: Secondary | ICD-10-CM | POA: Diagnosis not present

## 2023-04-09 DIAGNOSIS — D631 Anemia in chronic kidney disease: Secondary | ICD-10-CM | POA: Diagnosis not present

## 2023-04-09 DIAGNOSIS — E876 Hypokalemia: Secondary | ICD-10-CM | POA: Diagnosis not present

## 2023-04-09 DIAGNOSIS — R202 Paresthesia of skin: Secondary | ICD-10-CM

## 2023-04-09 DIAGNOSIS — Z992 Dependence on renal dialysis: Secondary | ICD-10-CM | POA: Diagnosis not present

## 2023-04-11 ENCOUNTER — Encounter: Payer: Self-pay | Admitting: Neurology

## 2023-04-11 DIAGNOSIS — N186 End stage renal disease: Secondary | ICD-10-CM | POA: Diagnosis not present

## 2023-04-11 DIAGNOSIS — D689 Coagulation defect, unspecified: Secondary | ICD-10-CM | POA: Diagnosis not present

## 2023-04-11 DIAGNOSIS — T7840XD Allergy, unspecified, subsequent encounter: Secondary | ICD-10-CM | POA: Diagnosis not present

## 2023-04-11 DIAGNOSIS — N2581 Secondary hyperparathyroidism of renal origin: Secondary | ICD-10-CM | POA: Diagnosis not present

## 2023-04-11 DIAGNOSIS — D631 Anemia in chronic kidney disease: Secondary | ICD-10-CM | POA: Diagnosis not present

## 2023-04-11 DIAGNOSIS — E876 Hypokalemia: Secondary | ICD-10-CM | POA: Diagnosis not present

## 2023-04-11 DIAGNOSIS — Z992 Dependence on renal dialysis: Secondary | ICD-10-CM | POA: Diagnosis not present

## 2023-04-13 DIAGNOSIS — N2581 Secondary hyperparathyroidism of renal origin: Secondary | ICD-10-CM | POA: Diagnosis not present

## 2023-04-13 DIAGNOSIS — D689 Coagulation defect, unspecified: Secondary | ICD-10-CM | POA: Diagnosis not present

## 2023-04-13 DIAGNOSIS — D631 Anemia in chronic kidney disease: Secondary | ICD-10-CM | POA: Diagnosis not present

## 2023-04-13 DIAGNOSIS — E876 Hypokalemia: Secondary | ICD-10-CM | POA: Diagnosis not present

## 2023-04-13 DIAGNOSIS — N186 End stage renal disease: Secondary | ICD-10-CM | POA: Diagnosis not present

## 2023-04-13 DIAGNOSIS — T7840XD Allergy, unspecified, subsequent encounter: Secondary | ICD-10-CM | POA: Diagnosis not present

## 2023-04-13 DIAGNOSIS — Z992 Dependence on renal dialysis: Secondary | ICD-10-CM | POA: Diagnosis not present

## 2023-04-16 ENCOUNTER — Encounter: Payer: Self-pay | Admitting: Neurology

## 2023-04-16 ENCOUNTER — Ambulatory Visit: Payer: Medicare Other | Admitting: Neurology

## 2023-04-16 VITALS — BP 113/66 | HR 83 | Ht 63.0 in | Wt 141.0 lb

## 2023-04-16 DIAGNOSIS — N2581 Secondary hyperparathyroidism of renal origin: Secondary | ICD-10-CM | POA: Diagnosis not present

## 2023-04-16 DIAGNOSIS — R202 Paresthesia of skin: Secondary | ICD-10-CM | POA: Diagnosis not present

## 2023-04-16 DIAGNOSIS — D631 Anemia in chronic kidney disease: Secondary | ICD-10-CM | POA: Diagnosis not present

## 2023-04-16 DIAGNOSIS — Z992 Dependence on renal dialysis: Secondary | ICD-10-CM | POA: Diagnosis not present

## 2023-04-16 DIAGNOSIS — E876 Hypokalemia: Secondary | ICD-10-CM | POA: Diagnosis not present

## 2023-04-16 DIAGNOSIS — N186 End stage renal disease: Secondary | ICD-10-CM | POA: Diagnosis not present

## 2023-04-16 DIAGNOSIS — T7840XD Allergy, unspecified, subsequent encounter: Secondary | ICD-10-CM | POA: Diagnosis not present

## 2023-04-16 DIAGNOSIS — D689 Coagulation defect, unspecified: Secondary | ICD-10-CM | POA: Diagnosis not present

## 2023-04-16 NOTE — Patient Instructions (Addendum)
Nerve testing of the right arm will be ordered.   ELECTROMYOGRAM AND NERVE CONDUCTION STUDIES (EMG/NCS) INSTRUCTIONS  How to Prepare The neurologist conducting the EMG will need to know if you have certain medical conditions. Tell the neurologist and other EMG lab personnel if you: Have a pacemaker or any other electrical medical device Take blood-thinning medications Have hemophilia, a blood-clotting disorder that causes prolonged bleeding Bathing Take a shower or bath shortly before your exam in order to remove oils from your skin. Don't apply lotions or creams before the exam.  What to Expect You'll likely be asked to change into a hospital gown for the procedure and lie down on an examination table. The following explanations can help you understand what will happen during the exam.  Electrodes. The neurologist or a technician places surface electrodes at various locations on your skin depending on where you're experiencing symptoms. Or the neurologist may insert needle electrodes at different sites depending on your symptoms.  Sensations. The electrodes will at times transmit a tiny electrical current that you may feel as a twinge or spasm. The needle electrode may cause discomfort or pain that usually ends shortly after the needle is removed. If you are concerned about discomfort or pain, you may want to talk to the neurologist about taking a short break during the exam.  Instructions. During the needle EMG, the neurologist will assess whether there is any spontaneous electrical activity when the muscle is at rest - activity that isn't present in healthy muscle tissue - and the degree of activity when you slightly contract the muscle.  He or she will give you instructions on resting and contracting a muscle at appropriate times. Depending on what muscles and nerves the neurologist is examining, he or she may ask you to change positions during the exam.  After your EMG You may experience some  temporary, minor bruising where the needle electrode was inserted into your muscle. This bruising should fade within several days. If it persists, contact your primary care doctor.

## 2023-04-16 NOTE — Progress Notes (Unsigned)
[facility] Clinic Note - Initial Visit   Date: 04/16/2023   Jackie Wade MRN: [id number] DOB: May 05, 1946   Dear Derenda Mis, FNP:   Thank you for your kind referral of Jackie Wade for consultation of right arm tingling. Although her history is well known to you, please allow [place] to reiterate it for the purpose of our medical record. The patient was accompanied to the clinic by self.    Jackie Wade is a 77 y.o. right-handed female with ESRD on hemodialysis, hypertension, and diabetes mellitus presenting for evaluation of right arm tingling.   IMPRESSION/PLAN: Right arm paresthesias.  Ddx: cervical radiculopathy vs entrapment neuropathy - NCS/EMG of the right arm to help localize symptoms  Further recommendations pending results.   ------------------------------------------------------------- History of present illness: For the past several months, she has intermittent tingling in the right arm which is worse at night time.  Symptoms involve the upper arm, forearm, and hand.  It prevents her from sleeping on her right side.  She denies weakness or neck pain.   She has been approved for kidney transplant, however, this has been put on hold until her neurological symptoms are evaluated for.   She works as an Pensions consultant.  Out-side paper records, electronic medical record, and images have been reviewed where available and summarized as:  MRI brain wo contast 02/23/2023: No acute intracranial abnormality.    Lab Results  Component Value Date   HGBA1C 6.1 (H) 12/19/2021   No results found for: "VITAMINB12" No results found for: "TSH" No results found for: "ESRSEDRATE", "POCTSEDRATE"  Past Medical History:  Diagnosis Date   Anemia    Chronic kidney disease (CKD), stage IV (severe) (HCC)    Diabetes mellitus without complication (HCC)    Hypertension    Renal disorder     Past Surgical History:  Procedure Laterality Date    BASCILIC VEIN TRANSPOSITION Left 07/04/2021   Procedure: LEFT ARM BASCILIC VEIN TRANSPOSITION 1ST STAGE;  Surgeon: Sherren Kerns, MD;  [facility];  Service: Vascular;  Laterality: Left;   BASCILIC VEIN TRANSPOSITION Left 12/22/2021   Procedure: LEFT ARM SECOND STAGE BASILIC VEIN TRANSPOSITION;  Surgeon: Leonie Douglas, MD;  [facility];  Service: Vascular;  Laterality: Left;   I & D EXTREMITY Left 12/22/2021   Procedure: IRRIGATION AND DEBRIDEMENT HEMATOMA;  Surgeon: Leonie Douglas, MD;  [facility];  Service: Vascular;  Laterality: Left;   INSERTION OF DIALYSIS CATHETER Right 12/22/2021   Procedure: INSERTION OF 14.5 FR/CH X 19 CM  DIALYSIS CATHETER;  Surgeon: Leonie Douglas, MD;  [facility];  Service: Vascular;  Laterality: Right;   WRIST SURGERY       Medications:  Outpatient Encounter Medications as of 04/16/2023  Medication Sig   acetaminophen (TYLENOL) 325 MG tablet Take 2 tablets (650 mg total) by mouth every 4 (four) hours as needed for headache or mild pain.   carvedilol (COREG) 6.25 MG tablet Take 1 tablet (6.25 mg total) by mouth 2 (two) times daily with a meal.   JANUVIA 100 MG tablet Take 100 mg by mouth daily.   rosuvastatin (CRESTOR) 5 MG tablet Take 1 tablet (5 mg total) by mouth daily. (Patient taking differently: Take 5 mg by mouth daily. One tablet twice a day)   sevelamer carbonate (RENVELA) 800 MG tablet Take by mouth.   [DISCONTINUED] furosemide (LASIX) 40 MG tablet Please take on non dialysis days. (Patient not taking: Reported on 04/16/2023)   [DISCONTINUED] insulin  glargine (LANTUS) 100 UNIT/ML Solostar Pen Inject 3 Units into the skin daily.   [DISCONTINUED] sevelamer carbonate (RENVELA) 800 MG tablet Take 800 mg by mouth 3 (three) times daily. (Patient not taking: Reported on 04/16/2023)   No facility-administered encounter medications on file as of 04/16/2023.    Allergies: No Known Allergies  Family History: Family History  Problem  Relation Age of Onset   Alzheimer's disease Mother    Premature CHD Neg Hx     Social History: Social History   Tobacco Use   Smoking status: Former    Types: Cigarettes   Smokeless tobacco: Former  Building services engineer Use: Never used  Substance Use Topics   Alcohol use: Yes    Comment: Occasional Artist   Drug use: No   Social History   Social History Narrative   Right Handed    Lives in a two story home     Vital Signs:  BP 113/66   Pulse 83   Ht 5\' 3"  (1.6 m)   Wt 141 lb (64 kg)   SpO2 98%   BMI 24.98 kg/m    Neurological Exam: MENTAL STATUS including orientation to time, place, person, recent and remote memory, attention span and concentration, language, and fund of knowledge is normal.  Speech is not dysarthric.  CRANIAL NERVES: II:  No visual field defects.     III-IV-VI: Pupils equal round and reactive to light.  Normal conjugate, extra-ocular eye movements in all directions of gaze.  No nystagmus.  No ptosis.   V:  Normal facial sensation.    VII:  Normal facial symmetry and movements.   VIII:  Normal hearing and vestibular function.   IX-X:  Normal palatal movement.   XI:  Normal shoulder shrug and head rotation.   XII:  Normal tongue strength and range of motion, no deviation or fasciculation.  MOTOR:  No atrophy, fasciculations or abnormal movements.  No pronator drift.   Upper Extremity:  Right  Left  Deltoid  5/5   5/5   Biceps  5/5   5/5   Triceps  5/5   5/5   Wrist extensors  5/5   5/5   Wrist flexors  5/5   5/5   Finger extensors  5/5   5/5   Finger flexors  5/5   5/5   Dorsal interossei  5/5   5/5   Abductor pollicis  5/5   5/5   Tone (Ashworth scale)  0  0   Lower Extremity:  Right  Left  Hip flexors  5/5   5/5   Knee flexors  5/5   5/5   Knee extensors  5/5   5/5   Dorsiflexors  5/5   5/5   Plantarflexors  5/5   5/5   Toe extensors  5/5   5/5   Toe flexors  5/5   5/5   Tone (Ashworth scale)  0  0   MSRs:                                            Right        Left brachioradialis 2+  2+  biceps 2+  2+  triceps 2+  2+  patellar 2+  2+  ankle jerk 2+  2+  Hoffman no  no  plantar response down  down  SENSORY:  Normal and symmetric perception of light touch, pinprick, vibration, and temperature   COORDINATION/GAIT: Normal finger-to- nose-finger.  Intact rapid alternating movements bilaterally.    Gait narrow based and stable.  Thank you for allowing me to participate in patient's care.  If I can answer any additional questions, I would be pleased to do so.    Sincerely,    Tangala Wiegert K. Allena Katz, DO

## 2023-04-17 DIAGNOSIS — N186 End stage renal disease: Secondary | ICD-10-CM | POA: Diagnosis not present

## 2023-04-17 DIAGNOSIS — Z01818 Encounter for other preprocedural examination: Secondary | ICD-10-CM | POA: Diagnosis not present

## 2023-04-17 DIAGNOSIS — Z992 Dependence on renal dialysis: Secondary | ICD-10-CM | POA: Diagnosis not present

## 2023-04-18 DIAGNOSIS — D689 Coagulation defect, unspecified: Secondary | ICD-10-CM | POA: Diagnosis not present

## 2023-04-18 DIAGNOSIS — D631 Anemia in chronic kidney disease: Secondary | ICD-10-CM | POA: Diagnosis not present

## 2023-04-18 DIAGNOSIS — N2581 Secondary hyperparathyroidism of renal origin: Secondary | ICD-10-CM | POA: Diagnosis not present

## 2023-04-18 DIAGNOSIS — T7840XD Allergy, unspecified, subsequent encounter: Secondary | ICD-10-CM | POA: Diagnosis not present

## 2023-04-18 DIAGNOSIS — Z992 Dependence on renal dialysis: Secondary | ICD-10-CM | POA: Diagnosis not present

## 2023-04-18 DIAGNOSIS — E876 Hypokalemia: Secondary | ICD-10-CM | POA: Diagnosis not present

## 2023-04-18 DIAGNOSIS — N186 End stage renal disease: Secondary | ICD-10-CM | POA: Diagnosis not present

## 2023-04-20 DIAGNOSIS — D689 Coagulation defect, unspecified: Secondary | ICD-10-CM | POA: Diagnosis not present

## 2023-04-20 DIAGNOSIS — T7840XD Allergy, unspecified, subsequent encounter: Secondary | ICD-10-CM | POA: Diagnosis not present

## 2023-04-20 DIAGNOSIS — D631 Anemia in chronic kidney disease: Secondary | ICD-10-CM | POA: Diagnosis not present

## 2023-04-20 DIAGNOSIS — Z992 Dependence on renal dialysis: Secondary | ICD-10-CM | POA: Diagnosis not present

## 2023-04-20 DIAGNOSIS — N2581 Secondary hyperparathyroidism of renal origin: Secondary | ICD-10-CM | POA: Diagnosis not present

## 2023-04-20 DIAGNOSIS — E876 Hypokalemia: Secondary | ICD-10-CM | POA: Diagnosis not present

## 2023-04-20 DIAGNOSIS — N186 End stage renal disease: Secondary | ICD-10-CM | POA: Diagnosis not present

## 2023-04-23 DIAGNOSIS — D631 Anemia in chronic kidney disease: Secondary | ICD-10-CM | POA: Diagnosis not present

## 2023-04-23 DIAGNOSIS — D689 Coagulation defect, unspecified: Secondary | ICD-10-CM | POA: Diagnosis not present

## 2023-04-23 DIAGNOSIS — Z992 Dependence on renal dialysis: Secondary | ICD-10-CM | POA: Diagnosis not present

## 2023-04-23 DIAGNOSIS — N2581 Secondary hyperparathyroidism of renal origin: Secondary | ICD-10-CM | POA: Diagnosis not present

## 2023-04-23 DIAGNOSIS — T7840XD Allergy, unspecified, subsequent encounter: Secondary | ICD-10-CM | POA: Diagnosis not present

## 2023-04-23 DIAGNOSIS — N186 End stage renal disease: Secondary | ICD-10-CM | POA: Diagnosis not present

## 2023-04-23 DIAGNOSIS — E876 Hypokalemia: Secondary | ICD-10-CM | POA: Diagnosis not present

## 2023-04-25 DIAGNOSIS — Z992 Dependence on renal dialysis: Secondary | ICD-10-CM | POA: Diagnosis not present

## 2023-04-25 DIAGNOSIS — E876 Hypokalemia: Secondary | ICD-10-CM | POA: Diagnosis not present

## 2023-04-25 DIAGNOSIS — T7840XD Allergy, unspecified, subsequent encounter: Secondary | ICD-10-CM | POA: Diagnosis not present

## 2023-04-25 DIAGNOSIS — D689 Coagulation defect, unspecified: Secondary | ICD-10-CM | POA: Diagnosis not present

## 2023-04-25 DIAGNOSIS — N186 End stage renal disease: Secondary | ICD-10-CM | POA: Diagnosis not present

## 2023-04-25 DIAGNOSIS — D631 Anemia in chronic kidney disease: Secondary | ICD-10-CM | POA: Diagnosis not present

## 2023-04-25 DIAGNOSIS — N2581 Secondary hyperparathyroidism of renal origin: Secondary | ICD-10-CM | POA: Diagnosis not present

## 2023-04-27 DIAGNOSIS — N186 End stage renal disease: Secondary | ICD-10-CM | POA: Diagnosis not present

## 2023-04-27 DIAGNOSIS — E1122 Type 2 diabetes mellitus with diabetic chronic kidney disease: Secondary | ICD-10-CM | POA: Diagnosis not present

## 2023-04-27 DIAGNOSIS — T7840XD Allergy, unspecified, subsequent encounter: Secondary | ICD-10-CM | POA: Diagnosis not present

## 2023-04-27 DIAGNOSIS — D689 Coagulation defect, unspecified: Secondary | ICD-10-CM | POA: Diagnosis not present

## 2023-04-27 DIAGNOSIS — D631 Anemia in chronic kidney disease: Secondary | ICD-10-CM | POA: Diagnosis not present

## 2023-04-27 DIAGNOSIS — Z992 Dependence on renal dialysis: Secondary | ICD-10-CM | POA: Diagnosis not present

## 2023-04-27 DIAGNOSIS — N2581 Secondary hyperparathyroidism of renal origin: Secondary | ICD-10-CM | POA: Diagnosis not present

## 2023-04-27 DIAGNOSIS — E876 Hypokalemia: Secondary | ICD-10-CM | POA: Diagnosis not present

## 2023-04-30 DIAGNOSIS — T7840XD Allergy, unspecified, subsequent encounter: Secondary | ICD-10-CM | POA: Diagnosis not present

## 2023-04-30 DIAGNOSIS — E876 Hypokalemia: Secondary | ICD-10-CM | POA: Diagnosis not present

## 2023-04-30 DIAGNOSIS — N2581 Secondary hyperparathyroidism of renal origin: Secondary | ICD-10-CM | POA: Diagnosis not present

## 2023-04-30 DIAGNOSIS — Z992 Dependence on renal dialysis: Secondary | ICD-10-CM | POA: Diagnosis not present

## 2023-04-30 DIAGNOSIS — D631 Anemia in chronic kidney disease: Secondary | ICD-10-CM | POA: Diagnosis not present

## 2023-04-30 DIAGNOSIS — N186 End stage renal disease: Secondary | ICD-10-CM | POA: Diagnosis not present

## 2023-04-30 DIAGNOSIS — Z23 Encounter for immunization: Secondary | ICD-10-CM | POA: Diagnosis not present

## 2023-04-30 DIAGNOSIS — D689 Coagulation defect, unspecified: Secondary | ICD-10-CM | POA: Diagnosis not present

## 2023-05-02 DIAGNOSIS — E876 Hypokalemia: Secondary | ICD-10-CM | POA: Diagnosis not present

## 2023-05-02 DIAGNOSIS — Z992 Dependence on renal dialysis: Secondary | ICD-10-CM | POA: Diagnosis not present

## 2023-05-02 DIAGNOSIS — Z23 Encounter for immunization: Secondary | ICD-10-CM | POA: Diagnosis not present

## 2023-05-02 DIAGNOSIS — D689 Coagulation defect, unspecified: Secondary | ICD-10-CM | POA: Diagnosis not present

## 2023-05-02 DIAGNOSIS — T7840XD Allergy, unspecified, subsequent encounter: Secondary | ICD-10-CM | POA: Diagnosis not present

## 2023-05-02 DIAGNOSIS — D631 Anemia in chronic kidney disease: Secondary | ICD-10-CM | POA: Diagnosis not present

## 2023-05-02 DIAGNOSIS — N186 End stage renal disease: Secondary | ICD-10-CM | POA: Diagnosis not present

## 2023-05-02 DIAGNOSIS — N2581 Secondary hyperparathyroidism of renal origin: Secondary | ICD-10-CM | POA: Diagnosis not present

## 2023-05-04 DIAGNOSIS — D631 Anemia in chronic kidney disease: Secondary | ICD-10-CM | POA: Diagnosis not present

## 2023-05-04 DIAGNOSIS — N2581 Secondary hyperparathyroidism of renal origin: Secondary | ICD-10-CM | POA: Diagnosis not present

## 2023-05-04 DIAGNOSIS — N186 End stage renal disease: Secondary | ICD-10-CM | POA: Diagnosis not present

## 2023-05-04 DIAGNOSIS — Z992 Dependence on renal dialysis: Secondary | ICD-10-CM | POA: Diagnosis not present

## 2023-05-04 DIAGNOSIS — T7840XD Allergy, unspecified, subsequent encounter: Secondary | ICD-10-CM | POA: Diagnosis not present

## 2023-05-04 DIAGNOSIS — D689 Coagulation defect, unspecified: Secondary | ICD-10-CM | POA: Diagnosis not present

## 2023-05-04 DIAGNOSIS — Z23 Encounter for immunization: Secondary | ICD-10-CM | POA: Diagnosis not present

## 2023-05-04 DIAGNOSIS — E876 Hypokalemia: Secondary | ICD-10-CM | POA: Diagnosis not present

## 2023-05-07 DIAGNOSIS — N186 End stage renal disease: Secondary | ICD-10-CM | POA: Diagnosis not present

## 2023-05-07 DIAGNOSIS — Z992 Dependence on renal dialysis: Secondary | ICD-10-CM | POA: Diagnosis not present

## 2023-05-07 DIAGNOSIS — E876 Hypokalemia: Secondary | ICD-10-CM | POA: Diagnosis not present

## 2023-05-07 DIAGNOSIS — Z23 Encounter for immunization: Secondary | ICD-10-CM | POA: Diagnosis not present

## 2023-05-07 DIAGNOSIS — D631 Anemia in chronic kidney disease: Secondary | ICD-10-CM | POA: Diagnosis not present

## 2023-05-07 DIAGNOSIS — T7840XD Allergy, unspecified, subsequent encounter: Secondary | ICD-10-CM | POA: Diagnosis not present

## 2023-05-07 DIAGNOSIS — D689 Coagulation defect, unspecified: Secondary | ICD-10-CM | POA: Diagnosis not present

## 2023-05-07 DIAGNOSIS — N2581 Secondary hyperparathyroidism of renal origin: Secondary | ICD-10-CM | POA: Diagnosis not present

## 2023-05-09 DIAGNOSIS — N186 End stage renal disease: Secondary | ICD-10-CM | POA: Diagnosis not present

## 2023-05-09 DIAGNOSIS — Z992 Dependence on renal dialysis: Secondary | ICD-10-CM | POA: Diagnosis not present

## 2023-05-09 DIAGNOSIS — T7840XD Allergy, unspecified, subsequent encounter: Secondary | ICD-10-CM | POA: Diagnosis not present

## 2023-05-09 DIAGNOSIS — N2581 Secondary hyperparathyroidism of renal origin: Secondary | ICD-10-CM | POA: Diagnosis not present

## 2023-05-09 DIAGNOSIS — Z23 Encounter for immunization: Secondary | ICD-10-CM | POA: Diagnosis not present

## 2023-05-09 DIAGNOSIS — D631 Anemia in chronic kidney disease: Secondary | ICD-10-CM | POA: Diagnosis not present

## 2023-05-09 DIAGNOSIS — D689 Coagulation defect, unspecified: Secondary | ICD-10-CM | POA: Diagnosis not present

## 2023-05-09 DIAGNOSIS — E876 Hypokalemia: Secondary | ICD-10-CM | POA: Diagnosis not present

## 2023-05-10 ENCOUNTER — Ambulatory Visit: Payer: Medicare Other | Admitting: Neurology

## 2023-05-10 DIAGNOSIS — R202 Paresthesia of skin: Secondary | ICD-10-CM | POA: Diagnosis not present

## 2023-05-10 DIAGNOSIS — M5412 Radiculopathy, cervical region: Secondary | ICD-10-CM

## 2023-05-10 NOTE — Procedures (Signed)
  [facility]  [address], [address]  [place], [place] [address] Tel: [phone] Fax: [phone] Test Date:  05/10/2023  Patient: Jackie Wade DOB: [birth year] Physician: Nita Sickle, DO  Sex: Female Height: 5\' 3"  Ref Phys: Nita Sickle, DO  ID#: [id number]   Technician:    History: 77 year old female referred for evaluation of right arm paresthesias.  NCV & EMG Findings: Extensive electrodiagnostic testing of the right upper extremity shows:  Right median sensory response is absent.  Right ulnar and radial sensory responses are within normal limits. Right median motor response shows severely prolonged latency (9.5 ms) and reduced amplitude (4.8 mV).  Of note, there is evidence of a right Martin-Gruber anastomosis, a normal anatomic variant.  Right ulnar motor responses within normal limits. Chronic motor axon loss changes are seen affecting the right abductor pollicis brevis, biceps, and deltoid muscles.  There is no evidence of accompanying active denervation.  Impression: Right median neuropathy at or distal to the wrist, consistent with a clinical diagnosis of carpal tunnel syndrome.  Overall, these findings are severe in degree electrically. Chronic C5-C6 radiculopathy affecting the right upper extremity, mild.   ___________________________ Nita Sickle, DO    Nerve Conduction Studies   Stim Site NR Peak (ms) Norm Peak (ms) O-P Amp (V) Norm O-P Amp  Right Median Anti Sensory (2nd Digit)  32 C  Wrist *NR  <3.8  >10  Right Radial Anti Sensory (Base 1st Digit)  32 C  Wrist    2.3 <2.8 25.8 >10  Right Ulnar Anti Sensory (5th Digit)  32 C  Wrist    2.8 <3.2 16.8 >5     Stim Site NR Onset (ms) Norm Onset (ms) O-P Amp (mV) Norm O-P Amp Site1 Site2 Delta-0 (ms) Dist (cm) Vel (m/s) Norm Vel (m/s)  Right Median Motor (Abd Poll Brev)  32 C  Wrist    *9.5 <4.0 *4.8 >5 Elbow Wrist 3.7 27.0 73 >50  Elbow    13.2  4.9  Ulnar-wrist crossover Elbow 9.1 0.0     Ulnar-wrist crossover    4.1  1.7         Right Ulnar Motor (Abd Dig Minimi)  32 C  Wrist    2.7 <3.1 8.0 >7 B Elbow Wrist 4.3 22.0 51 >50  B Elbow    7.0  6.9  A Elbow B Elbow 1.7 10.0 59 >50  A Elbow    8.7  6.3          Electromyography   Side Muscle Ins.Act Fibs Fasc Recrt Amp Dur Poly Activation Comment  Right 1stDorInt Nml Nml Nml Nml Nml Nml Nml Nml N/A  Right Abd Poll Brev Nml Nml Nml *2- *1+ *1+ *1+ Nml N/A  Right PronatorTeres Nml Nml Nml Nml Nml Nml Nml Nml N/A  Right Biceps Nml Nml Nml *1- *1+ *1+ *1+ Nml N/A  Right Triceps Nml Nml Nml Nml Nml Nml Nml Nml N/A  Right Deltoid Nml Nml Nml *1- *1+ *1+ *1+ Nml N/A      Waveforms:

## 2023-05-11 ENCOUNTER — Telehealth: Payer: Self-pay

## 2023-05-11 DIAGNOSIS — N2581 Secondary hyperparathyroidism of renal origin: Secondary | ICD-10-CM | POA: Diagnosis not present

## 2023-05-11 DIAGNOSIS — Z23 Encounter for immunization: Secondary | ICD-10-CM | POA: Diagnosis not present

## 2023-05-11 DIAGNOSIS — T7840XD Allergy, unspecified, subsequent encounter: Secondary | ICD-10-CM | POA: Diagnosis not present

## 2023-05-11 DIAGNOSIS — D689 Coagulation defect, unspecified: Secondary | ICD-10-CM | POA: Diagnosis not present

## 2023-05-11 DIAGNOSIS — E876 Hypokalemia: Secondary | ICD-10-CM | POA: Diagnosis not present

## 2023-05-11 DIAGNOSIS — D631 Anemia in chronic kidney disease: Secondary | ICD-10-CM | POA: Diagnosis not present

## 2023-05-11 DIAGNOSIS — N186 End stage renal disease: Secondary | ICD-10-CM | POA: Diagnosis not present

## 2023-05-11 DIAGNOSIS — Z992 Dependence on renal dialysis: Secondary | ICD-10-CM | POA: Diagnosis not present

## 2023-05-11 NOTE — Telephone Encounter (Signed)
Called patient and provided her with her EMG/NCV results. Once results were provided to patient she asked me "Can I get my kidney transplant?" I informed patient that I was just calling to provide her with the results and recommendations and I am not sure about the Kidney transplant but I would relay her message to Dr. Allena Katz.   Patient began raising her voice on the phone saying that this is the whole reason why she had the EMG done to become active on the list for a kidney transplant. I asked patient to stop yelling on the phone because I had to pull the phone from my ear she was so loud. I again explained to patient that this was only a call to let her know the results of her EMG. Patient requested that Dr. Allena Katz call her personally several times so that she can talk to her about her Kidney Transplant. I informed patient that Doctors don't necessarily call patients and this is why they have [place] to relay messages. I again offered to relay her message to Dr. Allena Katz and I would let her know what Dr. Allena Katz what Dr. Allena Katz recommends and says. Patient was upset and requested our phone number to call to make an appointment with Dr. Allena Katz and asked for my name. I provided her both the phone number and my name.

## 2023-05-14 DIAGNOSIS — Z23 Encounter for immunization: Secondary | ICD-10-CM | POA: Diagnosis not present

## 2023-05-14 DIAGNOSIS — Z992 Dependence on renal dialysis: Secondary | ICD-10-CM | POA: Diagnosis not present

## 2023-05-14 DIAGNOSIS — D631 Anemia in chronic kidney disease: Secondary | ICD-10-CM | POA: Diagnosis not present

## 2023-05-14 DIAGNOSIS — N2581 Secondary hyperparathyroidism of renal origin: Secondary | ICD-10-CM | POA: Diagnosis not present

## 2023-05-14 DIAGNOSIS — N186 End stage renal disease: Secondary | ICD-10-CM | POA: Diagnosis not present

## 2023-05-14 DIAGNOSIS — T7840XD Allergy, unspecified, subsequent encounter: Secondary | ICD-10-CM | POA: Diagnosis not present

## 2023-05-14 DIAGNOSIS — D689 Coagulation defect, unspecified: Secondary | ICD-10-CM | POA: Diagnosis not present

## 2023-05-14 DIAGNOSIS — E876 Hypokalemia: Secondary | ICD-10-CM | POA: Diagnosis not present

## 2023-05-14 NOTE — Telephone Encounter (Signed)
She would need to talk to her transplant team regarding eligibility for kidney transplant.    We can fax the EMG results to her team at [facility], if needed.  She may schedule a follow-up visit to discuss the findings of nerve testing or if she has additional questions.

## 2023-05-15 NOTE — Telephone Encounter (Signed)
Called patient and again she was extremely rude. I tried to inform her of Dr. Eliane Decree response and she cut me off and said she already took the results to her kidney doctor. Patient wanted to know if the results will hinder her from getting a transplant? I informed patient that this is up to the kidney doctor and his review of the EMG done by Dr. Allena Katz and patient responded with "this is what you told me before!". Patient also stated "As a Neurologist Dr. Allena Katz can't tell me anything??" Patient got frustrated with me and asked to speak to Dr. Allena Katz directly. I told patient that per Dr. Allena Katz if you have any questions you can schedule a follow up with her. Patient stated "so this is what ya'll do? You charge them for an appointment and I can't talk to her?" I informed patient this is office policy and we cannot get physicians on the phone for patients and that is why a follow up appointment is recommend.   Patient said to me "you need to work on how you talk to people" and hung up on me.

## 2023-05-16 DIAGNOSIS — D689 Coagulation defect, unspecified: Secondary | ICD-10-CM | POA: Diagnosis not present

## 2023-05-16 DIAGNOSIS — Z23 Encounter for immunization: Secondary | ICD-10-CM | POA: Diagnosis not present

## 2023-05-16 DIAGNOSIS — T7840XD Allergy, unspecified, subsequent encounter: Secondary | ICD-10-CM | POA: Diagnosis not present

## 2023-05-16 DIAGNOSIS — N186 End stage renal disease: Secondary | ICD-10-CM | POA: Diagnosis not present

## 2023-05-16 DIAGNOSIS — N2581 Secondary hyperparathyroidism of renal origin: Secondary | ICD-10-CM | POA: Diagnosis not present

## 2023-05-16 DIAGNOSIS — Z992 Dependence on renal dialysis: Secondary | ICD-10-CM | POA: Diagnosis not present

## 2023-05-16 DIAGNOSIS — D631 Anemia in chronic kidney disease: Secondary | ICD-10-CM | POA: Diagnosis not present

## 2023-05-16 DIAGNOSIS — E876 Hypokalemia: Secondary | ICD-10-CM | POA: Diagnosis not present

## 2023-05-18 ENCOUNTER — Ambulatory Visit
Admission: RE | Admit: 2023-05-18 | Discharge: 2023-05-18 | Disposition: A | Discharge status: Home / Self Care | Payer: Medicare Other | Source: Ambulatory Visit | Attending: Family Medicine | Admitting: Family Medicine

## 2023-05-18 DIAGNOSIS — N2581 Secondary hyperparathyroidism of renal origin: Secondary | ICD-10-CM | POA: Diagnosis not present

## 2023-05-18 DIAGNOSIS — T7840XD Allergy, unspecified, subsequent encounter: Secondary | ICD-10-CM | POA: Diagnosis not present

## 2023-05-18 DIAGNOSIS — R202 Paresthesia of skin: Secondary | ICD-10-CM | POA: Diagnosis not present

## 2023-05-18 DIAGNOSIS — Z23 Encounter for immunization: Secondary | ICD-10-CM | POA: Diagnosis not present

## 2023-05-18 DIAGNOSIS — I708 Atherosclerosis of other arteries: Secondary | ICD-10-CM | POA: Diagnosis not present

## 2023-05-18 DIAGNOSIS — Z992 Dependence on renal dialysis: Secondary | ICD-10-CM | POA: Diagnosis not present

## 2023-05-18 DIAGNOSIS — N186 End stage renal disease: Secondary | ICD-10-CM | POA: Diagnosis not present

## 2023-05-18 DIAGNOSIS — E876 Hypokalemia: Secondary | ICD-10-CM | POA: Diagnosis not present

## 2023-05-18 DIAGNOSIS — D631 Anemia in chronic kidney disease: Secondary | ICD-10-CM | POA: Diagnosis not present

## 2023-05-18 DIAGNOSIS — D689 Coagulation defect, unspecified: Secondary | ICD-10-CM | POA: Diagnosis not present

## 2023-05-21 DIAGNOSIS — Z23 Encounter for immunization: Secondary | ICD-10-CM | POA: Diagnosis not present

## 2023-05-21 DIAGNOSIS — N186 End stage renal disease: Secondary | ICD-10-CM | POA: Diagnosis not present

## 2023-05-21 DIAGNOSIS — D631 Anemia in chronic kidney disease: Secondary | ICD-10-CM | POA: Diagnosis not present

## 2023-05-21 DIAGNOSIS — Z992 Dependence on renal dialysis: Secondary | ICD-10-CM | POA: Diagnosis not present

## 2023-05-21 DIAGNOSIS — T7840XD Allergy, unspecified, subsequent encounter: Secondary | ICD-10-CM | POA: Diagnosis not present

## 2023-05-21 DIAGNOSIS — E876 Hypokalemia: Secondary | ICD-10-CM | POA: Diagnosis not present

## 2023-05-21 DIAGNOSIS — N2581 Secondary hyperparathyroidism of renal origin: Secondary | ICD-10-CM | POA: Diagnosis not present

## 2023-05-21 DIAGNOSIS — D689 Coagulation defect, unspecified: Secondary | ICD-10-CM | POA: Diagnosis not present

## 2023-05-23 DIAGNOSIS — E876 Hypokalemia: Secondary | ICD-10-CM | POA: Diagnosis not present

## 2023-05-23 DIAGNOSIS — N2581 Secondary hyperparathyroidism of renal origin: Secondary | ICD-10-CM | POA: Diagnosis not present

## 2023-05-23 DIAGNOSIS — Z992 Dependence on renal dialysis: Secondary | ICD-10-CM | POA: Diagnosis not present

## 2023-05-23 DIAGNOSIS — Z23 Encounter for immunization: Secondary | ICD-10-CM | POA: Diagnosis not present

## 2023-05-23 DIAGNOSIS — D631 Anemia in chronic kidney disease: Secondary | ICD-10-CM | POA: Diagnosis not present

## 2023-05-23 DIAGNOSIS — D689 Coagulation defect, unspecified: Secondary | ICD-10-CM | POA: Diagnosis not present

## 2023-05-23 DIAGNOSIS — T7840XD Allergy, unspecified, subsequent encounter: Secondary | ICD-10-CM | POA: Diagnosis not present

## 2023-05-23 DIAGNOSIS — N186 End stage renal disease: Secondary | ICD-10-CM | POA: Diagnosis not present

## 2023-05-25 DIAGNOSIS — D631 Anemia in chronic kidney disease: Secondary | ICD-10-CM | POA: Diagnosis not present

## 2023-05-25 DIAGNOSIS — E876 Hypokalemia: Secondary | ICD-10-CM | POA: Diagnosis not present

## 2023-05-25 DIAGNOSIS — N2581 Secondary hyperparathyroidism of renal origin: Secondary | ICD-10-CM | POA: Diagnosis not present

## 2023-05-25 DIAGNOSIS — N186 End stage renal disease: Secondary | ICD-10-CM | POA: Diagnosis not present

## 2023-05-25 DIAGNOSIS — Z23 Encounter for immunization: Secondary | ICD-10-CM | POA: Diagnosis not present

## 2023-05-25 DIAGNOSIS — T7840XD Allergy, unspecified, subsequent encounter: Secondary | ICD-10-CM | POA: Diagnosis not present

## 2023-05-25 DIAGNOSIS — Z992 Dependence on renal dialysis: Secondary | ICD-10-CM | POA: Diagnosis not present

## 2023-05-25 DIAGNOSIS — D689 Coagulation defect, unspecified: Secondary | ICD-10-CM | POA: Diagnosis not present

## 2023-05-27 DIAGNOSIS — N186 End stage renal disease: Secondary | ICD-10-CM | POA: Diagnosis not present

## 2023-05-27 DIAGNOSIS — Z992 Dependence on renal dialysis: Secondary | ICD-10-CM | POA: Diagnosis not present

## 2023-05-27 DIAGNOSIS — E1122 Type 2 diabetes mellitus with diabetic chronic kidney disease: Secondary | ICD-10-CM | POA: Diagnosis not present

## 2023-05-28 DIAGNOSIS — E876 Hypokalemia: Secondary | ICD-10-CM | POA: Diagnosis not present

## 2023-05-28 DIAGNOSIS — N186 End stage renal disease: Secondary | ICD-10-CM | POA: Diagnosis not present

## 2023-05-28 DIAGNOSIS — Z992 Dependence on renal dialysis: Secondary | ICD-10-CM | POA: Diagnosis not present

## 2023-05-28 DIAGNOSIS — N2581 Secondary hyperparathyroidism of renal origin: Secondary | ICD-10-CM | POA: Diagnosis not present

## 2023-05-28 DIAGNOSIS — D631 Anemia in chronic kidney disease: Secondary | ICD-10-CM | POA: Diagnosis not present

## 2023-05-28 DIAGNOSIS — D689 Coagulation defect, unspecified: Secondary | ICD-10-CM | POA: Diagnosis not present

## 2023-05-28 DIAGNOSIS — T7840XD Allergy, unspecified, subsequent encounter: Secondary | ICD-10-CM | POA: Diagnosis not present

## 2023-05-30 DIAGNOSIS — N2581 Secondary hyperparathyroidism of renal origin: Secondary | ICD-10-CM | POA: Diagnosis not present

## 2023-05-30 DIAGNOSIS — Z992 Dependence on renal dialysis: Secondary | ICD-10-CM | POA: Diagnosis not present

## 2023-05-30 DIAGNOSIS — D689 Coagulation defect, unspecified: Secondary | ICD-10-CM | POA: Diagnosis not present

## 2023-05-30 DIAGNOSIS — E876 Hypokalemia: Secondary | ICD-10-CM | POA: Diagnosis not present

## 2023-05-30 DIAGNOSIS — T7840XD Allergy, unspecified, subsequent encounter: Secondary | ICD-10-CM | POA: Diagnosis not present

## 2023-05-30 DIAGNOSIS — N186 End stage renal disease: Secondary | ICD-10-CM | POA: Diagnosis not present

## 2023-05-30 DIAGNOSIS — D631 Anemia in chronic kidney disease: Secondary | ICD-10-CM | POA: Diagnosis not present

## 2023-06-01 DIAGNOSIS — E876 Hypokalemia: Secondary | ICD-10-CM | POA: Diagnosis not present

## 2023-06-01 DIAGNOSIS — N2581 Secondary hyperparathyroidism of renal origin: Secondary | ICD-10-CM | POA: Diagnosis not present

## 2023-06-01 DIAGNOSIS — N186 End stage renal disease: Secondary | ICD-10-CM | POA: Diagnosis not present

## 2023-06-01 DIAGNOSIS — T7840XD Allergy, unspecified, subsequent encounter: Secondary | ICD-10-CM | POA: Diagnosis not present

## 2023-06-01 DIAGNOSIS — Z992 Dependence on renal dialysis: Secondary | ICD-10-CM | POA: Diagnosis not present

## 2023-06-01 DIAGNOSIS — D689 Coagulation defect, unspecified: Secondary | ICD-10-CM | POA: Diagnosis not present

## 2023-06-01 DIAGNOSIS — D631 Anemia in chronic kidney disease: Secondary | ICD-10-CM | POA: Diagnosis not present

## 2023-06-04 DIAGNOSIS — E876 Hypokalemia: Secondary | ICD-10-CM | POA: Diagnosis not present

## 2023-06-04 DIAGNOSIS — D689 Coagulation defect, unspecified: Secondary | ICD-10-CM | POA: Diagnosis not present

## 2023-06-04 DIAGNOSIS — N186 End stage renal disease: Secondary | ICD-10-CM | POA: Diagnosis not present

## 2023-06-04 DIAGNOSIS — D631 Anemia in chronic kidney disease: Secondary | ICD-10-CM | POA: Diagnosis not present

## 2023-06-04 DIAGNOSIS — T7840XD Allergy, unspecified, subsequent encounter: Secondary | ICD-10-CM | POA: Diagnosis not present

## 2023-06-04 DIAGNOSIS — Z992 Dependence on renal dialysis: Secondary | ICD-10-CM | POA: Diagnosis not present

## 2023-06-04 DIAGNOSIS — N2581 Secondary hyperparathyroidism of renal origin: Secondary | ICD-10-CM | POA: Diagnosis not present

## 2023-06-06 DIAGNOSIS — D689 Coagulation defect, unspecified: Secondary | ICD-10-CM | POA: Diagnosis not present

## 2023-06-06 DIAGNOSIS — E876 Hypokalemia: Secondary | ICD-10-CM | POA: Diagnosis not present

## 2023-06-06 DIAGNOSIS — T7840XD Allergy, unspecified, subsequent encounter: Secondary | ICD-10-CM | POA: Diagnosis not present

## 2023-06-06 DIAGNOSIS — Z992 Dependence on renal dialysis: Secondary | ICD-10-CM | POA: Diagnosis not present

## 2023-06-06 DIAGNOSIS — N186 End stage renal disease: Secondary | ICD-10-CM | POA: Diagnosis not present

## 2023-06-06 DIAGNOSIS — N2581 Secondary hyperparathyroidism of renal origin: Secondary | ICD-10-CM | POA: Diagnosis not present

## 2023-06-06 DIAGNOSIS — D631 Anemia in chronic kidney disease: Secondary | ICD-10-CM | POA: Diagnosis not present

## 2023-06-08 DIAGNOSIS — T7840XD Allergy, unspecified, subsequent encounter: Secondary | ICD-10-CM | POA: Diagnosis not present

## 2023-06-08 DIAGNOSIS — N186 End stage renal disease: Secondary | ICD-10-CM | POA: Diagnosis not present

## 2023-06-08 DIAGNOSIS — N2581 Secondary hyperparathyroidism of renal origin: Secondary | ICD-10-CM | POA: Diagnosis not present

## 2023-06-08 DIAGNOSIS — Z992 Dependence on renal dialysis: Secondary | ICD-10-CM | POA: Diagnosis not present

## 2023-06-08 DIAGNOSIS — D689 Coagulation defect, unspecified: Secondary | ICD-10-CM | POA: Diagnosis not present

## 2023-06-08 DIAGNOSIS — D631 Anemia in chronic kidney disease: Secondary | ICD-10-CM | POA: Diagnosis not present

## 2023-06-08 DIAGNOSIS — E876 Hypokalemia: Secondary | ICD-10-CM | POA: Diagnosis not present

## 2023-06-11 DIAGNOSIS — D631 Anemia in chronic kidney disease: Secondary | ICD-10-CM | POA: Diagnosis not present

## 2023-06-11 DIAGNOSIS — T7840XD Allergy, unspecified, subsequent encounter: Secondary | ICD-10-CM | POA: Diagnosis not present

## 2023-06-11 DIAGNOSIS — Z992 Dependence on renal dialysis: Secondary | ICD-10-CM | POA: Diagnosis not present

## 2023-06-11 DIAGNOSIS — D689 Coagulation defect, unspecified: Secondary | ICD-10-CM | POA: Diagnosis not present

## 2023-06-11 DIAGNOSIS — N186 End stage renal disease: Secondary | ICD-10-CM | POA: Diagnosis not present

## 2023-06-11 DIAGNOSIS — N2581 Secondary hyperparathyroidism of renal origin: Secondary | ICD-10-CM | POA: Diagnosis not present

## 2023-06-11 DIAGNOSIS — E876 Hypokalemia: Secondary | ICD-10-CM | POA: Diagnosis not present

## 2023-06-13 DIAGNOSIS — E876 Hypokalemia: Secondary | ICD-10-CM | POA: Diagnosis not present

## 2023-06-13 DIAGNOSIS — N186 End stage renal disease: Secondary | ICD-10-CM | POA: Diagnosis not present

## 2023-06-13 DIAGNOSIS — Z992 Dependence on renal dialysis: Secondary | ICD-10-CM | POA: Diagnosis not present

## 2023-06-13 DIAGNOSIS — D631 Anemia in chronic kidney disease: Secondary | ICD-10-CM | POA: Diagnosis not present

## 2023-06-13 DIAGNOSIS — T7840XD Allergy, unspecified, subsequent encounter: Secondary | ICD-10-CM | POA: Diagnosis not present

## 2023-06-13 DIAGNOSIS — D689 Coagulation defect, unspecified: Secondary | ICD-10-CM | POA: Diagnosis not present

## 2023-06-13 DIAGNOSIS — N2581 Secondary hyperparathyroidism of renal origin: Secondary | ICD-10-CM | POA: Diagnosis not present

## 2023-06-15 DIAGNOSIS — Z992 Dependence on renal dialysis: Secondary | ICD-10-CM | POA: Diagnosis not present

## 2023-06-15 DIAGNOSIS — D631 Anemia in chronic kidney disease: Secondary | ICD-10-CM | POA: Diagnosis not present

## 2023-06-15 DIAGNOSIS — N186 End stage renal disease: Secondary | ICD-10-CM | POA: Diagnosis not present

## 2023-06-15 DIAGNOSIS — N2581 Secondary hyperparathyroidism of renal origin: Secondary | ICD-10-CM | POA: Diagnosis not present

## 2023-06-15 DIAGNOSIS — D689 Coagulation defect, unspecified: Secondary | ICD-10-CM | POA: Diagnosis not present

## 2023-06-15 DIAGNOSIS — T7840XD Allergy, unspecified, subsequent encounter: Secondary | ICD-10-CM | POA: Diagnosis not present

## 2023-06-15 DIAGNOSIS — E876 Hypokalemia: Secondary | ICD-10-CM | POA: Diagnosis not present

## 2023-06-18 DIAGNOSIS — N186 End stage renal disease: Secondary | ICD-10-CM | POA: Diagnosis not present

## 2023-06-18 DIAGNOSIS — N2581 Secondary hyperparathyroidism of renal origin: Secondary | ICD-10-CM | POA: Diagnosis not present

## 2023-06-18 DIAGNOSIS — Z992 Dependence on renal dialysis: Secondary | ICD-10-CM | POA: Diagnosis not present

## 2023-06-18 DIAGNOSIS — T7840XD Allergy, unspecified, subsequent encounter: Secondary | ICD-10-CM | POA: Diagnosis not present

## 2023-06-18 DIAGNOSIS — D631 Anemia in chronic kidney disease: Secondary | ICD-10-CM | POA: Diagnosis not present

## 2023-06-18 DIAGNOSIS — D689 Coagulation defect, unspecified: Secondary | ICD-10-CM | POA: Diagnosis not present

## 2023-06-18 DIAGNOSIS — E876 Hypokalemia: Secondary | ICD-10-CM | POA: Diagnosis not present

## 2023-06-20 DIAGNOSIS — D631 Anemia in chronic kidney disease: Secondary | ICD-10-CM | POA: Diagnosis not present

## 2023-06-20 DIAGNOSIS — Z992 Dependence on renal dialysis: Secondary | ICD-10-CM | POA: Diagnosis not present

## 2023-06-20 DIAGNOSIS — N186 End stage renal disease: Secondary | ICD-10-CM | POA: Diagnosis not present

## 2023-06-20 DIAGNOSIS — E876 Hypokalemia: Secondary | ICD-10-CM | POA: Diagnosis not present

## 2023-06-20 DIAGNOSIS — D689 Coagulation defect, unspecified: Secondary | ICD-10-CM | POA: Diagnosis not present

## 2023-06-20 DIAGNOSIS — T7840XD Allergy, unspecified, subsequent encounter: Secondary | ICD-10-CM | POA: Diagnosis not present

## 2023-06-20 DIAGNOSIS — N2581 Secondary hyperparathyroidism of renal origin: Secondary | ICD-10-CM | POA: Diagnosis not present

## 2023-06-22 DIAGNOSIS — E876 Hypokalemia: Secondary | ICD-10-CM | POA: Diagnosis not present

## 2023-06-22 DIAGNOSIS — D689 Coagulation defect, unspecified: Secondary | ICD-10-CM | POA: Diagnosis not present

## 2023-06-22 DIAGNOSIS — T7840XD Allergy, unspecified, subsequent encounter: Secondary | ICD-10-CM | POA: Diagnosis not present

## 2023-06-22 DIAGNOSIS — Z992 Dependence on renal dialysis: Secondary | ICD-10-CM | POA: Diagnosis not present

## 2023-06-22 DIAGNOSIS — N186 End stage renal disease: Secondary | ICD-10-CM | POA: Diagnosis not present

## 2023-06-22 DIAGNOSIS — N2581 Secondary hyperparathyroidism of renal origin: Secondary | ICD-10-CM | POA: Diagnosis not present

## 2023-06-22 DIAGNOSIS — D631 Anemia in chronic kidney disease: Secondary | ICD-10-CM | POA: Diagnosis not present

## 2023-06-26 DIAGNOSIS — Z992 Dependence on renal dialysis: Secondary | ICD-10-CM | POA: Diagnosis not present

## 2023-06-26 DIAGNOSIS — N2581 Secondary hyperparathyroidism of renal origin: Secondary | ICD-10-CM | POA: Diagnosis not present

## 2023-06-26 DIAGNOSIS — N186 End stage renal disease: Secondary | ICD-10-CM | POA: Diagnosis not present

## 2023-06-26 DIAGNOSIS — D689 Coagulation defect, unspecified: Secondary | ICD-10-CM | POA: Diagnosis not present

## 2023-06-27 DIAGNOSIS — E1122 Type 2 diabetes mellitus with diabetic chronic kidney disease: Secondary | ICD-10-CM | POA: Diagnosis not present

## 2023-06-27 DIAGNOSIS — Z992 Dependence on renal dialysis: Secondary | ICD-10-CM | POA: Diagnosis not present

## 2023-06-27 DIAGNOSIS — N186 End stage renal disease: Secondary | ICD-10-CM | POA: Diagnosis not present

## 2023-06-28 DIAGNOSIS — Z992 Dependence on renal dialysis: Secondary | ICD-10-CM | POA: Diagnosis not present

## 2023-06-28 DIAGNOSIS — D689 Coagulation defect, unspecified: Secondary | ICD-10-CM | POA: Diagnosis not present

## 2023-06-28 DIAGNOSIS — N186 End stage renal disease: Secondary | ICD-10-CM | POA: Diagnosis not present

## 2023-06-28 DIAGNOSIS — N2581 Secondary hyperparathyroidism of renal origin: Secondary | ICD-10-CM | POA: Diagnosis not present

## 2023-06-30 DIAGNOSIS — N186 End stage renal disease: Secondary | ICD-10-CM | POA: Diagnosis not present

## 2023-06-30 DIAGNOSIS — D689 Coagulation defect, unspecified: Secondary | ICD-10-CM | POA: Diagnosis not present

## 2023-06-30 DIAGNOSIS — N2581 Secondary hyperparathyroidism of renal origin: Secondary | ICD-10-CM | POA: Diagnosis not present

## 2023-06-30 DIAGNOSIS — Z992 Dependence on renal dialysis: Secondary | ICD-10-CM | POA: Diagnosis not present

## 2023-06-30 DIAGNOSIS — E876 Hypokalemia: Secondary | ICD-10-CM | POA: Diagnosis not present

## 2023-06-30 DIAGNOSIS — T7840XD Allergy, unspecified, subsequent encounter: Secondary | ICD-10-CM | POA: Diagnosis not present

## 2023-06-30 DIAGNOSIS — D631 Anemia in chronic kidney disease: Secondary | ICD-10-CM | POA: Diagnosis not present

## 2023-06-30 DIAGNOSIS — D509 Iron deficiency anemia, unspecified: Secondary | ICD-10-CM | POA: Diagnosis not present

## 2023-07-02 DIAGNOSIS — Z992 Dependence on renal dialysis: Secondary | ICD-10-CM | POA: Diagnosis not present

## 2023-07-02 DIAGNOSIS — E876 Hypokalemia: Secondary | ICD-10-CM | POA: Diagnosis not present

## 2023-07-02 DIAGNOSIS — N2581 Secondary hyperparathyroidism of renal origin: Secondary | ICD-10-CM | POA: Diagnosis not present

## 2023-07-02 DIAGNOSIS — T7840XD Allergy, unspecified, subsequent encounter: Secondary | ICD-10-CM | POA: Diagnosis not present

## 2023-07-02 DIAGNOSIS — D509 Iron deficiency anemia, unspecified: Secondary | ICD-10-CM | POA: Diagnosis not present

## 2023-07-02 DIAGNOSIS — N186 End stage renal disease: Secondary | ICD-10-CM | POA: Diagnosis not present

## 2023-07-02 DIAGNOSIS — D689 Coagulation defect, unspecified: Secondary | ICD-10-CM | POA: Diagnosis not present

## 2023-07-02 DIAGNOSIS — D631 Anemia in chronic kidney disease: Secondary | ICD-10-CM | POA: Diagnosis not present

## 2023-07-04 DIAGNOSIS — N186 End stage renal disease: Secondary | ICD-10-CM | POA: Diagnosis not present

## 2023-07-04 DIAGNOSIS — T7840XD Allergy, unspecified, subsequent encounter: Secondary | ICD-10-CM | POA: Diagnosis not present

## 2023-07-04 DIAGNOSIS — D689 Coagulation defect, unspecified: Secondary | ICD-10-CM | POA: Diagnosis not present

## 2023-07-04 DIAGNOSIS — D631 Anemia in chronic kidney disease: Secondary | ICD-10-CM | POA: Diagnosis not present

## 2023-07-04 DIAGNOSIS — N2581 Secondary hyperparathyroidism of renal origin: Secondary | ICD-10-CM | POA: Diagnosis not present

## 2023-07-04 DIAGNOSIS — E876 Hypokalemia: Secondary | ICD-10-CM | POA: Diagnosis not present

## 2023-07-04 DIAGNOSIS — Z992 Dependence on renal dialysis: Secondary | ICD-10-CM | POA: Diagnosis not present

## 2023-07-04 DIAGNOSIS — D509 Iron deficiency anemia, unspecified: Secondary | ICD-10-CM | POA: Diagnosis not present

## 2023-07-06 DIAGNOSIS — D689 Coagulation defect, unspecified: Secondary | ICD-10-CM | POA: Diagnosis not present

## 2023-07-06 DIAGNOSIS — Z992 Dependence on renal dialysis: Secondary | ICD-10-CM | POA: Diagnosis not present

## 2023-07-06 DIAGNOSIS — D631 Anemia in chronic kidney disease: Secondary | ICD-10-CM | POA: Diagnosis not present

## 2023-07-06 DIAGNOSIS — N186 End stage renal disease: Secondary | ICD-10-CM | POA: Diagnosis not present

## 2023-07-06 DIAGNOSIS — N2581 Secondary hyperparathyroidism of renal origin: Secondary | ICD-10-CM | POA: Diagnosis not present

## 2023-07-06 DIAGNOSIS — D509 Iron deficiency anemia, unspecified: Secondary | ICD-10-CM | POA: Diagnosis not present

## 2023-07-06 DIAGNOSIS — E876 Hypokalemia: Secondary | ICD-10-CM | POA: Diagnosis not present

## 2023-07-06 DIAGNOSIS — T7840XD Allergy, unspecified, subsequent encounter: Secondary | ICD-10-CM | POA: Diagnosis not present

## 2023-07-09 DIAGNOSIS — D631 Anemia in chronic kidney disease: Secondary | ICD-10-CM | POA: Diagnosis not present

## 2023-07-09 DIAGNOSIS — D509 Iron deficiency anemia, unspecified: Secondary | ICD-10-CM | POA: Diagnosis not present

## 2023-07-09 DIAGNOSIS — N186 End stage renal disease: Secondary | ICD-10-CM | POA: Diagnosis not present

## 2023-07-09 DIAGNOSIS — Z992 Dependence on renal dialysis: Secondary | ICD-10-CM | POA: Diagnosis not present

## 2023-07-09 DIAGNOSIS — N2581 Secondary hyperparathyroidism of renal origin: Secondary | ICD-10-CM | POA: Diagnosis not present

## 2023-07-09 DIAGNOSIS — E876 Hypokalemia: Secondary | ICD-10-CM | POA: Diagnosis not present

## 2023-07-09 DIAGNOSIS — D689 Coagulation defect, unspecified: Secondary | ICD-10-CM | POA: Diagnosis not present

## 2023-07-09 DIAGNOSIS — T7840XD Allergy, unspecified, subsequent encounter: Secondary | ICD-10-CM | POA: Diagnosis not present

## 2023-07-11 DIAGNOSIS — E876 Hypokalemia: Secondary | ICD-10-CM | POA: Diagnosis not present

## 2023-07-11 DIAGNOSIS — D689 Coagulation defect, unspecified: Secondary | ICD-10-CM | POA: Diagnosis not present

## 2023-07-11 DIAGNOSIS — N2581 Secondary hyperparathyroidism of renal origin: Secondary | ICD-10-CM | POA: Diagnosis not present

## 2023-07-11 DIAGNOSIS — D509 Iron deficiency anemia, unspecified: Secondary | ICD-10-CM | POA: Diagnosis not present

## 2023-07-11 DIAGNOSIS — T7840XD Allergy, unspecified, subsequent encounter: Secondary | ICD-10-CM | POA: Diagnosis not present

## 2023-07-11 DIAGNOSIS — N186 End stage renal disease: Secondary | ICD-10-CM | POA: Diagnosis not present

## 2023-07-11 DIAGNOSIS — D631 Anemia in chronic kidney disease: Secondary | ICD-10-CM | POA: Diagnosis not present

## 2023-07-11 DIAGNOSIS — Z992 Dependence on renal dialysis: Secondary | ICD-10-CM | POA: Diagnosis not present

## 2023-07-13 DIAGNOSIS — N2581 Secondary hyperparathyroidism of renal origin: Secondary | ICD-10-CM | POA: Diagnosis not present

## 2023-07-13 DIAGNOSIS — D689 Coagulation defect, unspecified: Secondary | ICD-10-CM | POA: Diagnosis not present

## 2023-07-13 DIAGNOSIS — T7840XD Allergy, unspecified, subsequent encounter: Secondary | ICD-10-CM | POA: Diagnosis not present

## 2023-07-13 DIAGNOSIS — D509 Iron deficiency anemia, unspecified: Secondary | ICD-10-CM | POA: Diagnosis not present

## 2023-07-13 DIAGNOSIS — E876 Hypokalemia: Secondary | ICD-10-CM | POA: Diagnosis not present

## 2023-07-13 DIAGNOSIS — N186 End stage renal disease: Secondary | ICD-10-CM | POA: Diagnosis not present

## 2023-07-13 DIAGNOSIS — D631 Anemia in chronic kidney disease: Secondary | ICD-10-CM | POA: Diagnosis not present

## 2023-07-13 DIAGNOSIS — Z992 Dependence on renal dialysis: Secondary | ICD-10-CM | POA: Diagnosis not present

## 2023-07-16 DIAGNOSIS — N186 End stage renal disease: Secondary | ICD-10-CM | POA: Diagnosis not present

## 2023-07-16 DIAGNOSIS — T7840XD Allergy, unspecified, subsequent encounter: Secondary | ICD-10-CM | POA: Diagnosis not present

## 2023-07-16 DIAGNOSIS — N2581 Secondary hyperparathyroidism of renal origin: Secondary | ICD-10-CM | POA: Diagnosis not present

## 2023-07-16 DIAGNOSIS — D509 Iron deficiency anemia, unspecified: Secondary | ICD-10-CM | POA: Diagnosis not present

## 2023-07-16 DIAGNOSIS — E876 Hypokalemia: Secondary | ICD-10-CM | POA: Diagnosis not present

## 2023-07-16 DIAGNOSIS — D631 Anemia in chronic kidney disease: Secondary | ICD-10-CM | POA: Diagnosis not present

## 2023-07-16 DIAGNOSIS — D689 Coagulation defect, unspecified: Secondary | ICD-10-CM | POA: Diagnosis not present

## 2023-07-16 DIAGNOSIS — Z992 Dependence on renal dialysis: Secondary | ICD-10-CM | POA: Diagnosis not present

## 2023-07-18 DIAGNOSIS — N2581 Secondary hyperparathyroidism of renal origin: Secondary | ICD-10-CM | POA: Diagnosis not present

## 2023-07-18 DIAGNOSIS — N186 End stage renal disease: Secondary | ICD-10-CM | POA: Diagnosis not present

## 2023-07-18 DIAGNOSIS — D689 Coagulation defect, unspecified: Secondary | ICD-10-CM | POA: Diagnosis not present

## 2023-07-18 DIAGNOSIS — Z992 Dependence on renal dialysis: Secondary | ICD-10-CM | POA: Diagnosis not present

## 2023-07-18 DIAGNOSIS — D509 Iron deficiency anemia, unspecified: Secondary | ICD-10-CM | POA: Diagnosis not present

## 2023-07-18 DIAGNOSIS — T7840XD Allergy, unspecified, subsequent encounter: Secondary | ICD-10-CM | POA: Diagnosis not present

## 2023-07-18 DIAGNOSIS — E876 Hypokalemia: Secondary | ICD-10-CM | POA: Diagnosis not present

## 2023-07-18 DIAGNOSIS — D631 Anemia in chronic kidney disease: Secondary | ICD-10-CM | POA: Diagnosis not present

## 2023-07-19 DIAGNOSIS — Z992 Dependence on renal dialysis: Secondary | ICD-10-CM | POA: Diagnosis not present

## 2023-07-19 DIAGNOSIS — N186 End stage renal disease: Secondary | ICD-10-CM | POA: Diagnosis not present

## 2023-07-20 DIAGNOSIS — E876 Hypokalemia: Secondary | ICD-10-CM | POA: Diagnosis not present

## 2023-07-20 DIAGNOSIS — T7840XD Allergy, unspecified, subsequent encounter: Secondary | ICD-10-CM | POA: Diagnosis not present

## 2023-07-20 DIAGNOSIS — D631 Anemia in chronic kidney disease: Secondary | ICD-10-CM | POA: Diagnosis not present

## 2023-07-20 DIAGNOSIS — D689 Coagulation defect, unspecified: Secondary | ICD-10-CM | POA: Diagnosis not present

## 2023-07-20 DIAGNOSIS — Z992 Dependence on renal dialysis: Secondary | ICD-10-CM | POA: Diagnosis not present

## 2023-07-20 DIAGNOSIS — D509 Iron deficiency anemia, unspecified: Secondary | ICD-10-CM | POA: Diagnosis not present

## 2023-07-20 DIAGNOSIS — N2581 Secondary hyperparathyroidism of renal origin: Secondary | ICD-10-CM | POA: Diagnosis not present

## 2023-07-20 DIAGNOSIS — N186 End stage renal disease: Secondary | ICD-10-CM | POA: Diagnosis not present

## 2023-07-23 DIAGNOSIS — N2581 Secondary hyperparathyroidism of renal origin: Secondary | ICD-10-CM | POA: Diagnosis not present

## 2023-07-23 DIAGNOSIS — N186 End stage renal disease: Secondary | ICD-10-CM | POA: Diagnosis not present

## 2023-07-23 DIAGNOSIS — D509 Iron deficiency anemia, unspecified: Secondary | ICD-10-CM | POA: Diagnosis not present

## 2023-07-23 DIAGNOSIS — D631 Anemia in chronic kidney disease: Secondary | ICD-10-CM | POA: Diagnosis not present

## 2023-07-23 DIAGNOSIS — T7840XD Allergy, unspecified, subsequent encounter: Secondary | ICD-10-CM | POA: Diagnosis not present

## 2023-07-23 DIAGNOSIS — E876 Hypokalemia: Secondary | ICD-10-CM | POA: Diagnosis not present

## 2023-07-23 DIAGNOSIS — D689 Coagulation defect, unspecified: Secondary | ICD-10-CM | POA: Diagnosis not present

## 2023-07-23 DIAGNOSIS — Z992 Dependence on renal dialysis: Secondary | ICD-10-CM | POA: Diagnosis not present

## 2023-07-25 DIAGNOSIS — D631 Anemia in chronic kidney disease: Secondary | ICD-10-CM | POA: Diagnosis not present

## 2023-07-25 DIAGNOSIS — N186 End stage renal disease: Secondary | ICD-10-CM | POA: Diagnosis not present

## 2023-07-25 DIAGNOSIS — N2581 Secondary hyperparathyroidism of renal origin: Secondary | ICD-10-CM | POA: Diagnosis not present

## 2023-07-25 DIAGNOSIS — T7840XD Allergy, unspecified, subsequent encounter: Secondary | ICD-10-CM | POA: Diagnosis not present

## 2023-07-25 DIAGNOSIS — E876 Hypokalemia: Secondary | ICD-10-CM | POA: Diagnosis not present

## 2023-07-25 DIAGNOSIS — D689 Coagulation defect, unspecified: Secondary | ICD-10-CM | POA: Diagnosis not present

## 2023-07-25 DIAGNOSIS — D509 Iron deficiency anemia, unspecified: Secondary | ICD-10-CM | POA: Diagnosis not present

## 2023-07-25 DIAGNOSIS — Z992 Dependence on renal dialysis: Secondary | ICD-10-CM | POA: Diagnosis not present

## 2023-07-27 DIAGNOSIS — E876 Hypokalemia: Secondary | ICD-10-CM | POA: Diagnosis not present

## 2023-07-27 DIAGNOSIS — N2581 Secondary hyperparathyroidism of renal origin: Secondary | ICD-10-CM | POA: Diagnosis not present

## 2023-07-27 DIAGNOSIS — D631 Anemia in chronic kidney disease: Secondary | ICD-10-CM | POA: Diagnosis not present

## 2023-07-27 DIAGNOSIS — T7840XD Allergy, unspecified, subsequent encounter: Secondary | ICD-10-CM | POA: Diagnosis not present

## 2023-07-27 DIAGNOSIS — Z992 Dependence on renal dialysis: Secondary | ICD-10-CM | POA: Diagnosis not present

## 2023-07-27 DIAGNOSIS — D509 Iron deficiency anemia, unspecified: Secondary | ICD-10-CM | POA: Diagnosis not present

## 2023-07-27 DIAGNOSIS — N186 End stage renal disease: Secondary | ICD-10-CM | POA: Diagnosis not present

## 2023-07-27 DIAGNOSIS — D689 Coagulation defect, unspecified: Secondary | ICD-10-CM | POA: Diagnosis not present

## 2023-07-28 DIAGNOSIS — Z992 Dependence on renal dialysis: Secondary | ICD-10-CM | POA: Diagnosis not present

## 2023-07-28 DIAGNOSIS — N186 End stage renal disease: Secondary | ICD-10-CM | POA: Diagnosis not present

## 2023-07-28 DIAGNOSIS — E1122 Type 2 diabetes mellitus with diabetic chronic kidney disease: Secondary | ICD-10-CM | POA: Diagnosis not present

## 2023-07-30 DIAGNOSIS — E876 Hypokalemia: Secondary | ICD-10-CM | POA: Diagnosis not present

## 2023-07-30 DIAGNOSIS — E1122 Type 2 diabetes mellitus with diabetic chronic kidney disease: Secondary | ICD-10-CM | POA: Diagnosis not present

## 2023-07-30 DIAGNOSIS — N2581 Secondary hyperparathyroidism of renal origin: Secondary | ICD-10-CM | POA: Diagnosis not present

## 2023-07-30 DIAGNOSIS — N186 End stage renal disease: Secondary | ICD-10-CM | POA: Diagnosis not present

## 2023-07-30 DIAGNOSIS — Z992 Dependence on renal dialysis: Secondary | ICD-10-CM | POA: Diagnosis not present

## 2023-07-30 DIAGNOSIS — D631 Anemia in chronic kidney disease: Secondary | ICD-10-CM | POA: Diagnosis not present

## 2023-07-30 DIAGNOSIS — D689 Coagulation defect, unspecified: Secondary | ICD-10-CM | POA: Diagnosis not present

## 2023-07-30 DIAGNOSIS — T7840XD Allergy, unspecified, subsequent encounter: Secondary | ICD-10-CM | POA: Diagnosis not present

## 2023-08-01 DIAGNOSIS — Z992 Dependence on renal dialysis: Secondary | ICD-10-CM | POA: Diagnosis not present

## 2023-08-01 DIAGNOSIS — E1122 Type 2 diabetes mellitus with diabetic chronic kidney disease: Secondary | ICD-10-CM | POA: Diagnosis not present

## 2023-08-01 DIAGNOSIS — E876 Hypokalemia: Secondary | ICD-10-CM | POA: Diagnosis not present

## 2023-08-01 DIAGNOSIS — D689 Coagulation defect, unspecified: Secondary | ICD-10-CM | POA: Diagnosis not present

## 2023-08-01 DIAGNOSIS — D631 Anemia in chronic kidney disease: Secondary | ICD-10-CM | POA: Diagnosis not present

## 2023-08-01 DIAGNOSIS — N2581 Secondary hyperparathyroidism of renal origin: Secondary | ICD-10-CM | POA: Diagnosis not present

## 2023-08-01 DIAGNOSIS — N186 End stage renal disease: Secondary | ICD-10-CM | POA: Diagnosis not present

## 2023-08-01 DIAGNOSIS — T7840XD Allergy, unspecified, subsequent encounter: Secondary | ICD-10-CM | POA: Diagnosis not present

## 2023-08-03 DIAGNOSIS — E876 Hypokalemia: Secondary | ICD-10-CM | POA: Diagnosis not present

## 2023-08-03 DIAGNOSIS — N186 End stage renal disease: Secondary | ICD-10-CM | POA: Diagnosis not present

## 2023-08-03 DIAGNOSIS — D689 Coagulation defect, unspecified: Secondary | ICD-10-CM | POA: Diagnosis not present

## 2023-08-03 DIAGNOSIS — E1122 Type 2 diabetes mellitus with diabetic chronic kidney disease: Secondary | ICD-10-CM | POA: Diagnosis not present

## 2023-08-03 DIAGNOSIS — D631 Anemia in chronic kidney disease: Secondary | ICD-10-CM | POA: Diagnosis not present

## 2023-08-03 DIAGNOSIS — T7840XD Allergy, unspecified, subsequent encounter: Secondary | ICD-10-CM | POA: Diagnosis not present

## 2023-08-03 DIAGNOSIS — Z992 Dependence on renal dialysis: Secondary | ICD-10-CM | POA: Diagnosis not present

## 2023-08-03 DIAGNOSIS — N2581 Secondary hyperparathyroidism of renal origin: Secondary | ICD-10-CM | POA: Diagnosis not present

## 2023-08-06 DIAGNOSIS — N186 End stage renal disease: Secondary | ICD-10-CM | POA: Diagnosis not present

## 2023-08-06 DIAGNOSIS — D631 Anemia in chronic kidney disease: Secondary | ICD-10-CM | POA: Diagnosis not present

## 2023-08-06 DIAGNOSIS — E876 Hypokalemia: Secondary | ICD-10-CM | POA: Diagnosis not present

## 2023-08-06 DIAGNOSIS — N2581 Secondary hyperparathyroidism of renal origin: Secondary | ICD-10-CM | POA: Diagnosis not present

## 2023-08-06 DIAGNOSIS — T7840XD Allergy, unspecified, subsequent encounter: Secondary | ICD-10-CM | POA: Diagnosis not present

## 2023-08-06 DIAGNOSIS — D689 Coagulation defect, unspecified: Secondary | ICD-10-CM | POA: Diagnosis not present

## 2023-08-06 DIAGNOSIS — Z992 Dependence on renal dialysis: Secondary | ICD-10-CM | POA: Diagnosis not present

## 2023-08-06 DIAGNOSIS — E1122 Type 2 diabetes mellitus with diabetic chronic kidney disease: Secondary | ICD-10-CM | POA: Diagnosis not present

## 2023-08-08 DIAGNOSIS — E876 Hypokalemia: Secondary | ICD-10-CM | POA: Diagnosis not present

## 2023-08-08 DIAGNOSIS — T7840XD Allergy, unspecified, subsequent encounter: Secondary | ICD-10-CM | POA: Diagnosis not present

## 2023-08-08 DIAGNOSIS — D689 Coagulation defect, unspecified: Secondary | ICD-10-CM | POA: Diagnosis not present

## 2023-08-08 DIAGNOSIS — N186 End stage renal disease: Secondary | ICD-10-CM | POA: Diagnosis not present

## 2023-08-08 DIAGNOSIS — Z992 Dependence on renal dialysis: Secondary | ICD-10-CM | POA: Diagnosis not present

## 2023-08-08 DIAGNOSIS — E1122 Type 2 diabetes mellitus with diabetic chronic kidney disease: Secondary | ICD-10-CM | POA: Diagnosis not present

## 2023-08-08 DIAGNOSIS — N2581 Secondary hyperparathyroidism of renal origin: Secondary | ICD-10-CM | POA: Diagnosis not present

## 2023-08-08 DIAGNOSIS — D631 Anemia in chronic kidney disease: Secondary | ICD-10-CM | POA: Diagnosis not present

## 2023-08-10 DIAGNOSIS — T7840XD Allergy, unspecified, subsequent encounter: Secondary | ICD-10-CM | POA: Diagnosis not present

## 2023-08-10 DIAGNOSIS — D631 Anemia in chronic kidney disease: Secondary | ICD-10-CM | POA: Diagnosis not present

## 2023-08-10 DIAGNOSIS — E876 Hypokalemia: Secondary | ICD-10-CM | POA: Diagnosis not present

## 2023-08-10 DIAGNOSIS — Z992 Dependence on renal dialysis: Secondary | ICD-10-CM | POA: Diagnosis not present

## 2023-08-10 DIAGNOSIS — D689 Coagulation defect, unspecified: Secondary | ICD-10-CM | POA: Diagnosis not present

## 2023-08-10 DIAGNOSIS — E1122 Type 2 diabetes mellitus with diabetic chronic kidney disease: Secondary | ICD-10-CM | POA: Diagnosis not present

## 2023-08-10 DIAGNOSIS — N186 End stage renal disease: Secondary | ICD-10-CM | POA: Diagnosis not present

## 2023-08-10 DIAGNOSIS — N2581 Secondary hyperparathyroidism of renal origin: Secondary | ICD-10-CM | POA: Diagnosis not present

## 2023-08-13 DIAGNOSIS — N186 End stage renal disease: Secondary | ICD-10-CM | POA: Diagnosis not present

## 2023-08-13 DIAGNOSIS — T7840XD Allergy, unspecified, subsequent encounter: Secondary | ICD-10-CM | POA: Diagnosis not present

## 2023-08-13 DIAGNOSIS — E1122 Type 2 diabetes mellitus with diabetic chronic kidney disease: Secondary | ICD-10-CM | POA: Diagnosis not present

## 2023-08-13 DIAGNOSIS — N2581 Secondary hyperparathyroidism of renal origin: Secondary | ICD-10-CM | POA: Diagnosis not present

## 2023-08-13 DIAGNOSIS — D631 Anemia in chronic kidney disease: Secondary | ICD-10-CM | POA: Diagnosis not present

## 2023-08-13 DIAGNOSIS — D689 Coagulation defect, unspecified: Secondary | ICD-10-CM | POA: Diagnosis not present

## 2023-08-13 DIAGNOSIS — Z992 Dependence on renal dialysis: Secondary | ICD-10-CM | POA: Diagnosis not present

## 2023-08-13 DIAGNOSIS — E876 Hypokalemia: Secondary | ICD-10-CM | POA: Diagnosis not present

## 2023-08-15 DIAGNOSIS — D689 Coagulation defect, unspecified: Secondary | ICD-10-CM | POA: Diagnosis not present

## 2023-08-15 DIAGNOSIS — E876 Hypokalemia: Secondary | ICD-10-CM | POA: Diagnosis not present

## 2023-08-15 DIAGNOSIS — T7840XD Allergy, unspecified, subsequent encounter: Secondary | ICD-10-CM | POA: Diagnosis not present

## 2023-08-15 DIAGNOSIS — E1122 Type 2 diabetes mellitus with diabetic chronic kidney disease: Secondary | ICD-10-CM | POA: Diagnosis not present

## 2023-08-15 DIAGNOSIS — N186 End stage renal disease: Secondary | ICD-10-CM | POA: Diagnosis not present

## 2023-08-15 DIAGNOSIS — N2581 Secondary hyperparathyroidism of renal origin: Secondary | ICD-10-CM | POA: Diagnosis not present

## 2023-08-15 DIAGNOSIS — D631 Anemia in chronic kidney disease: Secondary | ICD-10-CM | POA: Diagnosis not present

## 2023-08-15 DIAGNOSIS — Z992 Dependence on renal dialysis: Secondary | ICD-10-CM | POA: Diagnosis not present

## 2023-08-16 DIAGNOSIS — D689 Coagulation defect, unspecified: Secondary | ICD-10-CM | POA: Diagnosis not present

## 2023-08-16 DIAGNOSIS — D631 Anemia in chronic kidney disease: Secondary | ICD-10-CM | POA: Diagnosis not present

## 2023-08-16 DIAGNOSIS — Z992 Dependence on renal dialysis: Secondary | ICD-10-CM | POA: Diagnosis not present

## 2023-08-16 DIAGNOSIS — E1122 Type 2 diabetes mellitus with diabetic chronic kidney disease: Secondary | ICD-10-CM | POA: Diagnosis not present

## 2023-08-16 DIAGNOSIS — E876 Hypokalemia: Secondary | ICD-10-CM | POA: Diagnosis not present

## 2023-08-16 DIAGNOSIS — N2581 Secondary hyperparathyroidism of renal origin: Secondary | ICD-10-CM | POA: Diagnosis not present

## 2023-08-16 DIAGNOSIS — T7840XD Allergy, unspecified, subsequent encounter: Secondary | ICD-10-CM | POA: Diagnosis not present

## 2023-08-16 DIAGNOSIS — N186 End stage renal disease: Secondary | ICD-10-CM | POA: Diagnosis not present

## 2023-08-20 DIAGNOSIS — E1122 Type 2 diabetes mellitus with diabetic chronic kidney disease: Secondary | ICD-10-CM | POA: Diagnosis not present

## 2023-08-20 DIAGNOSIS — N186 End stage renal disease: Secondary | ICD-10-CM | POA: Diagnosis not present

## 2023-08-20 DIAGNOSIS — D689 Coagulation defect, unspecified: Secondary | ICD-10-CM | POA: Diagnosis not present

## 2023-08-20 DIAGNOSIS — T7840XD Allergy, unspecified, subsequent encounter: Secondary | ICD-10-CM | POA: Diagnosis not present

## 2023-08-20 DIAGNOSIS — Z992 Dependence on renal dialysis: Secondary | ICD-10-CM | POA: Diagnosis not present

## 2023-08-20 DIAGNOSIS — N2581 Secondary hyperparathyroidism of renal origin: Secondary | ICD-10-CM | POA: Diagnosis not present

## 2023-08-20 DIAGNOSIS — D631 Anemia in chronic kidney disease: Secondary | ICD-10-CM | POA: Diagnosis not present

## 2023-08-20 DIAGNOSIS — E876 Hypokalemia: Secondary | ICD-10-CM | POA: Diagnosis not present

## 2023-08-22 DIAGNOSIS — D631 Anemia in chronic kidney disease: Secondary | ICD-10-CM | POA: Diagnosis not present

## 2023-08-22 DIAGNOSIS — N186 End stage renal disease: Secondary | ICD-10-CM | POA: Diagnosis not present

## 2023-08-22 DIAGNOSIS — E876 Hypokalemia: Secondary | ICD-10-CM | POA: Diagnosis not present

## 2023-08-22 DIAGNOSIS — Z992 Dependence on renal dialysis: Secondary | ICD-10-CM | POA: Diagnosis not present

## 2023-08-22 DIAGNOSIS — E1122 Type 2 diabetes mellitus with diabetic chronic kidney disease: Secondary | ICD-10-CM | POA: Diagnosis not present

## 2023-08-22 DIAGNOSIS — T7840XD Allergy, unspecified, subsequent encounter: Secondary | ICD-10-CM | POA: Diagnosis not present

## 2023-08-22 DIAGNOSIS — N2581 Secondary hyperparathyroidism of renal origin: Secondary | ICD-10-CM | POA: Diagnosis not present

## 2023-08-22 DIAGNOSIS — D689 Coagulation defect, unspecified: Secondary | ICD-10-CM | POA: Diagnosis not present

## 2023-08-24 DIAGNOSIS — E1122 Type 2 diabetes mellitus with diabetic chronic kidney disease: Secondary | ICD-10-CM | POA: Diagnosis not present

## 2023-08-24 DIAGNOSIS — N186 End stage renal disease: Secondary | ICD-10-CM | POA: Diagnosis not present

## 2023-08-24 DIAGNOSIS — E876 Hypokalemia: Secondary | ICD-10-CM | POA: Diagnosis not present

## 2023-08-24 DIAGNOSIS — T7840XD Allergy, unspecified, subsequent encounter: Secondary | ICD-10-CM | POA: Diagnosis not present

## 2023-08-24 DIAGNOSIS — D689 Coagulation defect, unspecified: Secondary | ICD-10-CM | POA: Diagnosis not present

## 2023-08-24 DIAGNOSIS — D631 Anemia in chronic kidney disease: Secondary | ICD-10-CM | POA: Diagnosis not present

## 2023-08-24 DIAGNOSIS — Z992 Dependence on renal dialysis: Secondary | ICD-10-CM | POA: Diagnosis not present

## 2023-08-24 DIAGNOSIS — N2581 Secondary hyperparathyroidism of renal origin: Secondary | ICD-10-CM | POA: Diagnosis not present

## 2023-08-27 DIAGNOSIS — Z992 Dependence on renal dialysis: Secondary | ICD-10-CM | POA: Diagnosis not present

## 2023-08-27 DIAGNOSIS — D689 Coagulation defect, unspecified: Secondary | ICD-10-CM | POA: Diagnosis not present

## 2023-08-27 DIAGNOSIS — N2581 Secondary hyperparathyroidism of renal origin: Secondary | ICD-10-CM | POA: Diagnosis not present

## 2023-08-27 DIAGNOSIS — N186 End stage renal disease: Secondary | ICD-10-CM | POA: Diagnosis not present

## 2023-08-27 DIAGNOSIS — E1122 Type 2 diabetes mellitus with diabetic chronic kidney disease: Secondary | ICD-10-CM | POA: Diagnosis not present

## 2023-08-27 DIAGNOSIS — D631 Anemia in chronic kidney disease: Secondary | ICD-10-CM | POA: Diagnosis not present

## 2023-08-27 DIAGNOSIS — E876 Hypokalemia: Secondary | ICD-10-CM | POA: Diagnosis not present

## 2023-08-27 DIAGNOSIS — T7840XD Allergy, unspecified, subsequent encounter: Secondary | ICD-10-CM | POA: Diagnosis not present

## 2023-08-29 DIAGNOSIS — N186 End stage renal disease: Secondary | ICD-10-CM | POA: Diagnosis not present

## 2023-08-29 DIAGNOSIS — Z992 Dependence on renal dialysis: Secondary | ICD-10-CM | POA: Diagnosis not present

## 2023-08-29 DIAGNOSIS — T7840XD Allergy, unspecified, subsequent encounter: Secondary | ICD-10-CM | POA: Diagnosis not present

## 2023-08-29 DIAGNOSIS — D689 Coagulation defect, unspecified: Secondary | ICD-10-CM | POA: Diagnosis not present

## 2023-08-29 DIAGNOSIS — D631 Anemia in chronic kidney disease: Secondary | ICD-10-CM | POA: Diagnosis not present

## 2023-08-29 DIAGNOSIS — N2581 Secondary hyperparathyroidism of renal origin: Secondary | ICD-10-CM | POA: Diagnosis not present

## 2023-08-29 DIAGNOSIS — E876 Hypokalemia: Secondary | ICD-10-CM | POA: Diagnosis not present

## 2023-08-31 DIAGNOSIS — N2581 Secondary hyperparathyroidism of renal origin: Secondary | ICD-10-CM | POA: Diagnosis not present

## 2023-08-31 DIAGNOSIS — D689 Coagulation defect, unspecified: Secondary | ICD-10-CM | POA: Diagnosis not present

## 2023-08-31 DIAGNOSIS — T7840XD Allergy, unspecified, subsequent encounter: Secondary | ICD-10-CM | POA: Diagnosis not present

## 2023-08-31 DIAGNOSIS — E876 Hypokalemia: Secondary | ICD-10-CM | POA: Diagnosis not present

## 2023-08-31 DIAGNOSIS — N186 End stage renal disease: Secondary | ICD-10-CM | POA: Diagnosis not present

## 2023-08-31 DIAGNOSIS — D631 Anemia in chronic kidney disease: Secondary | ICD-10-CM | POA: Diagnosis not present

## 2023-08-31 DIAGNOSIS — Z992 Dependence on renal dialysis: Secondary | ICD-10-CM | POA: Diagnosis not present

## 2023-09-03 DIAGNOSIS — N186 End stage renal disease: Secondary | ICD-10-CM | POA: Diagnosis not present

## 2023-09-03 DIAGNOSIS — D689 Coagulation defect, unspecified: Secondary | ICD-10-CM | POA: Diagnosis not present

## 2023-09-03 DIAGNOSIS — N2581 Secondary hyperparathyroidism of renal origin: Secondary | ICD-10-CM | POA: Diagnosis not present

## 2023-09-03 DIAGNOSIS — D631 Anemia in chronic kidney disease: Secondary | ICD-10-CM | POA: Diagnosis not present

## 2023-09-03 DIAGNOSIS — E876 Hypokalemia: Secondary | ICD-10-CM | POA: Diagnosis not present

## 2023-09-03 DIAGNOSIS — Z992 Dependence on renal dialysis: Secondary | ICD-10-CM | POA: Diagnosis not present

## 2023-09-03 DIAGNOSIS — T7840XD Allergy, unspecified, subsequent encounter: Secondary | ICD-10-CM | POA: Diagnosis not present

## 2023-09-05 DIAGNOSIS — D689 Coagulation defect, unspecified: Secondary | ICD-10-CM | POA: Diagnosis not present

## 2023-09-05 DIAGNOSIS — Z992 Dependence on renal dialysis: Secondary | ICD-10-CM | POA: Diagnosis not present

## 2023-09-05 DIAGNOSIS — N2581 Secondary hyperparathyroidism of renal origin: Secondary | ICD-10-CM | POA: Diagnosis not present

## 2023-09-05 DIAGNOSIS — D631 Anemia in chronic kidney disease: Secondary | ICD-10-CM | POA: Diagnosis not present

## 2023-09-05 DIAGNOSIS — N186 End stage renal disease: Secondary | ICD-10-CM | POA: Diagnosis not present

## 2023-09-05 DIAGNOSIS — E876 Hypokalemia: Secondary | ICD-10-CM | POA: Diagnosis not present

## 2023-09-05 DIAGNOSIS — T7840XD Allergy, unspecified, subsequent encounter: Secondary | ICD-10-CM | POA: Diagnosis not present

## 2023-09-07 DIAGNOSIS — T7840XD Allergy, unspecified, subsequent encounter: Secondary | ICD-10-CM | POA: Diagnosis not present

## 2023-09-07 DIAGNOSIS — E876 Hypokalemia: Secondary | ICD-10-CM | POA: Diagnosis not present

## 2023-09-07 DIAGNOSIS — Z992 Dependence on renal dialysis: Secondary | ICD-10-CM | POA: Diagnosis not present

## 2023-09-07 DIAGNOSIS — N2581 Secondary hyperparathyroidism of renal origin: Secondary | ICD-10-CM | POA: Diagnosis not present

## 2023-09-07 DIAGNOSIS — N186 End stage renal disease: Secondary | ICD-10-CM | POA: Diagnosis not present

## 2023-09-07 DIAGNOSIS — D689 Coagulation defect, unspecified: Secondary | ICD-10-CM | POA: Diagnosis not present

## 2023-09-07 DIAGNOSIS — D631 Anemia in chronic kidney disease: Secondary | ICD-10-CM | POA: Diagnosis not present

## 2023-09-10 DIAGNOSIS — Z992 Dependence on renal dialysis: Secondary | ICD-10-CM | POA: Diagnosis not present

## 2023-09-10 DIAGNOSIS — N2581 Secondary hyperparathyroidism of renal origin: Secondary | ICD-10-CM | POA: Diagnosis not present

## 2023-09-10 DIAGNOSIS — T7840XD Allergy, unspecified, subsequent encounter: Secondary | ICD-10-CM | POA: Diagnosis not present

## 2023-09-10 DIAGNOSIS — E876 Hypokalemia: Secondary | ICD-10-CM | POA: Diagnosis not present

## 2023-09-10 DIAGNOSIS — N186 End stage renal disease: Secondary | ICD-10-CM | POA: Diagnosis not present

## 2023-09-10 DIAGNOSIS — D689 Coagulation defect, unspecified: Secondary | ICD-10-CM | POA: Diagnosis not present

## 2023-09-10 DIAGNOSIS — D631 Anemia in chronic kidney disease: Secondary | ICD-10-CM | POA: Diagnosis not present

## 2023-09-12 DIAGNOSIS — T7840XD Allergy, unspecified, subsequent encounter: Secondary | ICD-10-CM | POA: Diagnosis not present

## 2023-09-12 DIAGNOSIS — E876 Hypokalemia: Secondary | ICD-10-CM | POA: Diagnosis not present

## 2023-09-12 DIAGNOSIS — N186 End stage renal disease: Secondary | ICD-10-CM | POA: Diagnosis not present

## 2023-09-12 DIAGNOSIS — N2581 Secondary hyperparathyroidism of renal origin: Secondary | ICD-10-CM | POA: Diagnosis not present

## 2023-09-12 DIAGNOSIS — D631 Anemia in chronic kidney disease: Secondary | ICD-10-CM | POA: Diagnosis not present

## 2023-09-12 DIAGNOSIS — D689 Coagulation defect, unspecified: Secondary | ICD-10-CM | POA: Diagnosis not present

## 2023-09-12 DIAGNOSIS — Z992 Dependence on renal dialysis: Secondary | ICD-10-CM | POA: Diagnosis not present

## 2023-09-13 DIAGNOSIS — Z992 Dependence on renal dialysis: Secondary | ICD-10-CM | POA: Diagnosis not present

## 2023-09-13 DIAGNOSIS — N2581 Secondary hyperparathyroidism of renal origin: Secondary | ICD-10-CM | POA: Diagnosis not present

## 2023-09-13 DIAGNOSIS — E876 Hypokalemia: Secondary | ICD-10-CM | POA: Diagnosis not present

## 2023-09-13 DIAGNOSIS — D631 Anemia in chronic kidney disease: Secondary | ICD-10-CM | POA: Diagnosis not present

## 2023-09-13 DIAGNOSIS — T7840XD Allergy, unspecified, subsequent encounter: Secondary | ICD-10-CM | POA: Diagnosis not present

## 2023-09-13 DIAGNOSIS — D689 Coagulation defect, unspecified: Secondary | ICD-10-CM | POA: Diagnosis not present

## 2023-09-13 DIAGNOSIS — N186 End stage renal disease: Secondary | ICD-10-CM | POA: Diagnosis not present

## 2023-09-17 DIAGNOSIS — Z992 Dependence on renal dialysis: Secondary | ICD-10-CM | POA: Diagnosis not present

## 2023-09-17 DIAGNOSIS — D689 Coagulation defect, unspecified: Secondary | ICD-10-CM | POA: Diagnosis not present

## 2023-09-17 DIAGNOSIS — N2581 Secondary hyperparathyroidism of renal origin: Secondary | ICD-10-CM | POA: Diagnosis not present

## 2023-09-17 DIAGNOSIS — E876 Hypokalemia: Secondary | ICD-10-CM | POA: Diagnosis not present

## 2023-09-17 DIAGNOSIS — N186 End stage renal disease: Secondary | ICD-10-CM | POA: Diagnosis not present

## 2023-09-17 DIAGNOSIS — T7840XD Allergy, unspecified, subsequent encounter: Secondary | ICD-10-CM | POA: Diagnosis not present

## 2023-09-17 DIAGNOSIS — D631 Anemia in chronic kidney disease: Secondary | ICD-10-CM | POA: Diagnosis not present

## 2023-09-19 DIAGNOSIS — N2581 Secondary hyperparathyroidism of renal origin: Secondary | ICD-10-CM | POA: Diagnosis not present

## 2023-09-19 DIAGNOSIS — D631 Anemia in chronic kidney disease: Secondary | ICD-10-CM | POA: Diagnosis not present

## 2023-09-19 DIAGNOSIS — D689 Coagulation defect, unspecified: Secondary | ICD-10-CM | POA: Diagnosis not present

## 2023-09-19 DIAGNOSIS — N186 End stage renal disease: Secondary | ICD-10-CM | POA: Diagnosis not present

## 2023-09-19 DIAGNOSIS — Z992 Dependence on renal dialysis: Secondary | ICD-10-CM | POA: Diagnosis not present

## 2023-09-19 DIAGNOSIS — E876 Hypokalemia: Secondary | ICD-10-CM | POA: Diagnosis not present

## 2023-09-19 DIAGNOSIS — T7840XD Allergy, unspecified, subsequent encounter: Secondary | ICD-10-CM | POA: Diagnosis not present

## 2023-09-21 DIAGNOSIS — N2581 Secondary hyperparathyroidism of renal origin: Secondary | ICD-10-CM | POA: Diagnosis not present

## 2023-09-21 DIAGNOSIS — E876 Hypokalemia: Secondary | ICD-10-CM | POA: Diagnosis not present

## 2023-09-21 DIAGNOSIS — D689 Coagulation defect, unspecified: Secondary | ICD-10-CM | POA: Diagnosis not present

## 2023-09-21 DIAGNOSIS — Z992 Dependence on renal dialysis: Secondary | ICD-10-CM | POA: Diagnosis not present

## 2023-09-21 DIAGNOSIS — D631 Anemia in chronic kidney disease: Secondary | ICD-10-CM | POA: Diagnosis not present

## 2023-09-21 DIAGNOSIS — T7840XD Allergy, unspecified, subsequent encounter: Secondary | ICD-10-CM | POA: Diagnosis not present

## 2023-09-21 DIAGNOSIS — N186 End stage renal disease: Secondary | ICD-10-CM | POA: Diagnosis not present

## 2023-09-24 DIAGNOSIS — Z992 Dependence on renal dialysis: Secondary | ICD-10-CM | POA: Diagnosis not present

## 2023-09-24 DIAGNOSIS — D631 Anemia in chronic kidney disease: Secondary | ICD-10-CM | POA: Diagnosis not present

## 2023-09-24 DIAGNOSIS — E876 Hypokalemia: Secondary | ICD-10-CM | POA: Diagnosis not present

## 2023-09-24 DIAGNOSIS — D689 Coagulation defect, unspecified: Secondary | ICD-10-CM | POA: Diagnosis not present

## 2023-09-24 DIAGNOSIS — T7840XD Allergy, unspecified, subsequent encounter: Secondary | ICD-10-CM | POA: Diagnosis not present

## 2023-09-24 DIAGNOSIS — N186 End stage renal disease: Secondary | ICD-10-CM | POA: Diagnosis not present

## 2023-09-24 DIAGNOSIS — N2581 Secondary hyperparathyroidism of renal origin: Secondary | ICD-10-CM | POA: Diagnosis not present

## 2023-09-26 DIAGNOSIS — N2581 Secondary hyperparathyroidism of renal origin: Secondary | ICD-10-CM | POA: Diagnosis not present

## 2023-09-26 DIAGNOSIS — Z992 Dependence on renal dialysis: Secondary | ICD-10-CM | POA: Diagnosis not present

## 2023-09-26 DIAGNOSIS — D631 Anemia in chronic kidney disease: Secondary | ICD-10-CM | POA: Diagnosis not present

## 2023-09-26 DIAGNOSIS — N186 End stage renal disease: Secondary | ICD-10-CM | POA: Diagnosis not present

## 2023-09-26 DIAGNOSIS — E876 Hypokalemia: Secondary | ICD-10-CM | POA: Diagnosis not present

## 2023-09-26 DIAGNOSIS — D689 Coagulation defect, unspecified: Secondary | ICD-10-CM | POA: Diagnosis not present

## 2023-09-26 DIAGNOSIS — T7840XD Allergy, unspecified, subsequent encounter: Secondary | ICD-10-CM | POA: Diagnosis not present

## 2023-09-27 DIAGNOSIS — N186 End stage renal disease: Secondary | ICD-10-CM | POA: Diagnosis not present

## 2023-09-27 DIAGNOSIS — E1122 Type 2 diabetes mellitus with diabetic chronic kidney disease: Secondary | ICD-10-CM | POA: Diagnosis not present

## 2023-09-27 DIAGNOSIS — Z992 Dependence on renal dialysis: Secondary | ICD-10-CM | POA: Diagnosis not present

## 2023-09-28 DIAGNOSIS — E876 Hypokalemia: Secondary | ICD-10-CM | POA: Diagnosis not present

## 2023-09-28 DIAGNOSIS — N186 End stage renal disease: Secondary | ICD-10-CM | POA: Diagnosis not present

## 2023-09-28 DIAGNOSIS — D631 Anemia in chronic kidney disease: Secondary | ICD-10-CM | POA: Diagnosis not present

## 2023-09-28 DIAGNOSIS — Z992 Dependence on renal dialysis: Secondary | ICD-10-CM | POA: Diagnosis not present

## 2023-09-28 DIAGNOSIS — D689 Coagulation defect, unspecified: Secondary | ICD-10-CM | POA: Diagnosis not present

## 2023-09-28 DIAGNOSIS — Z23 Encounter for immunization: Secondary | ICD-10-CM | POA: Diagnosis not present

## 2023-09-28 DIAGNOSIS — N2581 Secondary hyperparathyroidism of renal origin: Secondary | ICD-10-CM | POA: Diagnosis not present

## 2023-09-28 DIAGNOSIS — T7840XD Allergy, unspecified, subsequent encounter: Secondary | ICD-10-CM | POA: Diagnosis not present

## 2023-10-01 DIAGNOSIS — T7840XD Allergy, unspecified, subsequent encounter: Secondary | ICD-10-CM | POA: Diagnosis not present

## 2023-10-01 DIAGNOSIS — D689 Coagulation defect, unspecified: Secondary | ICD-10-CM | POA: Diagnosis not present

## 2023-10-01 DIAGNOSIS — N186 End stage renal disease: Secondary | ICD-10-CM | POA: Diagnosis not present

## 2023-10-01 DIAGNOSIS — D631 Anemia in chronic kidney disease: Secondary | ICD-10-CM | POA: Diagnosis not present

## 2023-10-01 DIAGNOSIS — Z992 Dependence on renal dialysis: Secondary | ICD-10-CM | POA: Diagnosis not present

## 2023-10-01 DIAGNOSIS — N2581 Secondary hyperparathyroidism of renal origin: Secondary | ICD-10-CM | POA: Diagnosis not present

## 2023-10-01 DIAGNOSIS — Z23 Encounter for immunization: Secondary | ICD-10-CM | POA: Diagnosis not present

## 2023-10-01 DIAGNOSIS — E876 Hypokalemia: Secondary | ICD-10-CM | POA: Diagnosis not present

## 2023-10-03 DIAGNOSIS — D631 Anemia in chronic kidney disease: Secondary | ICD-10-CM | POA: Diagnosis not present

## 2023-10-03 DIAGNOSIS — D689 Coagulation defect, unspecified: Secondary | ICD-10-CM | POA: Diagnosis not present

## 2023-10-03 DIAGNOSIS — Z992 Dependence on renal dialysis: Secondary | ICD-10-CM | POA: Diagnosis not present

## 2023-10-03 DIAGNOSIS — N2581 Secondary hyperparathyroidism of renal origin: Secondary | ICD-10-CM | POA: Diagnosis not present

## 2023-10-03 DIAGNOSIS — Z23 Encounter for immunization: Secondary | ICD-10-CM | POA: Diagnosis not present

## 2023-10-03 DIAGNOSIS — N186 End stage renal disease: Secondary | ICD-10-CM | POA: Diagnosis not present

## 2023-10-03 DIAGNOSIS — E876 Hypokalemia: Secondary | ICD-10-CM | POA: Diagnosis not present

## 2023-10-03 DIAGNOSIS — T7840XD Allergy, unspecified, subsequent encounter: Secondary | ICD-10-CM | POA: Diagnosis not present

## 2023-10-05 DIAGNOSIS — Z992 Dependence on renal dialysis: Secondary | ICD-10-CM | POA: Diagnosis not present

## 2023-10-05 DIAGNOSIS — N2581 Secondary hyperparathyroidism of renal origin: Secondary | ICD-10-CM | POA: Diagnosis not present

## 2023-10-05 DIAGNOSIS — E876 Hypokalemia: Secondary | ICD-10-CM | POA: Diagnosis not present

## 2023-10-05 DIAGNOSIS — T7840XD Allergy, unspecified, subsequent encounter: Secondary | ICD-10-CM | POA: Diagnosis not present

## 2023-10-05 DIAGNOSIS — D689 Coagulation defect, unspecified: Secondary | ICD-10-CM | POA: Diagnosis not present

## 2023-10-05 DIAGNOSIS — N186 End stage renal disease: Secondary | ICD-10-CM | POA: Diagnosis not present

## 2023-10-05 DIAGNOSIS — Z23 Encounter for immunization: Secondary | ICD-10-CM | POA: Diagnosis not present

## 2023-10-05 DIAGNOSIS — D631 Anemia in chronic kidney disease: Secondary | ICD-10-CM | POA: Diagnosis not present

## 2023-10-08 DIAGNOSIS — Z23 Encounter for immunization: Secondary | ICD-10-CM | POA: Diagnosis not present

## 2023-10-08 DIAGNOSIS — N186 End stage renal disease: Secondary | ICD-10-CM | POA: Diagnosis not present

## 2023-10-08 DIAGNOSIS — E876 Hypokalemia: Secondary | ICD-10-CM | POA: Diagnosis not present

## 2023-10-08 DIAGNOSIS — D631 Anemia in chronic kidney disease: Secondary | ICD-10-CM | POA: Diagnosis not present

## 2023-10-08 DIAGNOSIS — T7840XD Allergy, unspecified, subsequent encounter: Secondary | ICD-10-CM | POA: Diagnosis not present

## 2023-10-08 DIAGNOSIS — Z992 Dependence on renal dialysis: Secondary | ICD-10-CM | POA: Diagnosis not present

## 2023-10-08 DIAGNOSIS — D689 Coagulation defect, unspecified: Secondary | ICD-10-CM | POA: Diagnosis not present

## 2023-10-08 DIAGNOSIS — N2581 Secondary hyperparathyroidism of renal origin: Secondary | ICD-10-CM | POA: Diagnosis not present

## 2023-10-10 DIAGNOSIS — Z23 Encounter for immunization: Secondary | ICD-10-CM | POA: Diagnosis not present

## 2023-10-10 DIAGNOSIS — Z992 Dependence on renal dialysis: Secondary | ICD-10-CM | POA: Diagnosis not present

## 2023-10-10 DIAGNOSIS — D689 Coagulation defect, unspecified: Secondary | ICD-10-CM | POA: Diagnosis not present

## 2023-10-10 DIAGNOSIS — T7840XD Allergy, unspecified, subsequent encounter: Secondary | ICD-10-CM | POA: Diagnosis not present

## 2023-10-10 DIAGNOSIS — E876 Hypokalemia: Secondary | ICD-10-CM | POA: Diagnosis not present

## 2023-10-10 DIAGNOSIS — N186 End stage renal disease: Secondary | ICD-10-CM | POA: Diagnosis not present

## 2023-10-10 DIAGNOSIS — N2581 Secondary hyperparathyroidism of renal origin: Secondary | ICD-10-CM | POA: Diagnosis not present

## 2023-10-10 DIAGNOSIS — D631 Anemia in chronic kidney disease: Secondary | ICD-10-CM | POA: Diagnosis not present

## 2023-10-12 DIAGNOSIS — T7840XD Allergy, unspecified, subsequent encounter: Secondary | ICD-10-CM | POA: Diagnosis not present

## 2023-10-12 DIAGNOSIS — D631 Anemia in chronic kidney disease: Secondary | ICD-10-CM | POA: Diagnosis not present

## 2023-10-12 DIAGNOSIS — Z23 Encounter for immunization: Secondary | ICD-10-CM | POA: Diagnosis not present

## 2023-10-12 DIAGNOSIS — N2581 Secondary hyperparathyroidism of renal origin: Secondary | ICD-10-CM | POA: Diagnosis not present

## 2023-10-12 DIAGNOSIS — E876 Hypokalemia: Secondary | ICD-10-CM | POA: Diagnosis not present

## 2023-10-12 DIAGNOSIS — D689 Coagulation defect, unspecified: Secondary | ICD-10-CM | POA: Diagnosis not present

## 2023-10-12 DIAGNOSIS — Z992 Dependence on renal dialysis: Secondary | ICD-10-CM | POA: Diagnosis not present

## 2023-10-12 DIAGNOSIS — N186 End stage renal disease: Secondary | ICD-10-CM | POA: Diagnosis not present

## 2023-10-15 DIAGNOSIS — T7840XD Allergy, unspecified, subsequent encounter: Secondary | ICD-10-CM | POA: Diagnosis not present

## 2023-10-15 DIAGNOSIS — D689 Coagulation defect, unspecified: Secondary | ICD-10-CM | POA: Diagnosis not present

## 2023-10-15 DIAGNOSIS — N186 End stage renal disease: Secondary | ICD-10-CM | POA: Diagnosis not present

## 2023-10-15 DIAGNOSIS — E876 Hypokalemia: Secondary | ICD-10-CM | POA: Diagnosis not present

## 2023-10-15 DIAGNOSIS — Z23 Encounter for immunization: Secondary | ICD-10-CM | POA: Diagnosis not present

## 2023-10-15 DIAGNOSIS — N2581 Secondary hyperparathyroidism of renal origin: Secondary | ICD-10-CM | POA: Diagnosis not present

## 2023-10-15 DIAGNOSIS — D631 Anemia in chronic kidney disease: Secondary | ICD-10-CM | POA: Diagnosis not present

## 2023-10-15 DIAGNOSIS — Z992 Dependence on renal dialysis: Secondary | ICD-10-CM | POA: Diagnosis not present

## 2023-10-17 DIAGNOSIS — D689 Coagulation defect, unspecified: Secondary | ICD-10-CM | POA: Diagnosis not present

## 2023-10-17 DIAGNOSIS — T7840XD Allergy, unspecified, subsequent encounter: Secondary | ICD-10-CM | POA: Diagnosis not present

## 2023-10-17 DIAGNOSIS — Z992 Dependence on renal dialysis: Secondary | ICD-10-CM | POA: Diagnosis not present

## 2023-10-17 DIAGNOSIS — Z23 Encounter for immunization: Secondary | ICD-10-CM | POA: Diagnosis not present

## 2023-10-17 DIAGNOSIS — N2581 Secondary hyperparathyroidism of renal origin: Secondary | ICD-10-CM | POA: Diagnosis not present

## 2023-10-17 DIAGNOSIS — D631 Anemia in chronic kidney disease: Secondary | ICD-10-CM | POA: Diagnosis not present

## 2023-10-17 DIAGNOSIS — N186 End stage renal disease: Secondary | ICD-10-CM | POA: Diagnosis not present

## 2023-10-17 DIAGNOSIS — E876 Hypokalemia: Secondary | ICD-10-CM | POA: Diagnosis not present

## 2023-10-19 DIAGNOSIS — E876 Hypokalemia: Secondary | ICD-10-CM | POA: Diagnosis not present

## 2023-10-19 DIAGNOSIS — D689 Coagulation defect, unspecified: Secondary | ICD-10-CM | POA: Diagnosis not present

## 2023-10-19 DIAGNOSIS — Z992 Dependence on renal dialysis: Secondary | ICD-10-CM | POA: Diagnosis not present

## 2023-10-19 DIAGNOSIS — T7840XD Allergy, unspecified, subsequent encounter: Secondary | ICD-10-CM | POA: Diagnosis not present

## 2023-10-19 DIAGNOSIS — N2581 Secondary hyperparathyroidism of renal origin: Secondary | ICD-10-CM | POA: Diagnosis not present

## 2023-10-19 DIAGNOSIS — D631 Anemia in chronic kidney disease: Secondary | ICD-10-CM | POA: Diagnosis not present

## 2023-10-19 DIAGNOSIS — Z23 Encounter for immunization: Secondary | ICD-10-CM | POA: Diagnosis not present

## 2023-10-19 DIAGNOSIS — N186 End stage renal disease: Secondary | ICD-10-CM | POA: Diagnosis not present

## 2023-10-21 DIAGNOSIS — D689 Coagulation defect, unspecified: Secondary | ICD-10-CM | POA: Diagnosis not present

## 2023-10-21 DIAGNOSIS — E876 Hypokalemia: Secondary | ICD-10-CM | POA: Diagnosis not present

## 2023-10-21 DIAGNOSIS — D631 Anemia in chronic kidney disease: Secondary | ICD-10-CM | POA: Diagnosis not present

## 2023-10-21 DIAGNOSIS — Z992 Dependence on renal dialysis: Secondary | ICD-10-CM | POA: Diagnosis not present

## 2023-10-21 DIAGNOSIS — Z23 Encounter for immunization: Secondary | ICD-10-CM | POA: Diagnosis not present

## 2023-10-21 DIAGNOSIS — T7840XD Allergy, unspecified, subsequent encounter: Secondary | ICD-10-CM | POA: Diagnosis not present

## 2023-10-21 DIAGNOSIS — N186 End stage renal disease: Secondary | ICD-10-CM | POA: Diagnosis not present

## 2023-10-21 DIAGNOSIS — N2581 Secondary hyperparathyroidism of renal origin: Secondary | ICD-10-CM | POA: Diagnosis not present

## 2023-10-23 DIAGNOSIS — Z23 Encounter for immunization: Secondary | ICD-10-CM | POA: Diagnosis not present

## 2023-10-23 DIAGNOSIS — D689 Coagulation defect, unspecified: Secondary | ICD-10-CM | POA: Diagnosis not present

## 2023-10-23 DIAGNOSIS — D631 Anemia in chronic kidney disease: Secondary | ICD-10-CM | POA: Diagnosis not present

## 2023-10-23 DIAGNOSIS — I422 Other hypertrophic cardiomyopathy: Secondary | ICD-10-CM | POA: Diagnosis not present

## 2023-10-23 DIAGNOSIS — N186 End stage renal disease: Secondary | ICD-10-CM | POA: Diagnosis not present

## 2023-10-23 DIAGNOSIS — Z992 Dependence on renal dialysis: Secondary | ICD-10-CM | POA: Diagnosis not present

## 2023-10-23 DIAGNOSIS — N2581 Secondary hyperparathyroidism of renal origin: Secondary | ICD-10-CM | POA: Diagnosis not present

## 2023-10-23 DIAGNOSIS — E876 Hypokalemia: Secondary | ICD-10-CM | POA: Diagnosis not present

## 2023-10-23 DIAGNOSIS — Z01810 Encounter for preprocedural cardiovascular examination: Secondary | ICD-10-CM | POA: Diagnosis not present

## 2023-10-23 DIAGNOSIS — T7840XD Allergy, unspecified, subsequent encounter: Secondary | ICD-10-CM | POA: Diagnosis not present

## 2023-10-26 DIAGNOSIS — D689 Coagulation defect, unspecified: Secondary | ICD-10-CM | POA: Diagnosis not present

## 2023-10-26 DIAGNOSIS — D631 Anemia in chronic kidney disease: Secondary | ICD-10-CM | POA: Diagnosis not present

## 2023-10-26 DIAGNOSIS — Z23 Encounter for immunization: Secondary | ICD-10-CM | POA: Diagnosis not present

## 2023-10-26 DIAGNOSIS — N2581 Secondary hyperparathyroidism of renal origin: Secondary | ICD-10-CM | POA: Diagnosis not present

## 2023-10-26 DIAGNOSIS — T7840XD Allergy, unspecified, subsequent encounter: Secondary | ICD-10-CM | POA: Diagnosis not present

## 2023-10-26 DIAGNOSIS — N186 End stage renal disease: Secondary | ICD-10-CM | POA: Diagnosis not present

## 2023-10-26 DIAGNOSIS — Z992 Dependence on renal dialysis: Secondary | ICD-10-CM | POA: Diagnosis not present

## 2023-10-26 DIAGNOSIS — E876 Hypokalemia: Secondary | ICD-10-CM | POA: Diagnosis not present

## 2023-10-27 DIAGNOSIS — E1122 Type 2 diabetes mellitus with diabetic chronic kidney disease: Secondary | ICD-10-CM | POA: Diagnosis not present

## 2023-10-27 DIAGNOSIS — Z992 Dependence on renal dialysis: Secondary | ICD-10-CM | POA: Diagnosis not present

## 2023-10-27 DIAGNOSIS — N186 End stage renal disease: Secondary | ICD-10-CM | POA: Diagnosis not present

## 2023-10-29 DIAGNOSIS — D631 Anemia in chronic kidney disease: Secondary | ICD-10-CM | POA: Diagnosis not present

## 2023-10-29 DIAGNOSIS — Z992 Dependence on renal dialysis: Secondary | ICD-10-CM | POA: Diagnosis not present

## 2023-10-29 DIAGNOSIS — D689 Coagulation defect, unspecified: Secondary | ICD-10-CM | POA: Diagnosis not present

## 2023-10-29 DIAGNOSIS — N186 End stage renal disease: Secondary | ICD-10-CM | POA: Diagnosis not present

## 2023-10-29 DIAGNOSIS — E876 Hypokalemia: Secondary | ICD-10-CM | POA: Diagnosis not present

## 2023-10-29 DIAGNOSIS — T7840XD Allergy, unspecified, subsequent encounter: Secondary | ICD-10-CM | POA: Diagnosis not present

## 2023-10-29 DIAGNOSIS — N2581 Secondary hyperparathyroidism of renal origin: Secondary | ICD-10-CM | POA: Diagnosis not present

## 2023-10-31 DIAGNOSIS — E876 Hypokalemia: Secondary | ICD-10-CM | POA: Diagnosis not present

## 2023-10-31 DIAGNOSIS — N186 End stage renal disease: Secondary | ICD-10-CM | POA: Diagnosis not present

## 2023-10-31 DIAGNOSIS — D631 Anemia in chronic kidney disease: Secondary | ICD-10-CM | POA: Diagnosis not present

## 2023-10-31 DIAGNOSIS — N2581 Secondary hyperparathyroidism of renal origin: Secondary | ICD-10-CM | POA: Diagnosis not present

## 2023-10-31 DIAGNOSIS — T7840XD Allergy, unspecified, subsequent encounter: Secondary | ICD-10-CM | POA: Diagnosis not present

## 2023-10-31 DIAGNOSIS — D689 Coagulation defect, unspecified: Secondary | ICD-10-CM | POA: Diagnosis not present

## 2023-10-31 DIAGNOSIS — Z992 Dependence on renal dialysis: Secondary | ICD-10-CM | POA: Diagnosis not present

## 2023-11-02 DIAGNOSIS — D631 Anemia in chronic kidney disease: Secondary | ICD-10-CM | POA: Diagnosis not present

## 2023-11-02 DIAGNOSIS — N186 End stage renal disease: Secondary | ICD-10-CM | POA: Diagnosis not present

## 2023-11-02 DIAGNOSIS — N2581 Secondary hyperparathyroidism of renal origin: Secondary | ICD-10-CM | POA: Diagnosis not present

## 2023-11-02 DIAGNOSIS — T7840XD Allergy, unspecified, subsequent encounter: Secondary | ICD-10-CM | POA: Diagnosis not present

## 2023-11-02 DIAGNOSIS — D689 Coagulation defect, unspecified: Secondary | ICD-10-CM | POA: Diagnosis not present

## 2023-11-02 DIAGNOSIS — E876 Hypokalemia: Secondary | ICD-10-CM | POA: Diagnosis not present

## 2023-11-02 DIAGNOSIS — Z992 Dependence on renal dialysis: Secondary | ICD-10-CM | POA: Diagnosis not present

## 2023-11-07 DIAGNOSIS — D631 Anemia in chronic kidney disease: Secondary | ICD-10-CM | POA: Diagnosis not present

## 2023-11-07 DIAGNOSIS — Z992 Dependence on renal dialysis: Secondary | ICD-10-CM | POA: Diagnosis not present

## 2023-11-07 DIAGNOSIS — N2581 Secondary hyperparathyroidism of renal origin: Secondary | ICD-10-CM | POA: Diagnosis not present

## 2023-11-07 DIAGNOSIS — T7840XD Allergy, unspecified, subsequent encounter: Secondary | ICD-10-CM | POA: Diagnosis not present

## 2023-11-07 DIAGNOSIS — N186 End stage renal disease: Secondary | ICD-10-CM | POA: Diagnosis not present

## 2023-11-07 DIAGNOSIS — E876 Hypokalemia: Secondary | ICD-10-CM | POA: Diagnosis not present

## 2023-11-07 DIAGNOSIS — D689 Coagulation defect, unspecified: Secondary | ICD-10-CM | POA: Diagnosis not present

## 2023-11-09 DIAGNOSIS — N2581 Secondary hyperparathyroidism of renal origin: Secondary | ICD-10-CM | POA: Diagnosis not present

## 2023-11-09 DIAGNOSIS — Z992 Dependence on renal dialysis: Secondary | ICD-10-CM | POA: Diagnosis not present

## 2023-11-09 DIAGNOSIS — T7840XD Allergy, unspecified, subsequent encounter: Secondary | ICD-10-CM | POA: Diagnosis not present

## 2023-11-09 DIAGNOSIS — D631 Anemia in chronic kidney disease: Secondary | ICD-10-CM | POA: Diagnosis not present

## 2023-11-09 DIAGNOSIS — D689 Coagulation defect, unspecified: Secondary | ICD-10-CM | POA: Diagnosis not present

## 2023-11-09 DIAGNOSIS — N186 End stage renal disease: Secondary | ICD-10-CM | POA: Diagnosis not present

## 2023-11-09 DIAGNOSIS — E876 Hypokalemia: Secondary | ICD-10-CM | POA: Diagnosis not present

## 2023-11-12 DIAGNOSIS — T7840XD Allergy, unspecified, subsequent encounter: Secondary | ICD-10-CM | POA: Diagnosis not present

## 2023-11-12 DIAGNOSIS — Z992 Dependence on renal dialysis: Secondary | ICD-10-CM | POA: Diagnosis not present

## 2023-11-12 DIAGNOSIS — D689 Coagulation defect, unspecified: Secondary | ICD-10-CM | POA: Diagnosis not present

## 2023-11-12 DIAGNOSIS — D631 Anemia in chronic kidney disease: Secondary | ICD-10-CM | POA: Diagnosis not present

## 2023-11-12 DIAGNOSIS — N2581 Secondary hyperparathyroidism of renal origin: Secondary | ICD-10-CM | POA: Diagnosis not present

## 2023-11-12 DIAGNOSIS — E876 Hypokalemia: Secondary | ICD-10-CM | POA: Diagnosis not present

## 2023-11-12 DIAGNOSIS — N186 End stage renal disease: Secondary | ICD-10-CM | POA: Diagnosis not present

## 2023-11-14 DIAGNOSIS — Z992 Dependence on renal dialysis: Secondary | ICD-10-CM | POA: Diagnosis not present

## 2023-11-14 DIAGNOSIS — E876 Hypokalemia: Secondary | ICD-10-CM | POA: Diagnosis not present

## 2023-11-14 DIAGNOSIS — D631 Anemia in chronic kidney disease: Secondary | ICD-10-CM | POA: Diagnosis not present

## 2023-11-14 DIAGNOSIS — N2581 Secondary hyperparathyroidism of renal origin: Secondary | ICD-10-CM | POA: Diagnosis not present

## 2023-11-14 DIAGNOSIS — T7840XD Allergy, unspecified, subsequent encounter: Secondary | ICD-10-CM | POA: Diagnosis not present

## 2023-11-14 DIAGNOSIS — N186 End stage renal disease: Secondary | ICD-10-CM | POA: Diagnosis not present

## 2023-11-14 DIAGNOSIS — D689 Coagulation defect, unspecified: Secondary | ICD-10-CM | POA: Diagnosis not present

## 2023-11-16 DIAGNOSIS — T7840XD Allergy, unspecified, subsequent encounter: Secondary | ICD-10-CM | POA: Diagnosis not present

## 2023-11-16 DIAGNOSIS — D631 Anemia in chronic kidney disease: Secondary | ICD-10-CM | POA: Diagnosis not present

## 2023-11-16 DIAGNOSIS — Z992 Dependence on renal dialysis: Secondary | ICD-10-CM | POA: Diagnosis not present

## 2023-11-16 DIAGNOSIS — E876 Hypokalemia: Secondary | ICD-10-CM | POA: Diagnosis not present

## 2023-11-16 DIAGNOSIS — D689 Coagulation defect, unspecified: Secondary | ICD-10-CM | POA: Diagnosis not present

## 2023-11-16 DIAGNOSIS — N186 End stage renal disease: Secondary | ICD-10-CM | POA: Diagnosis not present

## 2023-11-16 DIAGNOSIS — N2581 Secondary hyperparathyroidism of renal origin: Secondary | ICD-10-CM | POA: Diagnosis not present

## 2023-11-18 DIAGNOSIS — D689 Coagulation defect, unspecified: Secondary | ICD-10-CM | POA: Diagnosis not present

## 2023-11-18 DIAGNOSIS — Z992 Dependence on renal dialysis: Secondary | ICD-10-CM | POA: Diagnosis not present

## 2023-11-18 DIAGNOSIS — D631 Anemia in chronic kidney disease: Secondary | ICD-10-CM | POA: Diagnosis not present

## 2023-11-18 DIAGNOSIS — N2581 Secondary hyperparathyroidism of renal origin: Secondary | ICD-10-CM | POA: Diagnosis not present

## 2023-11-18 DIAGNOSIS — T7840XD Allergy, unspecified, subsequent encounter: Secondary | ICD-10-CM | POA: Diagnosis not present

## 2023-11-18 DIAGNOSIS — Z7682 Awaiting organ transplant status: Secondary | ICD-10-CM | POA: Diagnosis not present

## 2023-11-18 DIAGNOSIS — N186 End stage renal disease: Secondary | ICD-10-CM | POA: Diagnosis not present

## 2023-11-18 DIAGNOSIS — E876 Hypokalemia: Secondary | ICD-10-CM | POA: Diagnosis not present

## 2023-11-20 DIAGNOSIS — N2581 Secondary hyperparathyroidism of renal origin: Secondary | ICD-10-CM | POA: Diagnosis not present

## 2023-11-20 DIAGNOSIS — D631 Anemia in chronic kidney disease: Secondary | ICD-10-CM | POA: Diagnosis not present

## 2023-11-20 DIAGNOSIS — T7840XD Allergy, unspecified, subsequent encounter: Secondary | ICD-10-CM | POA: Diagnosis not present

## 2023-11-20 DIAGNOSIS — D689 Coagulation defect, unspecified: Secondary | ICD-10-CM | POA: Diagnosis not present

## 2023-11-20 DIAGNOSIS — E876 Hypokalemia: Secondary | ICD-10-CM | POA: Diagnosis not present

## 2023-11-20 DIAGNOSIS — N186 End stage renal disease: Secondary | ICD-10-CM | POA: Diagnosis not present

## 2023-11-20 DIAGNOSIS — Z992 Dependence on renal dialysis: Secondary | ICD-10-CM | POA: Diagnosis not present

## 2023-11-23 DIAGNOSIS — D689 Coagulation defect, unspecified: Secondary | ICD-10-CM | POA: Diagnosis not present

## 2023-11-23 DIAGNOSIS — E876 Hypokalemia: Secondary | ICD-10-CM | POA: Diagnosis not present

## 2023-11-23 DIAGNOSIS — D631 Anemia in chronic kidney disease: Secondary | ICD-10-CM | POA: Diagnosis not present

## 2023-11-23 DIAGNOSIS — T7840XD Allergy, unspecified, subsequent encounter: Secondary | ICD-10-CM | POA: Diagnosis not present

## 2023-11-23 DIAGNOSIS — N2581 Secondary hyperparathyroidism of renal origin: Secondary | ICD-10-CM | POA: Diagnosis not present

## 2023-11-23 DIAGNOSIS — N186 End stage renal disease: Secondary | ICD-10-CM | POA: Diagnosis not present

## 2023-11-23 DIAGNOSIS — Z992 Dependence on renal dialysis: Secondary | ICD-10-CM | POA: Diagnosis not present

## 2023-11-25 DIAGNOSIS — D689 Coagulation defect, unspecified: Secondary | ICD-10-CM | POA: Diagnosis not present

## 2023-11-25 DIAGNOSIS — N186 End stage renal disease: Secondary | ICD-10-CM | POA: Diagnosis not present

## 2023-11-25 DIAGNOSIS — Z992 Dependence on renal dialysis: Secondary | ICD-10-CM | POA: Diagnosis not present

## 2023-11-25 DIAGNOSIS — N2581 Secondary hyperparathyroidism of renal origin: Secondary | ICD-10-CM | POA: Diagnosis not present

## 2023-11-25 DIAGNOSIS — D631 Anemia in chronic kidney disease: Secondary | ICD-10-CM | POA: Diagnosis not present

## 2023-11-25 DIAGNOSIS — E876 Hypokalemia: Secondary | ICD-10-CM | POA: Diagnosis not present

## 2023-11-25 DIAGNOSIS — T7840XD Allergy, unspecified, subsequent encounter: Secondary | ICD-10-CM | POA: Diagnosis not present

## 2023-11-27 DIAGNOSIS — E876 Hypokalemia: Secondary | ICD-10-CM | POA: Diagnosis not present

## 2023-11-27 DIAGNOSIS — E1122 Type 2 diabetes mellitus with diabetic chronic kidney disease: Secondary | ICD-10-CM | POA: Diagnosis not present

## 2023-11-27 DIAGNOSIS — N2581 Secondary hyperparathyroidism of renal origin: Secondary | ICD-10-CM | POA: Diagnosis not present

## 2023-11-27 DIAGNOSIS — D631 Anemia in chronic kidney disease: Secondary | ICD-10-CM | POA: Diagnosis not present

## 2023-11-27 DIAGNOSIS — N186 End stage renal disease: Secondary | ICD-10-CM | POA: Diagnosis not present

## 2023-11-27 DIAGNOSIS — D689 Coagulation defect, unspecified: Secondary | ICD-10-CM | POA: Diagnosis not present

## 2023-11-27 DIAGNOSIS — T7840XD Allergy, unspecified, subsequent encounter: Secondary | ICD-10-CM | POA: Diagnosis not present

## 2023-11-27 DIAGNOSIS — Z992 Dependence on renal dialysis: Secondary | ICD-10-CM | POA: Diagnosis not present

## 2023-11-30 DIAGNOSIS — D631 Anemia in chronic kidney disease: Secondary | ICD-10-CM | POA: Diagnosis not present

## 2023-11-30 DIAGNOSIS — D689 Coagulation defect, unspecified: Secondary | ICD-10-CM | POA: Diagnosis not present

## 2023-11-30 DIAGNOSIS — N186 End stage renal disease: Secondary | ICD-10-CM | POA: Diagnosis not present

## 2023-11-30 DIAGNOSIS — Z992 Dependence on renal dialysis: Secondary | ICD-10-CM | POA: Diagnosis not present

## 2023-11-30 DIAGNOSIS — E876 Hypokalemia: Secondary | ICD-10-CM | POA: Diagnosis not present

## 2023-11-30 DIAGNOSIS — N2581 Secondary hyperparathyroidism of renal origin: Secondary | ICD-10-CM | POA: Diagnosis not present

## 2023-11-30 DIAGNOSIS — T7840XD Allergy, unspecified, subsequent encounter: Secondary | ICD-10-CM | POA: Diagnosis not present

## 2023-12-03 DIAGNOSIS — Z992 Dependence on renal dialysis: Secondary | ICD-10-CM | POA: Diagnosis not present

## 2023-12-03 DIAGNOSIS — N2581 Secondary hyperparathyroidism of renal origin: Secondary | ICD-10-CM | POA: Diagnosis not present

## 2023-12-03 DIAGNOSIS — N186 End stage renal disease: Secondary | ICD-10-CM | POA: Diagnosis not present

## 2023-12-03 DIAGNOSIS — D689 Coagulation defect, unspecified: Secondary | ICD-10-CM | POA: Diagnosis not present

## 2023-12-03 DIAGNOSIS — E876 Hypokalemia: Secondary | ICD-10-CM | POA: Diagnosis not present

## 2023-12-03 DIAGNOSIS — T7840XD Allergy, unspecified, subsequent encounter: Secondary | ICD-10-CM | POA: Diagnosis not present

## 2023-12-03 DIAGNOSIS — D631 Anemia in chronic kidney disease: Secondary | ICD-10-CM | POA: Diagnosis not present

## 2023-12-05 DIAGNOSIS — N186 End stage renal disease: Secondary | ICD-10-CM | POA: Diagnosis not present

## 2023-12-05 DIAGNOSIS — D689 Coagulation defect, unspecified: Secondary | ICD-10-CM | POA: Diagnosis not present

## 2023-12-05 DIAGNOSIS — E876 Hypokalemia: Secondary | ICD-10-CM | POA: Diagnosis not present

## 2023-12-05 DIAGNOSIS — Z992 Dependence on renal dialysis: Secondary | ICD-10-CM | POA: Diagnosis not present

## 2023-12-05 DIAGNOSIS — D631 Anemia in chronic kidney disease: Secondary | ICD-10-CM | POA: Diagnosis not present

## 2023-12-05 DIAGNOSIS — N2581 Secondary hyperparathyroidism of renal origin: Secondary | ICD-10-CM | POA: Diagnosis not present

## 2023-12-05 DIAGNOSIS — T7840XD Allergy, unspecified, subsequent encounter: Secondary | ICD-10-CM | POA: Diagnosis not present

## 2023-12-07 DIAGNOSIS — N2581 Secondary hyperparathyroidism of renal origin: Secondary | ICD-10-CM | POA: Diagnosis not present

## 2023-12-07 DIAGNOSIS — D689 Coagulation defect, unspecified: Secondary | ICD-10-CM | POA: Diagnosis not present

## 2023-12-07 DIAGNOSIS — T7840XD Allergy, unspecified, subsequent encounter: Secondary | ICD-10-CM | POA: Diagnosis not present

## 2023-12-07 DIAGNOSIS — E876 Hypokalemia: Secondary | ICD-10-CM | POA: Diagnosis not present

## 2023-12-07 DIAGNOSIS — N186 End stage renal disease: Secondary | ICD-10-CM | POA: Diagnosis not present

## 2023-12-07 DIAGNOSIS — D631 Anemia in chronic kidney disease: Secondary | ICD-10-CM | POA: Diagnosis not present

## 2023-12-07 DIAGNOSIS — Z992 Dependence on renal dialysis: Secondary | ICD-10-CM | POA: Diagnosis not present

## 2023-12-10 DIAGNOSIS — D689 Coagulation defect, unspecified: Secondary | ICD-10-CM | POA: Diagnosis not present

## 2023-12-10 DIAGNOSIS — Z992 Dependence on renal dialysis: Secondary | ICD-10-CM | POA: Diagnosis not present

## 2023-12-10 DIAGNOSIS — D631 Anemia in chronic kidney disease: Secondary | ICD-10-CM | POA: Diagnosis not present

## 2023-12-10 DIAGNOSIS — E876 Hypokalemia: Secondary | ICD-10-CM | POA: Diagnosis not present

## 2023-12-10 DIAGNOSIS — N186 End stage renal disease: Secondary | ICD-10-CM | POA: Diagnosis not present

## 2023-12-10 DIAGNOSIS — N2581 Secondary hyperparathyroidism of renal origin: Secondary | ICD-10-CM | POA: Diagnosis not present

## 2023-12-10 DIAGNOSIS — T7840XD Allergy, unspecified, subsequent encounter: Secondary | ICD-10-CM | POA: Diagnosis not present

## 2023-12-12 DIAGNOSIS — D689 Coagulation defect, unspecified: Secondary | ICD-10-CM | POA: Diagnosis not present

## 2023-12-12 DIAGNOSIS — N186 End stage renal disease: Secondary | ICD-10-CM | POA: Diagnosis not present

## 2023-12-12 DIAGNOSIS — N2581 Secondary hyperparathyroidism of renal origin: Secondary | ICD-10-CM | POA: Diagnosis not present

## 2023-12-12 DIAGNOSIS — E876 Hypokalemia: Secondary | ICD-10-CM | POA: Diagnosis not present

## 2023-12-12 DIAGNOSIS — Z992 Dependence on renal dialysis: Secondary | ICD-10-CM | POA: Diagnosis not present

## 2023-12-12 DIAGNOSIS — D631 Anemia in chronic kidney disease: Secondary | ICD-10-CM | POA: Diagnosis not present

## 2023-12-12 DIAGNOSIS — T7840XD Allergy, unspecified, subsequent encounter: Secondary | ICD-10-CM | POA: Diagnosis not present

## 2023-12-14 DIAGNOSIS — Z992 Dependence on renal dialysis: Secondary | ICD-10-CM | POA: Diagnosis not present

## 2023-12-14 DIAGNOSIS — E876 Hypokalemia: Secondary | ICD-10-CM | POA: Diagnosis not present

## 2023-12-14 DIAGNOSIS — D689 Coagulation defect, unspecified: Secondary | ICD-10-CM | POA: Diagnosis not present

## 2023-12-14 DIAGNOSIS — D631 Anemia in chronic kidney disease: Secondary | ICD-10-CM | POA: Diagnosis not present

## 2023-12-14 DIAGNOSIS — N186 End stage renal disease: Secondary | ICD-10-CM | POA: Diagnosis not present

## 2023-12-14 DIAGNOSIS — T7840XD Allergy, unspecified, subsequent encounter: Secondary | ICD-10-CM | POA: Diagnosis not present

## 2023-12-14 DIAGNOSIS — N2581 Secondary hyperparathyroidism of renal origin: Secondary | ICD-10-CM | POA: Diagnosis not present

## 2023-12-17 DIAGNOSIS — E876 Hypokalemia: Secondary | ICD-10-CM | POA: Diagnosis not present

## 2023-12-17 DIAGNOSIS — D631 Anemia in chronic kidney disease: Secondary | ICD-10-CM | POA: Diagnosis not present

## 2023-12-17 DIAGNOSIS — D689 Coagulation defect, unspecified: Secondary | ICD-10-CM | POA: Diagnosis not present

## 2023-12-17 DIAGNOSIS — T7840XD Allergy, unspecified, subsequent encounter: Secondary | ICD-10-CM | POA: Diagnosis not present

## 2023-12-17 DIAGNOSIS — N2581 Secondary hyperparathyroidism of renal origin: Secondary | ICD-10-CM | POA: Diagnosis not present

## 2023-12-17 DIAGNOSIS — Z992 Dependence on renal dialysis: Secondary | ICD-10-CM | POA: Diagnosis not present

## 2023-12-17 DIAGNOSIS — N186 End stage renal disease: Secondary | ICD-10-CM | POA: Diagnosis not present

## 2023-12-19 DIAGNOSIS — D689 Coagulation defect, unspecified: Secondary | ICD-10-CM | POA: Diagnosis not present

## 2023-12-19 DIAGNOSIS — D631 Anemia in chronic kidney disease: Secondary | ICD-10-CM | POA: Diagnosis not present

## 2023-12-19 DIAGNOSIS — Z992 Dependence on renal dialysis: Secondary | ICD-10-CM | POA: Diagnosis not present

## 2023-12-19 DIAGNOSIS — N186 End stage renal disease: Secondary | ICD-10-CM | POA: Diagnosis not present

## 2023-12-19 DIAGNOSIS — T7840XD Allergy, unspecified, subsequent encounter: Secondary | ICD-10-CM | POA: Diagnosis not present

## 2023-12-19 DIAGNOSIS — E876 Hypokalemia: Secondary | ICD-10-CM | POA: Diagnosis not present

## 2023-12-19 DIAGNOSIS — N2581 Secondary hyperparathyroidism of renal origin: Secondary | ICD-10-CM | POA: Diagnosis not present

## 2023-12-21 DIAGNOSIS — N2581 Secondary hyperparathyroidism of renal origin: Secondary | ICD-10-CM | POA: Diagnosis not present

## 2023-12-21 DIAGNOSIS — Z992 Dependence on renal dialysis: Secondary | ICD-10-CM | POA: Diagnosis not present

## 2023-12-21 DIAGNOSIS — D631 Anemia in chronic kidney disease: Secondary | ICD-10-CM | POA: Diagnosis not present

## 2023-12-21 DIAGNOSIS — E876 Hypokalemia: Secondary | ICD-10-CM | POA: Diagnosis not present

## 2023-12-21 DIAGNOSIS — T7840XD Allergy, unspecified, subsequent encounter: Secondary | ICD-10-CM | POA: Diagnosis not present

## 2023-12-21 DIAGNOSIS — D689 Coagulation defect, unspecified: Secondary | ICD-10-CM | POA: Diagnosis not present

## 2023-12-21 DIAGNOSIS — N186 End stage renal disease: Secondary | ICD-10-CM | POA: Diagnosis not present

## 2023-12-24 DIAGNOSIS — D631 Anemia in chronic kidney disease: Secondary | ICD-10-CM | POA: Diagnosis not present

## 2023-12-24 DIAGNOSIS — N2581 Secondary hyperparathyroidism of renal origin: Secondary | ICD-10-CM | POA: Diagnosis not present

## 2023-12-24 DIAGNOSIS — Z992 Dependence on renal dialysis: Secondary | ICD-10-CM | POA: Diagnosis not present

## 2023-12-24 DIAGNOSIS — D689 Coagulation defect, unspecified: Secondary | ICD-10-CM | POA: Diagnosis not present

## 2023-12-24 DIAGNOSIS — E876 Hypokalemia: Secondary | ICD-10-CM | POA: Diagnosis not present

## 2023-12-24 DIAGNOSIS — N186 End stage renal disease: Secondary | ICD-10-CM | POA: Diagnosis not present

## 2023-12-24 DIAGNOSIS — T7840XD Allergy, unspecified, subsequent encounter: Secondary | ICD-10-CM | POA: Diagnosis not present

## 2023-12-26 DIAGNOSIS — Z992 Dependence on renal dialysis: Secondary | ICD-10-CM | POA: Diagnosis not present

## 2023-12-26 DIAGNOSIS — N2581 Secondary hyperparathyroidism of renal origin: Secondary | ICD-10-CM | POA: Diagnosis not present

## 2023-12-26 DIAGNOSIS — E876 Hypokalemia: Secondary | ICD-10-CM | POA: Diagnosis not present

## 2023-12-26 DIAGNOSIS — D631 Anemia in chronic kidney disease: Secondary | ICD-10-CM | POA: Diagnosis not present

## 2023-12-26 DIAGNOSIS — T7840XD Allergy, unspecified, subsequent encounter: Secondary | ICD-10-CM | POA: Diagnosis not present

## 2023-12-26 DIAGNOSIS — N186 End stage renal disease: Secondary | ICD-10-CM | POA: Diagnosis not present

## 2023-12-26 DIAGNOSIS — D689 Coagulation defect, unspecified: Secondary | ICD-10-CM | POA: Diagnosis not present

## 2023-12-28 DIAGNOSIS — D631 Anemia in chronic kidney disease: Secondary | ICD-10-CM | POA: Diagnosis not present

## 2023-12-28 DIAGNOSIS — N2581 Secondary hyperparathyroidism of renal origin: Secondary | ICD-10-CM | POA: Diagnosis not present

## 2023-12-28 DIAGNOSIS — E876 Hypokalemia: Secondary | ICD-10-CM | POA: Diagnosis not present

## 2023-12-28 DIAGNOSIS — N186 End stage renal disease: Secondary | ICD-10-CM | POA: Diagnosis not present

## 2023-12-28 DIAGNOSIS — Z992 Dependence on renal dialysis: Secondary | ICD-10-CM | POA: Diagnosis not present

## 2023-12-28 DIAGNOSIS — E1122 Type 2 diabetes mellitus with diabetic chronic kidney disease: Secondary | ICD-10-CM | POA: Diagnosis not present

## 2023-12-28 DIAGNOSIS — D689 Coagulation defect, unspecified: Secondary | ICD-10-CM | POA: Diagnosis not present

## 2023-12-28 DIAGNOSIS — T7840XD Allergy, unspecified, subsequent encounter: Secondary | ICD-10-CM | POA: Diagnosis not present

## 2023-12-31 DIAGNOSIS — D631 Anemia in chronic kidney disease: Secondary | ICD-10-CM | POA: Diagnosis not present

## 2023-12-31 DIAGNOSIS — E876 Hypokalemia: Secondary | ICD-10-CM | POA: Diagnosis not present

## 2023-12-31 DIAGNOSIS — N186 End stage renal disease: Secondary | ICD-10-CM | POA: Diagnosis not present

## 2023-12-31 DIAGNOSIS — T7840XD Allergy, unspecified, subsequent encounter: Secondary | ICD-10-CM | POA: Diagnosis not present

## 2023-12-31 DIAGNOSIS — Z992 Dependence on renal dialysis: Secondary | ICD-10-CM | POA: Diagnosis not present

## 2023-12-31 DIAGNOSIS — D689 Coagulation defect, unspecified: Secondary | ICD-10-CM | POA: Diagnosis not present

## 2023-12-31 DIAGNOSIS — N2581 Secondary hyperparathyroidism of renal origin: Secondary | ICD-10-CM | POA: Diagnosis not present

## 2024-01-02 DIAGNOSIS — N2581 Secondary hyperparathyroidism of renal origin: Secondary | ICD-10-CM | POA: Diagnosis not present

## 2024-01-02 DIAGNOSIS — E876 Hypokalemia: Secondary | ICD-10-CM | POA: Diagnosis not present

## 2024-01-02 DIAGNOSIS — D631 Anemia in chronic kidney disease: Secondary | ICD-10-CM | POA: Diagnosis not present

## 2024-01-02 DIAGNOSIS — Z992 Dependence on renal dialysis: Secondary | ICD-10-CM | POA: Diagnosis not present

## 2024-01-02 DIAGNOSIS — T7840XD Allergy, unspecified, subsequent encounter: Secondary | ICD-10-CM | POA: Diagnosis not present

## 2024-01-02 DIAGNOSIS — N186 End stage renal disease: Secondary | ICD-10-CM | POA: Diagnosis not present

## 2024-01-02 DIAGNOSIS — D689 Coagulation defect, unspecified: Secondary | ICD-10-CM | POA: Diagnosis not present

## 2024-01-04 DIAGNOSIS — Z992 Dependence on renal dialysis: Secondary | ICD-10-CM | POA: Diagnosis not present

## 2024-01-04 DIAGNOSIS — D689 Coagulation defect, unspecified: Secondary | ICD-10-CM | POA: Diagnosis not present

## 2024-01-04 DIAGNOSIS — E876 Hypokalemia: Secondary | ICD-10-CM | POA: Diagnosis not present

## 2024-01-04 DIAGNOSIS — T7840XD Allergy, unspecified, subsequent encounter: Secondary | ICD-10-CM | POA: Diagnosis not present

## 2024-01-04 DIAGNOSIS — D631 Anemia in chronic kidney disease: Secondary | ICD-10-CM | POA: Diagnosis not present

## 2024-01-04 DIAGNOSIS — N186 End stage renal disease: Secondary | ICD-10-CM | POA: Diagnosis not present

## 2024-01-04 DIAGNOSIS — N2581 Secondary hyperparathyroidism of renal origin: Secondary | ICD-10-CM | POA: Diagnosis not present

## 2024-01-09 DIAGNOSIS — N2581 Secondary hyperparathyroidism of renal origin: Secondary | ICD-10-CM | POA: Diagnosis not present

## 2024-01-09 DIAGNOSIS — Z992 Dependence on renal dialysis: Secondary | ICD-10-CM | POA: Diagnosis not present

## 2024-01-09 DIAGNOSIS — D631 Anemia in chronic kidney disease: Secondary | ICD-10-CM | POA: Diagnosis not present

## 2024-01-09 DIAGNOSIS — D689 Coagulation defect, unspecified: Secondary | ICD-10-CM | POA: Diagnosis not present

## 2024-01-09 DIAGNOSIS — E876 Hypokalemia: Secondary | ICD-10-CM | POA: Diagnosis not present

## 2024-01-09 DIAGNOSIS — N186 End stage renal disease: Secondary | ICD-10-CM | POA: Diagnosis not present

## 2024-01-09 DIAGNOSIS — T7840XD Allergy, unspecified, subsequent encounter: Secondary | ICD-10-CM | POA: Diagnosis not present

## 2024-01-11 DIAGNOSIS — E876 Hypokalemia: Secondary | ICD-10-CM | POA: Diagnosis not present

## 2024-01-11 DIAGNOSIS — N2581 Secondary hyperparathyroidism of renal origin: Secondary | ICD-10-CM | POA: Diagnosis not present

## 2024-01-11 DIAGNOSIS — D689 Coagulation defect, unspecified: Secondary | ICD-10-CM | POA: Diagnosis not present

## 2024-01-11 DIAGNOSIS — T7840XD Allergy, unspecified, subsequent encounter: Secondary | ICD-10-CM | POA: Diagnosis not present

## 2024-01-11 DIAGNOSIS — Z992 Dependence on renal dialysis: Secondary | ICD-10-CM | POA: Diagnosis not present

## 2024-01-11 DIAGNOSIS — D631 Anemia in chronic kidney disease: Secondary | ICD-10-CM | POA: Diagnosis not present

## 2024-01-11 DIAGNOSIS — N186 End stage renal disease: Secondary | ICD-10-CM | POA: Diagnosis not present

## 2024-01-14 DIAGNOSIS — Z992 Dependence on renal dialysis: Secondary | ICD-10-CM | POA: Diagnosis not present

## 2024-01-14 DIAGNOSIS — N2581 Secondary hyperparathyroidism of renal origin: Secondary | ICD-10-CM | POA: Diagnosis not present

## 2024-01-14 DIAGNOSIS — D631 Anemia in chronic kidney disease: Secondary | ICD-10-CM | POA: Diagnosis not present

## 2024-01-14 DIAGNOSIS — T7840XD Allergy, unspecified, subsequent encounter: Secondary | ICD-10-CM | POA: Diagnosis not present

## 2024-01-14 DIAGNOSIS — D689 Coagulation defect, unspecified: Secondary | ICD-10-CM | POA: Diagnosis not present

## 2024-01-14 DIAGNOSIS — E876 Hypokalemia: Secondary | ICD-10-CM | POA: Diagnosis not present

## 2024-01-14 DIAGNOSIS — N186 End stage renal disease: Secondary | ICD-10-CM | POA: Diagnosis not present

## 2024-01-16 DIAGNOSIS — D689 Coagulation defect, unspecified: Secondary | ICD-10-CM | POA: Diagnosis not present

## 2024-01-16 DIAGNOSIS — Z992 Dependence on renal dialysis: Secondary | ICD-10-CM | POA: Diagnosis not present

## 2024-01-16 DIAGNOSIS — T7840XD Allergy, unspecified, subsequent encounter: Secondary | ICD-10-CM | POA: Diagnosis not present

## 2024-01-16 DIAGNOSIS — N186 End stage renal disease: Secondary | ICD-10-CM | POA: Diagnosis not present

## 2024-01-16 DIAGNOSIS — D631 Anemia in chronic kidney disease: Secondary | ICD-10-CM | POA: Diagnosis not present

## 2024-01-16 DIAGNOSIS — E876 Hypokalemia: Secondary | ICD-10-CM | POA: Diagnosis not present

## 2024-01-16 DIAGNOSIS — N2581 Secondary hyperparathyroidism of renal origin: Secondary | ICD-10-CM | POA: Diagnosis not present

## 2024-01-18 DIAGNOSIS — N2581 Secondary hyperparathyroidism of renal origin: Secondary | ICD-10-CM | POA: Diagnosis not present

## 2024-01-18 DIAGNOSIS — D631 Anemia in chronic kidney disease: Secondary | ICD-10-CM | POA: Diagnosis not present

## 2024-01-18 DIAGNOSIS — E876 Hypokalemia: Secondary | ICD-10-CM | POA: Diagnosis not present

## 2024-01-18 DIAGNOSIS — D689 Coagulation defect, unspecified: Secondary | ICD-10-CM | POA: Diagnosis not present

## 2024-01-18 DIAGNOSIS — T7840XD Allergy, unspecified, subsequent encounter: Secondary | ICD-10-CM | POA: Diagnosis not present

## 2024-01-18 DIAGNOSIS — N186 End stage renal disease: Secondary | ICD-10-CM | POA: Diagnosis not present

## 2024-01-18 DIAGNOSIS — Z992 Dependence on renal dialysis: Secondary | ICD-10-CM | POA: Diagnosis not present

## 2024-01-21 DIAGNOSIS — T7840XD Allergy, unspecified, subsequent encounter: Secondary | ICD-10-CM | POA: Diagnosis not present

## 2024-01-21 DIAGNOSIS — N2581 Secondary hyperparathyroidism of renal origin: Secondary | ICD-10-CM | POA: Diagnosis not present

## 2024-01-21 DIAGNOSIS — Z992 Dependence on renal dialysis: Secondary | ICD-10-CM | POA: Diagnosis not present

## 2024-01-21 DIAGNOSIS — E876 Hypokalemia: Secondary | ICD-10-CM | POA: Diagnosis not present

## 2024-01-21 DIAGNOSIS — D689 Coagulation defect, unspecified: Secondary | ICD-10-CM | POA: Diagnosis not present

## 2024-01-21 DIAGNOSIS — N186 End stage renal disease: Secondary | ICD-10-CM | POA: Diagnosis not present

## 2024-01-21 DIAGNOSIS — D631 Anemia in chronic kidney disease: Secondary | ICD-10-CM | POA: Diagnosis not present

## 2024-01-23 DIAGNOSIS — N2581 Secondary hyperparathyroidism of renal origin: Secondary | ICD-10-CM | POA: Diagnosis not present

## 2024-01-23 DIAGNOSIS — E876 Hypokalemia: Secondary | ICD-10-CM | POA: Diagnosis not present

## 2024-01-23 DIAGNOSIS — D689 Coagulation defect, unspecified: Secondary | ICD-10-CM | POA: Diagnosis not present

## 2024-01-23 DIAGNOSIS — N186 End stage renal disease: Secondary | ICD-10-CM | POA: Diagnosis not present

## 2024-01-23 DIAGNOSIS — Z992 Dependence on renal dialysis: Secondary | ICD-10-CM | POA: Diagnosis not present

## 2024-01-23 DIAGNOSIS — T7840XD Allergy, unspecified, subsequent encounter: Secondary | ICD-10-CM | POA: Diagnosis not present

## 2024-01-23 DIAGNOSIS — D631 Anemia in chronic kidney disease: Secondary | ICD-10-CM | POA: Diagnosis not present

## 2024-01-25 DIAGNOSIS — N186 End stage renal disease: Secondary | ICD-10-CM | POA: Diagnosis not present

## 2024-01-25 DIAGNOSIS — E1122 Type 2 diabetes mellitus with diabetic chronic kidney disease: Secondary | ICD-10-CM | POA: Diagnosis not present

## 2024-01-25 DIAGNOSIS — Z992 Dependence on renal dialysis: Secondary | ICD-10-CM | POA: Diagnosis not present

## 2024-01-26 DIAGNOSIS — T7840XD Allergy, unspecified, subsequent encounter: Secondary | ICD-10-CM | POA: Diagnosis not present

## 2024-01-26 DIAGNOSIS — E876 Hypokalemia: Secondary | ICD-10-CM | POA: Diagnosis not present

## 2024-01-26 DIAGNOSIS — D689 Coagulation defect, unspecified: Secondary | ICD-10-CM | POA: Diagnosis not present

## 2024-01-26 DIAGNOSIS — N2581 Secondary hyperparathyroidism of renal origin: Secondary | ICD-10-CM | POA: Diagnosis not present

## 2024-01-26 DIAGNOSIS — Z992 Dependence on renal dialysis: Secondary | ICD-10-CM | POA: Diagnosis not present

## 2024-01-26 DIAGNOSIS — D631 Anemia in chronic kidney disease: Secondary | ICD-10-CM | POA: Diagnosis not present

## 2024-01-26 DIAGNOSIS — N186 End stage renal disease: Secondary | ICD-10-CM | POA: Diagnosis not present

## 2024-01-28 DIAGNOSIS — E876 Hypokalemia: Secondary | ICD-10-CM | POA: Diagnosis not present

## 2024-01-28 DIAGNOSIS — N2581 Secondary hyperparathyroidism of renal origin: Secondary | ICD-10-CM | POA: Diagnosis not present

## 2024-01-28 DIAGNOSIS — N186 End stage renal disease: Secondary | ICD-10-CM | POA: Diagnosis not present

## 2024-01-28 DIAGNOSIS — T7840XD Allergy, unspecified, subsequent encounter: Secondary | ICD-10-CM | POA: Diagnosis not present

## 2024-01-28 DIAGNOSIS — D689 Coagulation defect, unspecified: Secondary | ICD-10-CM | POA: Diagnosis not present

## 2024-01-28 DIAGNOSIS — D631 Anemia in chronic kidney disease: Secondary | ICD-10-CM | POA: Diagnosis not present

## 2024-01-28 DIAGNOSIS — Z992 Dependence on renal dialysis: Secondary | ICD-10-CM | POA: Diagnosis not present

## 2024-01-30 DIAGNOSIS — E876 Hypokalemia: Secondary | ICD-10-CM | POA: Diagnosis not present

## 2024-01-30 DIAGNOSIS — D689 Coagulation defect, unspecified: Secondary | ICD-10-CM | POA: Diagnosis not present

## 2024-01-30 DIAGNOSIS — T7840XD Allergy, unspecified, subsequent encounter: Secondary | ICD-10-CM | POA: Diagnosis not present

## 2024-01-30 DIAGNOSIS — D631 Anemia in chronic kidney disease: Secondary | ICD-10-CM | POA: Diagnosis not present

## 2024-01-30 DIAGNOSIS — Z992 Dependence on renal dialysis: Secondary | ICD-10-CM | POA: Diagnosis not present

## 2024-01-30 DIAGNOSIS — N186 End stage renal disease: Secondary | ICD-10-CM | POA: Diagnosis not present

## 2024-01-30 DIAGNOSIS — N2581 Secondary hyperparathyroidism of renal origin: Secondary | ICD-10-CM | POA: Diagnosis not present

## 2024-02-01 DIAGNOSIS — E876 Hypokalemia: Secondary | ICD-10-CM | POA: Diagnosis not present

## 2024-02-01 DIAGNOSIS — N186 End stage renal disease: Secondary | ICD-10-CM | POA: Diagnosis not present

## 2024-02-01 DIAGNOSIS — Z992 Dependence on renal dialysis: Secondary | ICD-10-CM | POA: Diagnosis not present

## 2024-02-01 DIAGNOSIS — D689 Coagulation defect, unspecified: Secondary | ICD-10-CM | POA: Diagnosis not present

## 2024-02-01 DIAGNOSIS — D631 Anemia in chronic kidney disease: Secondary | ICD-10-CM | POA: Diagnosis not present

## 2024-02-01 DIAGNOSIS — T7840XD Allergy, unspecified, subsequent encounter: Secondary | ICD-10-CM | POA: Diagnosis not present

## 2024-02-01 DIAGNOSIS — N2581 Secondary hyperparathyroidism of renal origin: Secondary | ICD-10-CM | POA: Diagnosis not present

## 2024-02-04 DIAGNOSIS — N2581 Secondary hyperparathyroidism of renal origin: Secondary | ICD-10-CM | POA: Diagnosis not present

## 2024-02-04 DIAGNOSIS — E876 Hypokalemia: Secondary | ICD-10-CM | POA: Diagnosis not present

## 2024-02-04 DIAGNOSIS — Z992 Dependence on renal dialysis: Secondary | ICD-10-CM | POA: Diagnosis not present

## 2024-02-04 DIAGNOSIS — D631 Anemia in chronic kidney disease: Secondary | ICD-10-CM | POA: Diagnosis not present

## 2024-02-04 DIAGNOSIS — N186 End stage renal disease: Secondary | ICD-10-CM | POA: Diagnosis not present

## 2024-02-04 DIAGNOSIS — T7840XD Allergy, unspecified, subsequent encounter: Secondary | ICD-10-CM | POA: Diagnosis not present

## 2024-02-04 DIAGNOSIS — D689 Coagulation defect, unspecified: Secondary | ICD-10-CM | POA: Diagnosis not present

## 2024-02-06 DIAGNOSIS — D631 Anemia in chronic kidney disease: Secondary | ICD-10-CM | POA: Diagnosis not present

## 2024-02-06 DIAGNOSIS — T7840XD Allergy, unspecified, subsequent encounter: Secondary | ICD-10-CM | POA: Diagnosis not present

## 2024-02-06 DIAGNOSIS — N2581 Secondary hyperparathyroidism of renal origin: Secondary | ICD-10-CM | POA: Diagnosis not present

## 2024-02-06 DIAGNOSIS — E876 Hypokalemia: Secondary | ICD-10-CM | POA: Diagnosis not present

## 2024-02-06 DIAGNOSIS — D689 Coagulation defect, unspecified: Secondary | ICD-10-CM | POA: Diagnosis not present

## 2024-02-06 DIAGNOSIS — Z992 Dependence on renal dialysis: Secondary | ICD-10-CM | POA: Diagnosis not present

## 2024-02-06 DIAGNOSIS — N186 End stage renal disease: Secondary | ICD-10-CM | POA: Diagnosis not present

## 2024-02-08 DIAGNOSIS — N2581 Secondary hyperparathyroidism of renal origin: Secondary | ICD-10-CM | POA: Diagnosis not present

## 2024-02-08 DIAGNOSIS — E876 Hypokalemia: Secondary | ICD-10-CM | POA: Diagnosis not present

## 2024-02-08 DIAGNOSIS — D631 Anemia in chronic kidney disease: Secondary | ICD-10-CM | POA: Diagnosis not present

## 2024-02-08 DIAGNOSIS — N186 End stage renal disease: Secondary | ICD-10-CM | POA: Diagnosis not present

## 2024-02-08 DIAGNOSIS — Z992 Dependence on renal dialysis: Secondary | ICD-10-CM | POA: Diagnosis not present

## 2024-02-08 DIAGNOSIS — T7840XD Allergy, unspecified, subsequent encounter: Secondary | ICD-10-CM | POA: Diagnosis not present

## 2024-02-08 DIAGNOSIS — D689 Coagulation defect, unspecified: Secondary | ICD-10-CM | POA: Diagnosis not present

## 2024-02-13 DIAGNOSIS — T7840XD Allergy, unspecified, subsequent encounter: Secondary | ICD-10-CM | POA: Diagnosis not present

## 2024-02-13 DIAGNOSIS — D631 Anemia in chronic kidney disease: Secondary | ICD-10-CM | POA: Diagnosis not present

## 2024-02-13 DIAGNOSIS — N2581 Secondary hyperparathyroidism of renal origin: Secondary | ICD-10-CM | POA: Diagnosis not present

## 2024-02-13 DIAGNOSIS — E876 Hypokalemia: Secondary | ICD-10-CM | POA: Diagnosis not present

## 2024-02-13 DIAGNOSIS — N186 End stage renal disease: Secondary | ICD-10-CM | POA: Diagnosis not present

## 2024-02-13 DIAGNOSIS — Z992 Dependence on renal dialysis: Secondary | ICD-10-CM | POA: Diagnosis not present

## 2024-02-13 DIAGNOSIS — D689 Coagulation defect, unspecified: Secondary | ICD-10-CM | POA: Diagnosis not present

## 2024-02-15 DIAGNOSIS — D689 Coagulation defect, unspecified: Secondary | ICD-10-CM | POA: Diagnosis not present

## 2024-02-15 DIAGNOSIS — E876 Hypokalemia: Secondary | ICD-10-CM | POA: Diagnosis not present

## 2024-02-15 DIAGNOSIS — Z992 Dependence on renal dialysis: Secondary | ICD-10-CM | POA: Diagnosis not present

## 2024-02-15 DIAGNOSIS — N186 End stage renal disease: Secondary | ICD-10-CM | POA: Diagnosis not present

## 2024-02-15 DIAGNOSIS — D631 Anemia in chronic kidney disease: Secondary | ICD-10-CM | POA: Diagnosis not present

## 2024-02-15 DIAGNOSIS — T7840XD Allergy, unspecified, subsequent encounter: Secondary | ICD-10-CM | POA: Diagnosis not present

## 2024-02-15 DIAGNOSIS — N2581 Secondary hyperparathyroidism of renal origin: Secondary | ICD-10-CM | POA: Diagnosis not present

## 2024-02-18 DIAGNOSIS — E876 Hypokalemia: Secondary | ICD-10-CM | POA: Diagnosis not present

## 2024-02-18 DIAGNOSIS — D631 Anemia in chronic kidney disease: Secondary | ICD-10-CM | POA: Diagnosis not present

## 2024-02-18 DIAGNOSIS — D689 Coagulation defect, unspecified: Secondary | ICD-10-CM | POA: Diagnosis not present

## 2024-02-18 DIAGNOSIS — N186 End stage renal disease: Secondary | ICD-10-CM | POA: Diagnosis not present

## 2024-02-18 DIAGNOSIS — Z992 Dependence on renal dialysis: Secondary | ICD-10-CM | POA: Diagnosis not present

## 2024-02-18 DIAGNOSIS — N2581 Secondary hyperparathyroidism of renal origin: Secondary | ICD-10-CM | POA: Diagnosis not present

## 2024-02-18 DIAGNOSIS — T7840XD Allergy, unspecified, subsequent encounter: Secondary | ICD-10-CM | POA: Diagnosis not present

## 2024-02-20 DIAGNOSIS — Z992 Dependence on renal dialysis: Secondary | ICD-10-CM | POA: Diagnosis not present

## 2024-02-20 DIAGNOSIS — T7840XD Allergy, unspecified, subsequent encounter: Secondary | ICD-10-CM | POA: Diagnosis not present

## 2024-02-20 DIAGNOSIS — D689 Coagulation defect, unspecified: Secondary | ICD-10-CM | POA: Diagnosis not present

## 2024-02-20 DIAGNOSIS — N2581 Secondary hyperparathyroidism of renal origin: Secondary | ICD-10-CM | POA: Diagnosis not present

## 2024-02-20 DIAGNOSIS — N186 End stage renal disease: Secondary | ICD-10-CM | POA: Diagnosis not present

## 2024-02-20 DIAGNOSIS — D631 Anemia in chronic kidney disease: Secondary | ICD-10-CM | POA: Diagnosis not present

## 2024-02-20 DIAGNOSIS — E876 Hypokalemia: Secondary | ICD-10-CM | POA: Diagnosis not present

## 2024-02-22 DIAGNOSIS — N186 End stage renal disease: Secondary | ICD-10-CM | POA: Diagnosis not present

## 2024-02-22 DIAGNOSIS — T7840XD Allergy, unspecified, subsequent encounter: Secondary | ICD-10-CM | POA: Diagnosis not present

## 2024-02-22 DIAGNOSIS — N2581 Secondary hyperparathyroidism of renal origin: Secondary | ICD-10-CM | POA: Diagnosis not present

## 2024-02-22 DIAGNOSIS — D631 Anemia in chronic kidney disease: Secondary | ICD-10-CM | POA: Diagnosis not present

## 2024-02-22 DIAGNOSIS — D689 Coagulation defect, unspecified: Secondary | ICD-10-CM | POA: Diagnosis not present

## 2024-02-22 DIAGNOSIS — Z992 Dependence on renal dialysis: Secondary | ICD-10-CM | POA: Diagnosis not present

## 2024-02-22 DIAGNOSIS — E876 Hypokalemia: Secondary | ICD-10-CM | POA: Diagnosis not present

## 2024-02-25 DIAGNOSIS — E1122 Type 2 diabetes mellitus with diabetic chronic kidney disease: Secondary | ICD-10-CM | POA: Diagnosis not present

## 2024-02-25 DIAGNOSIS — E876 Hypokalemia: Secondary | ICD-10-CM | POA: Diagnosis not present

## 2024-02-25 DIAGNOSIS — T7840XD Allergy, unspecified, subsequent encounter: Secondary | ICD-10-CM | POA: Diagnosis not present

## 2024-02-25 DIAGNOSIS — D689 Coagulation defect, unspecified: Secondary | ICD-10-CM | POA: Diagnosis not present

## 2024-02-25 DIAGNOSIS — N2581 Secondary hyperparathyroidism of renal origin: Secondary | ICD-10-CM | POA: Diagnosis not present

## 2024-02-25 DIAGNOSIS — Z992 Dependence on renal dialysis: Secondary | ICD-10-CM | POA: Diagnosis not present

## 2024-02-25 DIAGNOSIS — N186 End stage renal disease: Secondary | ICD-10-CM | POA: Diagnosis not present

## 2024-02-25 DIAGNOSIS — D631 Anemia in chronic kidney disease: Secondary | ICD-10-CM | POA: Diagnosis not present

## 2024-02-27 DIAGNOSIS — N2581 Secondary hyperparathyroidism of renal origin: Secondary | ICD-10-CM | POA: Diagnosis not present

## 2024-02-27 DIAGNOSIS — D631 Anemia in chronic kidney disease: Secondary | ICD-10-CM | POA: Diagnosis not present

## 2024-02-27 DIAGNOSIS — T7840XD Allergy, unspecified, subsequent encounter: Secondary | ICD-10-CM | POA: Diagnosis not present

## 2024-02-27 DIAGNOSIS — D689 Coagulation defect, unspecified: Secondary | ICD-10-CM | POA: Diagnosis not present

## 2024-02-27 DIAGNOSIS — E876 Hypokalemia: Secondary | ICD-10-CM | POA: Diagnosis not present

## 2024-02-27 DIAGNOSIS — N186 End stage renal disease: Secondary | ICD-10-CM | POA: Diagnosis not present

## 2024-02-27 DIAGNOSIS — Z992 Dependence on renal dialysis: Secondary | ICD-10-CM | POA: Diagnosis not present

## 2024-02-29 DIAGNOSIS — E876 Hypokalemia: Secondary | ICD-10-CM | POA: Diagnosis not present

## 2024-02-29 DIAGNOSIS — N186 End stage renal disease: Secondary | ICD-10-CM | POA: Diagnosis not present

## 2024-02-29 DIAGNOSIS — D689 Coagulation defect, unspecified: Secondary | ICD-10-CM | POA: Diagnosis not present

## 2024-02-29 DIAGNOSIS — D631 Anemia in chronic kidney disease: Secondary | ICD-10-CM | POA: Diagnosis not present

## 2024-02-29 DIAGNOSIS — N2581 Secondary hyperparathyroidism of renal origin: Secondary | ICD-10-CM | POA: Diagnosis not present

## 2024-02-29 DIAGNOSIS — T7840XD Allergy, unspecified, subsequent encounter: Secondary | ICD-10-CM | POA: Diagnosis not present

## 2024-02-29 DIAGNOSIS — Z992 Dependence on renal dialysis: Secondary | ICD-10-CM | POA: Diagnosis not present

## 2024-03-03 DIAGNOSIS — N2581 Secondary hyperparathyroidism of renal origin: Secondary | ICD-10-CM | POA: Diagnosis not present

## 2024-03-03 DIAGNOSIS — T7840XD Allergy, unspecified, subsequent encounter: Secondary | ICD-10-CM | POA: Diagnosis not present

## 2024-03-03 DIAGNOSIS — E876 Hypokalemia: Secondary | ICD-10-CM | POA: Diagnosis not present

## 2024-03-03 DIAGNOSIS — D631 Anemia in chronic kidney disease: Secondary | ICD-10-CM | POA: Diagnosis not present

## 2024-03-03 DIAGNOSIS — Z992 Dependence on renal dialysis: Secondary | ICD-10-CM | POA: Diagnosis not present

## 2024-03-03 DIAGNOSIS — D689 Coagulation defect, unspecified: Secondary | ICD-10-CM | POA: Diagnosis not present

## 2024-03-03 DIAGNOSIS — N186 End stage renal disease: Secondary | ICD-10-CM | POA: Diagnosis not present

## 2024-03-05 DIAGNOSIS — Z992 Dependence on renal dialysis: Secondary | ICD-10-CM | POA: Diagnosis not present

## 2024-03-05 DIAGNOSIS — E876 Hypokalemia: Secondary | ICD-10-CM | POA: Diagnosis not present

## 2024-03-05 DIAGNOSIS — D689 Coagulation defect, unspecified: Secondary | ICD-10-CM | POA: Diagnosis not present

## 2024-03-05 DIAGNOSIS — D631 Anemia in chronic kidney disease: Secondary | ICD-10-CM | POA: Diagnosis not present

## 2024-03-05 DIAGNOSIS — N186 End stage renal disease: Secondary | ICD-10-CM | POA: Diagnosis not present

## 2024-03-05 DIAGNOSIS — T7840XD Allergy, unspecified, subsequent encounter: Secondary | ICD-10-CM | POA: Diagnosis not present

## 2024-03-05 DIAGNOSIS — N2581 Secondary hyperparathyroidism of renal origin: Secondary | ICD-10-CM | POA: Diagnosis not present

## 2024-03-07 DIAGNOSIS — T7840XD Allergy, unspecified, subsequent encounter: Secondary | ICD-10-CM | POA: Diagnosis not present

## 2024-03-07 DIAGNOSIS — N186 End stage renal disease: Secondary | ICD-10-CM | POA: Diagnosis not present

## 2024-03-07 DIAGNOSIS — Z992 Dependence on renal dialysis: Secondary | ICD-10-CM | POA: Diagnosis not present

## 2024-03-07 DIAGNOSIS — D631 Anemia in chronic kidney disease: Secondary | ICD-10-CM | POA: Diagnosis not present

## 2024-03-07 DIAGNOSIS — E876 Hypokalemia: Secondary | ICD-10-CM | POA: Diagnosis not present

## 2024-03-07 DIAGNOSIS — D689 Coagulation defect, unspecified: Secondary | ICD-10-CM | POA: Diagnosis not present

## 2024-03-07 DIAGNOSIS — N2581 Secondary hyperparathyroidism of renal origin: Secondary | ICD-10-CM | POA: Diagnosis not present

## 2024-03-10 DIAGNOSIS — E876 Hypokalemia: Secondary | ICD-10-CM | POA: Diagnosis not present

## 2024-03-10 DIAGNOSIS — N186 End stage renal disease: Secondary | ICD-10-CM | POA: Diagnosis not present

## 2024-03-10 DIAGNOSIS — N2581 Secondary hyperparathyroidism of renal origin: Secondary | ICD-10-CM | POA: Diagnosis not present

## 2024-03-10 DIAGNOSIS — T7840XD Allergy, unspecified, subsequent encounter: Secondary | ICD-10-CM | POA: Diagnosis not present

## 2024-03-10 DIAGNOSIS — D689 Coagulation defect, unspecified: Secondary | ICD-10-CM | POA: Diagnosis not present

## 2024-03-10 DIAGNOSIS — Z992 Dependence on renal dialysis: Secondary | ICD-10-CM | POA: Diagnosis not present

## 2024-03-10 DIAGNOSIS — D631 Anemia in chronic kidney disease: Secondary | ICD-10-CM | POA: Diagnosis not present

## 2024-03-12 DIAGNOSIS — Z992 Dependence on renal dialysis: Secondary | ICD-10-CM | POA: Diagnosis not present

## 2024-03-12 DIAGNOSIS — N2581 Secondary hyperparathyroidism of renal origin: Secondary | ICD-10-CM | POA: Diagnosis not present

## 2024-03-12 DIAGNOSIS — D689 Coagulation defect, unspecified: Secondary | ICD-10-CM | POA: Diagnosis not present

## 2024-03-12 DIAGNOSIS — D631 Anemia in chronic kidney disease: Secondary | ICD-10-CM | POA: Diagnosis not present

## 2024-03-12 DIAGNOSIS — N186 End stage renal disease: Secondary | ICD-10-CM | POA: Diagnosis not present

## 2024-03-12 DIAGNOSIS — E876 Hypokalemia: Secondary | ICD-10-CM | POA: Diagnosis not present

## 2024-03-12 DIAGNOSIS — T7840XD Allergy, unspecified, subsequent encounter: Secondary | ICD-10-CM | POA: Diagnosis not present

## 2024-03-14 DIAGNOSIS — Z992 Dependence on renal dialysis: Secondary | ICD-10-CM | POA: Diagnosis not present

## 2024-03-14 DIAGNOSIS — N186 End stage renal disease: Secondary | ICD-10-CM | POA: Diagnosis not present

## 2024-03-14 DIAGNOSIS — D631 Anemia in chronic kidney disease: Secondary | ICD-10-CM | POA: Diagnosis not present

## 2024-03-14 DIAGNOSIS — E876 Hypokalemia: Secondary | ICD-10-CM | POA: Diagnosis not present

## 2024-03-14 DIAGNOSIS — N2581 Secondary hyperparathyroidism of renal origin: Secondary | ICD-10-CM | POA: Diagnosis not present

## 2024-03-14 DIAGNOSIS — T7840XD Allergy, unspecified, subsequent encounter: Secondary | ICD-10-CM | POA: Diagnosis not present

## 2024-03-14 DIAGNOSIS — D689 Coagulation defect, unspecified: Secondary | ICD-10-CM | POA: Diagnosis not present

## 2024-03-17 DIAGNOSIS — E876 Hypokalemia: Secondary | ICD-10-CM | POA: Diagnosis not present

## 2024-03-17 DIAGNOSIS — N2581 Secondary hyperparathyroidism of renal origin: Secondary | ICD-10-CM | POA: Diagnosis not present

## 2024-03-17 DIAGNOSIS — D689 Coagulation defect, unspecified: Secondary | ICD-10-CM | POA: Diagnosis not present

## 2024-03-17 DIAGNOSIS — N186 End stage renal disease: Secondary | ICD-10-CM | POA: Diagnosis not present

## 2024-03-17 DIAGNOSIS — Z992 Dependence on renal dialysis: Secondary | ICD-10-CM | POA: Diagnosis not present

## 2024-03-17 DIAGNOSIS — T7840XD Allergy, unspecified, subsequent encounter: Secondary | ICD-10-CM | POA: Diagnosis not present

## 2024-03-17 DIAGNOSIS — D631 Anemia in chronic kidney disease: Secondary | ICD-10-CM | POA: Diagnosis not present

## 2024-03-19 DIAGNOSIS — D689 Coagulation defect, unspecified: Secondary | ICD-10-CM | POA: Diagnosis not present

## 2024-03-19 DIAGNOSIS — Z992 Dependence on renal dialysis: Secondary | ICD-10-CM | POA: Diagnosis not present

## 2024-03-19 DIAGNOSIS — D631 Anemia in chronic kidney disease: Secondary | ICD-10-CM | POA: Diagnosis not present

## 2024-03-19 DIAGNOSIS — N2581 Secondary hyperparathyroidism of renal origin: Secondary | ICD-10-CM | POA: Diagnosis not present

## 2024-03-19 DIAGNOSIS — N186 End stage renal disease: Secondary | ICD-10-CM | POA: Diagnosis not present

## 2024-03-19 DIAGNOSIS — E876 Hypokalemia: Secondary | ICD-10-CM | POA: Diagnosis not present

## 2024-03-19 DIAGNOSIS — T7840XD Allergy, unspecified, subsequent encounter: Secondary | ICD-10-CM | POA: Diagnosis not present

## 2024-03-21 DIAGNOSIS — T7840XD Allergy, unspecified, subsequent encounter: Secondary | ICD-10-CM | POA: Diagnosis not present

## 2024-03-21 DIAGNOSIS — Z992 Dependence on renal dialysis: Secondary | ICD-10-CM | POA: Diagnosis not present

## 2024-03-21 DIAGNOSIS — E876 Hypokalemia: Secondary | ICD-10-CM | POA: Diagnosis not present

## 2024-03-21 DIAGNOSIS — N2581 Secondary hyperparathyroidism of renal origin: Secondary | ICD-10-CM | POA: Diagnosis not present

## 2024-03-21 DIAGNOSIS — N186 End stage renal disease: Secondary | ICD-10-CM | POA: Diagnosis not present

## 2024-03-21 DIAGNOSIS — D689 Coagulation defect, unspecified: Secondary | ICD-10-CM | POA: Diagnosis not present

## 2024-03-21 DIAGNOSIS — D631 Anemia in chronic kidney disease: Secondary | ICD-10-CM | POA: Diagnosis not present

## 2024-03-24 DIAGNOSIS — E876 Hypokalemia: Secondary | ICD-10-CM | POA: Diagnosis not present

## 2024-03-24 DIAGNOSIS — N186 End stage renal disease: Secondary | ICD-10-CM | POA: Diagnosis not present

## 2024-03-24 DIAGNOSIS — D631 Anemia in chronic kidney disease: Secondary | ICD-10-CM | POA: Diagnosis not present

## 2024-03-24 DIAGNOSIS — N2581 Secondary hyperparathyroidism of renal origin: Secondary | ICD-10-CM | POA: Diagnosis not present

## 2024-03-24 DIAGNOSIS — Z992 Dependence on renal dialysis: Secondary | ICD-10-CM | POA: Diagnosis not present

## 2024-03-24 DIAGNOSIS — D689 Coagulation defect, unspecified: Secondary | ICD-10-CM | POA: Diagnosis not present

## 2024-03-24 DIAGNOSIS — T7840XD Allergy, unspecified, subsequent encounter: Secondary | ICD-10-CM | POA: Diagnosis not present

## 2024-03-26 DIAGNOSIS — T7840XD Allergy, unspecified, subsequent encounter: Secondary | ICD-10-CM | POA: Diagnosis not present

## 2024-03-26 DIAGNOSIS — D689 Coagulation defect, unspecified: Secondary | ICD-10-CM | POA: Diagnosis not present

## 2024-03-26 DIAGNOSIS — N2581 Secondary hyperparathyroidism of renal origin: Secondary | ICD-10-CM | POA: Diagnosis not present

## 2024-03-26 DIAGNOSIS — E1122 Type 2 diabetes mellitus with diabetic chronic kidney disease: Secondary | ICD-10-CM | POA: Diagnosis not present

## 2024-03-26 DIAGNOSIS — Z992 Dependence on renal dialysis: Secondary | ICD-10-CM | POA: Diagnosis not present

## 2024-03-26 DIAGNOSIS — E876 Hypokalemia: Secondary | ICD-10-CM | POA: Diagnosis not present

## 2024-03-26 DIAGNOSIS — N186 End stage renal disease: Secondary | ICD-10-CM | POA: Diagnosis not present

## 2024-03-26 DIAGNOSIS — D631 Anemia in chronic kidney disease: Secondary | ICD-10-CM | POA: Diagnosis not present

## 2024-03-28 DIAGNOSIS — D689 Coagulation defect, unspecified: Secondary | ICD-10-CM | POA: Diagnosis not present

## 2024-03-28 DIAGNOSIS — N2581 Secondary hyperparathyroidism of renal origin: Secondary | ICD-10-CM | POA: Diagnosis not present

## 2024-03-28 DIAGNOSIS — N186 End stage renal disease: Secondary | ICD-10-CM | POA: Diagnosis not present

## 2024-03-28 DIAGNOSIS — D631 Anemia in chronic kidney disease: Secondary | ICD-10-CM | POA: Diagnosis not present

## 2024-03-28 DIAGNOSIS — Z992 Dependence on renal dialysis: Secondary | ICD-10-CM | POA: Diagnosis not present

## 2024-03-28 DIAGNOSIS — T7840XD Allergy, unspecified, subsequent encounter: Secondary | ICD-10-CM | POA: Diagnosis not present

## 2024-03-28 DIAGNOSIS — I159 Secondary hypertension, unspecified: Secondary | ICD-10-CM | POA: Diagnosis not present

## 2024-03-28 DIAGNOSIS — E876 Hypokalemia: Secondary | ICD-10-CM | POA: Diagnosis not present

## 2024-03-31 DIAGNOSIS — D631 Anemia in chronic kidney disease: Secondary | ICD-10-CM | POA: Diagnosis not present

## 2024-03-31 DIAGNOSIS — T7840XD Allergy, unspecified, subsequent encounter: Secondary | ICD-10-CM | POA: Diagnosis not present

## 2024-03-31 DIAGNOSIS — N2581 Secondary hyperparathyroidism of renal origin: Secondary | ICD-10-CM | POA: Diagnosis not present

## 2024-03-31 DIAGNOSIS — N186 End stage renal disease: Secondary | ICD-10-CM | POA: Diagnosis not present

## 2024-03-31 DIAGNOSIS — Z992 Dependence on renal dialysis: Secondary | ICD-10-CM | POA: Diagnosis not present

## 2024-03-31 DIAGNOSIS — I159 Secondary hypertension, unspecified: Secondary | ICD-10-CM | POA: Diagnosis not present

## 2024-03-31 DIAGNOSIS — D689 Coagulation defect, unspecified: Secondary | ICD-10-CM | POA: Diagnosis not present

## 2024-03-31 DIAGNOSIS — E876 Hypokalemia: Secondary | ICD-10-CM | POA: Diagnosis not present

## 2024-04-02 DIAGNOSIS — H15849 Scleral ectasia, unspecified eye: Secondary | ICD-10-CM | POA: Diagnosis not present

## 2024-04-02 DIAGNOSIS — I129 Hypertensive chronic kidney disease with stage 1 through stage 4 chronic kidney disease, or unspecified chronic kidney disease: Secondary | ICD-10-CM | POA: Diagnosis not present

## 2024-04-02 DIAGNOSIS — D689 Coagulation defect, unspecified: Secondary | ICD-10-CM | POA: Diagnosis not present

## 2024-04-02 DIAGNOSIS — E1122 Type 2 diabetes mellitus with diabetic chronic kidney disease: Secondary | ICD-10-CM | POA: Diagnosis not present

## 2024-04-02 DIAGNOSIS — N186 End stage renal disease: Secondary | ICD-10-CM | POA: Diagnosis not present

## 2024-04-02 DIAGNOSIS — Z992 Dependence on renal dialysis: Secondary | ICD-10-CM | POA: Diagnosis not present

## 2024-04-02 DIAGNOSIS — E876 Hypokalemia: Secondary | ICD-10-CM | POA: Diagnosis not present

## 2024-04-02 DIAGNOSIS — T7840XD Allergy, unspecified, subsequent encounter: Secondary | ICD-10-CM | POA: Diagnosis not present

## 2024-04-02 DIAGNOSIS — I159 Secondary hypertension, unspecified: Secondary | ICD-10-CM | POA: Diagnosis not present

## 2024-04-02 DIAGNOSIS — D631 Anemia in chronic kidney disease: Secondary | ICD-10-CM | POA: Diagnosis not present

## 2024-04-02 DIAGNOSIS — N2581 Secondary hyperparathyroidism of renal origin: Secondary | ICD-10-CM | POA: Diagnosis not present

## 2024-04-04 DIAGNOSIS — T7840XD Allergy, unspecified, subsequent encounter: Secondary | ICD-10-CM | POA: Diagnosis not present

## 2024-04-04 DIAGNOSIS — D689 Coagulation defect, unspecified: Secondary | ICD-10-CM | POA: Diagnosis not present

## 2024-04-04 DIAGNOSIS — E876 Hypokalemia: Secondary | ICD-10-CM | POA: Diagnosis not present

## 2024-04-04 DIAGNOSIS — D631 Anemia in chronic kidney disease: Secondary | ICD-10-CM | POA: Diagnosis not present

## 2024-04-04 DIAGNOSIS — N186 End stage renal disease: Secondary | ICD-10-CM | POA: Diagnosis not present

## 2024-04-04 DIAGNOSIS — I159 Secondary hypertension, unspecified: Secondary | ICD-10-CM | POA: Diagnosis not present

## 2024-04-04 DIAGNOSIS — Z992 Dependence on renal dialysis: Secondary | ICD-10-CM | POA: Diagnosis not present

## 2024-04-04 DIAGNOSIS — N2581 Secondary hyperparathyroidism of renal origin: Secondary | ICD-10-CM | POA: Diagnosis not present

## 2024-04-07 DIAGNOSIS — D689 Coagulation defect, unspecified: Secondary | ICD-10-CM | POA: Diagnosis not present

## 2024-04-07 DIAGNOSIS — N186 End stage renal disease: Secondary | ICD-10-CM | POA: Diagnosis not present

## 2024-04-07 DIAGNOSIS — Z992 Dependence on renal dialysis: Secondary | ICD-10-CM | POA: Diagnosis not present

## 2024-04-07 DIAGNOSIS — E876 Hypokalemia: Secondary | ICD-10-CM | POA: Diagnosis not present

## 2024-04-07 DIAGNOSIS — D631 Anemia in chronic kidney disease: Secondary | ICD-10-CM | POA: Diagnosis not present

## 2024-04-07 DIAGNOSIS — T7840XD Allergy, unspecified, subsequent encounter: Secondary | ICD-10-CM | POA: Diagnosis not present

## 2024-04-07 DIAGNOSIS — N2581 Secondary hyperparathyroidism of renal origin: Secondary | ICD-10-CM | POA: Diagnosis not present

## 2024-04-07 DIAGNOSIS — I159 Secondary hypertension, unspecified: Secondary | ICD-10-CM | POA: Diagnosis not present

## 2024-04-09 DIAGNOSIS — T7840XD Allergy, unspecified, subsequent encounter: Secondary | ICD-10-CM | POA: Diagnosis not present

## 2024-04-09 DIAGNOSIS — N186 End stage renal disease: Secondary | ICD-10-CM | POA: Diagnosis not present

## 2024-04-09 DIAGNOSIS — Z992 Dependence on renal dialysis: Secondary | ICD-10-CM | POA: Diagnosis not present

## 2024-04-09 DIAGNOSIS — N2581 Secondary hyperparathyroidism of renal origin: Secondary | ICD-10-CM | POA: Diagnosis not present

## 2024-04-09 DIAGNOSIS — D689 Coagulation defect, unspecified: Secondary | ICD-10-CM | POA: Diagnosis not present

## 2024-04-09 DIAGNOSIS — E876 Hypokalemia: Secondary | ICD-10-CM | POA: Diagnosis not present

## 2024-04-09 DIAGNOSIS — I159 Secondary hypertension, unspecified: Secondary | ICD-10-CM | POA: Diagnosis not present

## 2024-04-09 DIAGNOSIS — D631 Anemia in chronic kidney disease: Secondary | ICD-10-CM | POA: Diagnosis not present

## 2024-04-11 DIAGNOSIS — I159 Secondary hypertension, unspecified: Secondary | ICD-10-CM | POA: Diagnosis not present

## 2024-04-11 DIAGNOSIS — D689 Coagulation defect, unspecified: Secondary | ICD-10-CM | POA: Diagnosis not present

## 2024-04-11 DIAGNOSIS — N186 End stage renal disease: Secondary | ICD-10-CM | POA: Diagnosis not present

## 2024-04-11 DIAGNOSIS — D631 Anemia in chronic kidney disease: Secondary | ICD-10-CM | POA: Diagnosis not present

## 2024-04-11 DIAGNOSIS — N2581 Secondary hyperparathyroidism of renal origin: Secondary | ICD-10-CM | POA: Diagnosis not present

## 2024-04-11 DIAGNOSIS — Z992 Dependence on renal dialysis: Secondary | ICD-10-CM | POA: Diagnosis not present

## 2024-04-11 DIAGNOSIS — T7840XD Allergy, unspecified, subsequent encounter: Secondary | ICD-10-CM | POA: Diagnosis not present

## 2024-04-11 DIAGNOSIS — E876 Hypokalemia: Secondary | ICD-10-CM | POA: Diagnosis not present

## 2024-04-14 DIAGNOSIS — E876 Hypokalemia: Secondary | ICD-10-CM | POA: Diagnosis not present

## 2024-04-14 DIAGNOSIS — T7840XD Allergy, unspecified, subsequent encounter: Secondary | ICD-10-CM | POA: Diagnosis not present

## 2024-04-14 DIAGNOSIS — N186 End stage renal disease: Secondary | ICD-10-CM | POA: Diagnosis not present

## 2024-04-14 DIAGNOSIS — D689 Coagulation defect, unspecified: Secondary | ICD-10-CM | POA: Diagnosis not present

## 2024-04-14 DIAGNOSIS — Z992 Dependence on renal dialysis: Secondary | ICD-10-CM | POA: Diagnosis not present

## 2024-04-14 DIAGNOSIS — I159 Secondary hypertension, unspecified: Secondary | ICD-10-CM | POA: Diagnosis not present

## 2024-04-14 DIAGNOSIS — D631 Anemia in chronic kidney disease: Secondary | ICD-10-CM | POA: Diagnosis not present

## 2024-04-14 DIAGNOSIS — N2581 Secondary hyperparathyroidism of renal origin: Secondary | ICD-10-CM | POA: Diagnosis not present

## 2024-04-16 DIAGNOSIS — T7840XD Allergy, unspecified, subsequent encounter: Secondary | ICD-10-CM | POA: Diagnosis not present

## 2024-04-16 DIAGNOSIS — D631 Anemia in chronic kidney disease: Secondary | ICD-10-CM | POA: Diagnosis not present

## 2024-04-16 DIAGNOSIS — N186 End stage renal disease: Secondary | ICD-10-CM | POA: Diagnosis not present

## 2024-04-16 DIAGNOSIS — Z992 Dependence on renal dialysis: Secondary | ICD-10-CM | POA: Diagnosis not present

## 2024-04-16 DIAGNOSIS — E876 Hypokalemia: Secondary | ICD-10-CM | POA: Diagnosis not present

## 2024-04-16 DIAGNOSIS — D689 Coagulation defect, unspecified: Secondary | ICD-10-CM | POA: Diagnosis not present

## 2024-04-16 DIAGNOSIS — I159 Secondary hypertension, unspecified: Secondary | ICD-10-CM | POA: Diagnosis not present

## 2024-04-16 DIAGNOSIS — N2581 Secondary hyperparathyroidism of renal origin: Secondary | ICD-10-CM | POA: Diagnosis not present

## 2024-04-18 DIAGNOSIS — I159 Secondary hypertension, unspecified: Secondary | ICD-10-CM | POA: Diagnosis not present

## 2024-04-18 DIAGNOSIS — D631 Anemia in chronic kidney disease: Secondary | ICD-10-CM | POA: Diagnosis not present

## 2024-04-18 DIAGNOSIS — E876 Hypokalemia: Secondary | ICD-10-CM | POA: Diagnosis not present

## 2024-04-18 DIAGNOSIS — Z992 Dependence on renal dialysis: Secondary | ICD-10-CM | POA: Diagnosis not present

## 2024-04-18 DIAGNOSIS — T7840XD Allergy, unspecified, subsequent encounter: Secondary | ICD-10-CM | POA: Diagnosis not present

## 2024-04-18 DIAGNOSIS — N2581 Secondary hyperparathyroidism of renal origin: Secondary | ICD-10-CM | POA: Diagnosis not present

## 2024-04-18 DIAGNOSIS — N186 End stage renal disease: Secondary | ICD-10-CM | POA: Diagnosis not present

## 2024-04-18 DIAGNOSIS — D689 Coagulation defect, unspecified: Secondary | ICD-10-CM | POA: Diagnosis not present

## 2024-04-21 DIAGNOSIS — D631 Anemia in chronic kidney disease: Secondary | ICD-10-CM | POA: Diagnosis not present

## 2024-04-21 DIAGNOSIS — I159 Secondary hypertension, unspecified: Secondary | ICD-10-CM | POA: Diagnosis not present

## 2024-04-21 DIAGNOSIS — Z992 Dependence on renal dialysis: Secondary | ICD-10-CM | POA: Diagnosis not present

## 2024-04-21 DIAGNOSIS — E876 Hypokalemia: Secondary | ICD-10-CM | POA: Diagnosis not present

## 2024-04-21 DIAGNOSIS — D689 Coagulation defect, unspecified: Secondary | ICD-10-CM | POA: Diagnosis not present

## 2024-04-21 DIAGNOSIS — T7840XD Allergy, unspecified, subsequent encounter: Secondary | ICD-10-CM | POA: Diagnosis not present

## 2024-04-21 DIAGNOSIS — N2581 Secondary hyperparathyroidism of renal origin: Secondary | ICD-10-CM | POA: Diagnosis not present

## 2024-04-21 DIAGNOSIS — N186 End stage renal disease: Secondary | ICD-10-CM | POA: Diagnosis not present

## 2024-04-23 DIAGNOSIS — E876 Hypokalemia: Secondary | ICD-10-CM | POA: Diagnosis not present

## 2024-04-23 DIAGNOSIS — D689 Coagulation defect, unspecified: Secondary | ICD-10-CM | POA: Diagnosis not present

## 2024-04-23 DIAGNOSIS — N2581 Secondary hyperparathyroidism of renal origin: Secondary | ICD-10-CM | POA: Diagnosis not present

## 2024-04-23 DIAGNOSIS — T7840XD Allergy, unspecified, subsequent encounter: Secondary | ICD-10-CM | POA: Diagnosis not present

## 2024-04-23 DIAGNOSIS — I159 Secondary hypertension, unspecified: Secondary | ICD-10-CM | POA: Diagnosis not present

## 2024-04-23 DIAGNOSIS — N186 End stage renal disease: Secondary | ICD-10-CM | POA: Diagnosis not present

## 2024-04-23 DIAGNOSIS — D631 Anemia in chronic kidney disease: Secondary | ICD-10-CM | POA: Diagnosis not present

## 2024-04-23 DIAGNOSIS — Z992 Dependence on renal dialysis: Secondary | ICD-10-CM | POA: Diagnosis not present

## 2024-04-25 DIAGNOSIS — Z992 Dependence on renal dialysis: Secondary | ICD-10-CM | POA: Diagnosis not present

## 2024-04-25 DIAGNOSIS — T7840XD Allergy, unspecified, subsequent encounter: Secondary | ICD-10-CM | POA: Diagnosis not present

## 2024-04-25 DIAGNOSIS — I159 Secondary hypertension, unspecified: Secondary | ICD-10-CM | POA: Diagnosis not present

## 2024-04-25 DIAGNOSIS — N2581 Secondary hyperparathyroidism of renal origin: Secondary | ICD-10-CM | POA: Diagnosis not present

## 2024-04-25 DIAGNOSIS — D631 Anemia in chronic kidney disease: Secondary | ICD-10-CM | POA: Diagnosis not present

## 2024-04-25 DIAGNOSIS — E876 Hypokalemia: Secondary | ICD-10-CM | POA: Diagnosis not present

## 2024-04-25 DIAGNOSIS — D689 Coagulation defect, unspecified: Secondary | ICD-10-CM | POA: Diagnosis not present

## 2024-04-25 DIAGNOSIS — N186 End stage renal disease: Secondary | ICD-10-CM | POA: Diagnosis not present

## 2024-04-26 DIAGNOSIS — Z992 Dependence on renal dialysis: Secondary | ICD-10-CM | POA: Diagnosis not present

## 2024-04-26 DIAGNOSIS — N186 End stage renal disease: Secondary | ICD-10-CM | POA: Diagnosis not present

## 2024-04-26 DIAGNOSIS — E1122 Type 2 diabetes mellitus with diabetic chronic kidney disease: Secondary | ICD-10-CM | POA: Diagnosis not present

## 2024-04-28 DIAGNOSIS — N2581 Secondary hyperparathyroidism of renal origin: Secondary | ICD-10-CM | POA: Diagnosis not present

## 2024-04-28 DIAGNOSIS — R519 Headache, unspecified: Secondary | ICD-10-CM | POA: Diagnosis not present

## 2024-04-28 DIAGNOSIS — Z992 Dependence on renal dialysis: Secondary | ICD-10-CM | POA: Diagnosis not present

## 2024-04-28 DIAGNOSIS — D631 Anemia in chronic kidney disease: Secondary | ICD-10-CM | POA: Diagnosis not present

## 2024-04-28 DIAGNOSIS — T7840XD Allergy, unspecified, subsequent encounter: Secondary | ICD-10-CM | POA: Diagnosis not present

## 2024-04-28 DIAGNOSIS — E876 Hypokalemia: Secondary | ICD-10-CM | POA: Diagnosis not present

## 2024-04-28 DIAGNOSIS — D689 Coagulation defect, unspecified: Secondary | ICD-10-CM | POA: Diagnosis not present

## 2024-04-28 DIAGNOSIS — N186 End stage renal disease: Secondary | ICD-10-CM | POA: Diagnosis not present

## 2024-04-30 DIAGNOSIS — T7840XD Allergy, unspecified, subsequent encounter: Secondary | ICD-10-CM | POA: Diagnosis not present

## 2024-04-30 DIAGNOSIS — Z992 Dependence on renal dialysis: Secondary | ICD-10-CM | POA: Diagnosis not present

## 2024-04-30 DIAGNOSIS — D689 Coagulation defect, unspecified: Secondary | ICD-10-CM | POA: Diagnosis not present

## 2024-04-30 DIAGNOSIS — N2581 Secondary hyperparathyroidism of renal origin: Secondary | ICD-10-CM | POA: Diagnosis not present

## 2024-04-30 DIAGNOSIS — D631 Anemia in chronic kidney disease: Secondary | ICD-10-CM | POA: Diagnosis not present

## 2024-04-30 DIAGNOSIS — E876 Hypokalemia: Secondary | ICD-10-CM | POA: Diagnosis not present

## 2024-04-30 DIAGNOSIS — R519 Headache, unspecified: Secondary | ICD-10-CM | POA: Diagnosis not present

## 2024-04-30 DIAGNOSIS — N186 End stage renal disease: Secondary | ICD-10-CM | POA: Diagnosis not present

## 2024-05-03 DIAGNOSIS — D631 Anemia in chronic kidney disease: Secondary | ICD-10-CM | POA: Diagnosis not present

## 2024-05-03 DIAGNOSIS — N2581 Secondary hyperparathyroidism of renal origin: Secondary | ICD-10-CM | POA: Diagnosis not present

## 2024-05-03 DIAGNOSIS — E876 Hypokalemia: Secondary | ICD-10-CM | POA: Diagnosis not present

## 2024-05-03 DIAGNOSIS — R519 Headache, unspecified: Secondary | ICD-10-CM | POA: Diagnosis not present

## 2024-05-03 DIAGNOSIS — Z992 Dependence on renal dialysis: Secondary | ICD-10-CM | POA: Diagnosis not present

## 2024-05-03 DIAGNOSIS — T7840XD Allergy, unspecified, subsequent encounter: Secondary | ICD-10-CM | POA: Diagnosis not present

## 2024-05-03 DIAGNOSIS — D689 Coagulation defect, unspecified: Secondary | ICD-10-CM | POA: Diagnosis not present

## 2024-05-03 DIAGNOSIS — N186 End stage renal disease: Secondary | ICD-10-CM | POA: Diagnosis not present

## 2024-05-05 DIAGNOSIS — D689 Coagulation defect, unspecified: Secondary | ICD-10-CM | POA: Diagnosis not present

## 2024-05-05 DIAGNOSIS — E876 Hypokalemia: Secondary | ICD-10-CM | POA: Diagnosis not present

## 2024-05-05 DIAGNOSIS — N2581 Secondary hyperparathyroidism of renal origin: Secondary | ICD-10-CM | POA: Diagnosis not present

## 2024-05-05 DIAGNOSIS — N186 End stage renal disease: Secondary | ICD-10-CM | POA: Diagnosis not present

## 2024-05-05 DIAGNOSIS — R519 Headache, unspecified: Secondary | ICD-10-CM | POA: Diagnosis not present

## 2024-05-05 DIAGNOSIS — Z992 Dependence on renal dialysis: Secondary | ICD-10-CM | POA: Diagnosis not present

## 2024-05-05 DIAGNOSIS — T7840XD Allergy, unspecified, subsequent encounter: Secondary | ICD-10-CM | POA: Diagnosis not present

## 2024-05-05 DIAGNOSIS — D631 Anemia in chronic kidney disease: Secondary | ICD-10-CM | POA: Diagnosis not present

## 2024-05-07 DIAGNOSIS — R519 Headache, unspecified: Secondary | ICD-10-CM | POA: Diagnosis not present

## 2024-05-07 DIAGNOSIS — N186 End stage renal disease: Secondary | ICD-10-CM | POA: Diagnosis not present

## 2024-05-07 DIAGNOSIS — T7840XD Allergy, unspecified, subsequent encounter: Secondary | ICD-10-CM | POA: Diagnosis not present

## 2024-05-07 DIAGNOSIS — E876 Hypokalemia: Secondary | ICD-10-CM | POA: Diagnosis not present

## 2024-05-07 DIAGNOSIS — N2581 Secondary hyperparathyroidism of renal origin: Secondary | ICD-10-CM | POA: Diagnosis not present

## 2024-05-07 DIAGNOSIS — Z992 Dependence on renal dialysis: Secondary | ICD-10-CM | POA: Diagnosis not present

## 2024-05-07 DIAGNOSIS — D631 Anemia in chronic kidney disease: Secondary | ICD-10-CM | POA: Diagnosis not present

## 2024-05-07 DIAGNOSIS — D689 Coagulation defect, unspecified: Secondary | ICD-10-CM | POA: Diagnosis not present

## 2024-05-09 DIAGNOSIS — Z992 Dependence on renal dialysis: Secondary | ICD-10-CM | POA: Diagnosis not present

## 2024-05-09 DIAGNOSIS — R519 Headache, unspecified: Secondary | ICD-10-CM | POA: Diagnosis not present

## 2024-05-09 DIAGNOSIS — D689 Coagulation defect, unspecified: Secondary | ICD-10-CM | POA: Diagnosis not present

## 2024-05-09 DIAGNOSIS — T7840XD Allergy, unspecified, subsequent encounter: Secondary | ICD-10-CM | POA: Diagnosis not present

## 2024-05-09 DIAGNOSIS — D631 Anemia in chronic kidney disease: Secondary | ICD-10-CM | POA: Diagnosis not present

## 2024-05-09 DIAGNOSIS — N2581 Secondary hyperparathyroidism of renal origin: Secondary | ICD-10-CM | POA: Diagnosis not present

## 2024-05-09 DIAGNOSIS — E876 Hypokalemia: Secondary | ICD-10-CM | POA: Diagnosis not present

## 2024-05-09 DIAGNOSIS — N186 End stage renal disease: Secondary | ICD-10-CM | POA: Diagnosis not present

## 2024-05-12 DIAGNOSIS — Z992 Dependence on renal dialysis: Secondary | ICD-10-CM | POA: Diagnosis not present

## 2024-05-12 DIAGNOSIS — D689 Coagulation defect, unspecified: Secondary | ICD-10-CM | POA: Diagnosis not present

## 2024-05-12 DIAGNOSIS — R519 Headache, unspecified: Secondary | ICD-10-CM | POA: Diagnosis not present

## 2024-05-12 DIAGNOSIS — E876 Hypokalemia: Secondary | ICD-10-CM | POA: Diagnosis not present

## 2024-05-12 DIAGNOSIS — D631 Anemia in chronic kidney disease: Secondary | ICD-10-CM | POA: Diagnosis not present

## 2024-05-12 DIAGNOSIS — N2581 Secondary hyperparathyroidism of renal origin: Secondary | ICD-10-CM | POA: Diagnosis not present

## 2024-05-12 DIAGNOSIS — T7840XD Allergy, unspecified, subsequent encounter: Secondary | ICD-10-CM | POA: Diagnosis not present

## 2024-05-12 DIAGNOSIS — N186 End stage renal disease: Secondary | ICD-10-CM | POA: Diagnosis not present

## 2024-05-14 DIAGNOSIS — N186 End stage renal disease: Secondary | ICD-10-CM | POA: Diagnosis not present

## 2024-05-14 DIAGNOSIS — D631 Anemia in chronic kidney disease: Secondary | ICD-10-CM | POA: Diagnosis not present

## 2024-05-14 DIAGNOSIS — Z992 Dependence on renal dialysis: Secondary | ICD-10-CM | POA: Diagnosis not present

## 2024-05-14 DIAGNOSIS — R519 Headache, unspecified: Secondary | ICD-10-CM | POA: Diagnosis not present

## 2024-05-14 DIAGNOSIS — D689 Coagulation defect, unspecified: Secondary | ICD-10-CM | POA: Diagnosis not present

## 2024-05-14 DIAGNOSIS — N2581 Secondary hyperparathyroidism of renal origin: Secondary | ICD-10-CM | POA: Diagnosis not present

## 2024-05-14 DIAGNOSIS — E876 Hypokalemia: Secondary | ICD-10-CM | POA: Diagnosis not present

## 2024-05-14 DIAGNOSIS — T7840XD Allergy, unspecified, subsequent encounter: Secondary | ICD-10-CM | POA: Diagnosis not present

## 2024-05-16 DIAGNOSIS — R519 Headache, unspecified: Secondary | ICD-10-CM | POA: Diagnosis not present

## 2024-05-16 DIAGNOSIS — D689 Coagulation defect, unspecified: Secondary | ICD-10-CM | POA: Diagnosis not present

## 2024-05-16 DIAGNOSIS — E876 Hypokalemia: Secondary | ICD-10-CM | POA: Diagnosis not present

## 2024-05-16 DIAGNOSIS — N2581 Secondary hyperparathyroidism of renal origin: Secondary | ICD-10-CM | POA: Diagnosis not present

## 2024-05-16 DIAGNOSIS — T7840XD Allergy, unspecified, subsequent encounter: Secondary | ICD-10-CM | POA: Diagnosis not present

## 2024-05-16 DIAGNOSIS — Z992 Dependence on renal dialysis: Secondary | ICD-10-CM | POA: Diagnosis not present

## 2024-05-16 DIAGNOSIS — N186 End stage renal disease: Secondary | ICD-10-CM | POA: Diagnosis not present

## 2024-05-16 DIAGNOSIS — D631 Anemia in chronic kidney disease: Secondary | ICD-10-CM | POA: Diagnosis not present

## 2024-05-19 DIAGNOSIS — T7840XD Allergy, unspecified, subsequent encounter: Secondary | ICD-10-CM | POA: Diagnosis not present

## 2024-05-19 DIAGNOSIS — Z992 Dependence on renal dialysis: Secondary | ICD-10-CM | POA: Diagnosis not present

## 2024-05-19 DIAGNOSIS — R519 Headache, unspecified: Secondary | ICD-10-CM | POA: Diagnosis not present

## 2024-05-19 DIAGNOSIS — N186 End stage renal disease: Secondary | ICD-10-CM | POA: Diagnosis not present

## 2024-05-19 DIAGNOSIS — D689 Coagulation defect, unspecified: Secondary | ICD-10-CM | POA: Diagnosis not present

## 2024-05-19 DIAGNOSIS — D631 Anemia in chronic kidney disease: Secondary | ICD-10-CM | POA: Diagnosis not present

## 2024-05-19 DIAGNOSIS — E876 Hypokalemia: Secondary | ICD-10-CM | POA: Diagnosis not present

## 2024-05-19 DIAGNOSIS — N2581 Secondary hyperparathyroidism of renal origin: Secondary | ICD-10-CM | POA: Diagnosis not present

## 2024-05-21 DIAGNOSIS — D631 Anemia in chronic kidney disease: Secondary | ICD-10-CM | POA: Diagnosis not present

## 2024-05-21 DIAGNOSIS — E876 Hypokalemia: Secondary | ICD-10-CM | POA: Diagnosis not present

## 2024-05-21 DIAGNOSIS — Z992 Dependence on renal dialysis: Secondary | ICD-10-CM | POA: Diagnosis not present

## 2024-05-21 DIAGNOSIS — D689 Coagulation defect, unspecified: Secondary | ICD-10-CM | POA: Diagnosis not present

## 2024-05-21 DIAGNOSIS — T7840XD Allergy, unspecified, subsequent encounter: Secondary | ICD-10-CM | POA: Diagnosis not present

## 2024-05-21 DIAGNOSIS — R519 Headache, unspecified: Secondary | ICD-10-CM | POA: Diagnosis not present

## 2024-05-21 DIAGNOSIS — N2581 Secondary hyperparathyroidism of renal origin: Secondary | ICD-10-CM | POA: Diagnosis not present

## 2024-05-21 DIAGNOSIS — N186 End stage renal disease: Secondary | ICD-10-CM | POA: Diagnosis not present

## 2024-05-23 DIAGNOSIS — E876 Hypokalemia: Secondary | ICD-10-CM | POA: Diagnosis not present

## 2024-05-23 DIAGNOSIS — D631 Anemia in chronic kidney disease: Secondary | ICD-10-CM | POA: Diagnosis not present

## 2024-05-23 DIAGNOSIS — R519 Headache, unspecified: Secondary | ICD-10-CM | POA: Diagnosis not present

## 2024-05-23 DIAGNOSIS — Z992 Dependence on renal dialysis: Secondary | ICD-10-CM | POA: Diagnosis not present

## 2024-05-23 DIAGNOSIS — N2581 Secondary hyperparathyroidism of renal origin: Secondary | ICD-10-CM | POA: Diagnosis not present

## 2024-05-23 DIAGNOSIS — T7840XD Allergy, unspecified, subsequent encounter: Secondary | ICD-10-CM | POA: Diagnosis not present

## 2024-05-23 DIAGNOSIS — D689 Coagulation defect, unspecified: Secondary | ICD-10-CM | POA: Diagnosis not present

## 2024-05-23 DIAGNOSIS — N186 End stage renal disease: Secondary | ICD-10-CM | POA: Diagnosis not present

## 2024-05-26 DIAGNOSIS — E876 Hypokalemia: Secondary | ICD-10-CM | POA: Diagnosis not present

## 2024-05-26 DIAGNOSIS — D631 Anemia in chronic kidney disease: Secondary | ICD-10-CM | POA: Diagnosis not present

## 2024-05-26 DIAGNOSIS — N2581 Secondary hyperparathyroidism of renal origin: Secondary | ICD-10-CM | POA: Diagnosis not present

## 2024-05-26 DIAGNOSIS — T7840XD Allergy, unspecified, subsequent encounter: Secondary | ICD-10-CM | POA: Diagnosis not present

## 2024-05-26 DIAGNOSIS — N186 End stage renal disease: Secondary | ICD-10-CM | POA: Diagnosis not present

## 2024-05-26 DIAGNOSIS — E1122 Type 2 diabetes mellitus with diabetic chronic kidney disease: Secondary | ICD-10-CM | POA: Diagnosis not present

## 2024-05-26 DIAGNOSIS — D689 Coagulation defect, unspecified: Secondary | ICD-10-CM | POA: Diagnosis not present

## 2024-05-26 DIAGNOSIS — Z992 Dependence on renal dialysis: Secondary | ICD-10-CM | POA: Diagnosis not present

## 2024-05-26 DIAGNOSIS — R519 Headache, unspecified: Secondary | ICD-10-CM | POA: Diagnosis not present

## 2024-05-28 DIAGNOSIS — R519 Headache, unspecified: Secondary | ICD-10-CM | POA: Diagnosis not present

## 2024-05-28 DIAGNOSIS — N2581 Secondary hyperparathyroidism of renal origin: Secondary | ICD-10-CM | POA: Diagnosis not present

## 2024-05-28 DIAGNOSIS — D631 Anemia in chronic kidney disease: Secondary | ICD-10-CM | POA: Diagnosis not present

## 2024-05-28 DIAGNOSIS — E876 Hypokalemia: Secondary | ICD-10-CM | POA: Diagnosis not present

## 2024-05-28 DIAGNOSIS — N186 End stage renal disease: Secondary | ICD-10-CM | POA: Diagnosis not present

## 2024-05-28 DIAGNOSIS — D689 Coagulation defect, unspecified: Secondary | ICD-10-CM | POA: Diagnosis not present

## 2024-05-28 DIAGNOSIS — T7840XD Allergy, unspecified, subsequent encounter: Secondary | ICD-10-CM | POA: Diagnosis not present

## 2024-05-28 DIAGNOSIS — Z992 Dependence on renal dialysis: Secondary | ICD-10-CM | POA: Diagnosis not present

## 2024-05-30 DIAGNOSIS — E876 Hypokalemia: Secondary | ICD-10-CM | POA: Diagnosis not present

## 2024-05-30 DIAGNOSIS — N186 End stage renal disease: Secondary | ICD-10-CM | POA: Diagnosis not present

## 2024-05-30 DIAGNOSIS — Z992 Dependence on renal dialysis: Secondary | ICD-10-CM | POA: Diagnosis not present

## 2024-05-30 DIAGNOSIS — R519 Headache, unspecified: Secondary | ICD-10-CM | POA: Diagnosis not present

## 2024-05-30 DIAGNOSIS — D689 Coagulation defect, unspecified: Secondary | ICD-10-CM | POA: Diagnosis not present

## 2024-05-30 DIAGNOSIS — T7840XD Allergy, unspecified, subsequent encounter: Secondary | ICD-10-CM | POA: Diagnosis not present

## 2024-05-30 DIAGNOSIS — D631 Anemia in chronic kidney disease: Secondary | ICD-10-CM | POA: Diagnosis not present

## 2024-05-30 DIAGNOSIS — N2581 Secondary hyperparathyroidism of renal origin: Secondary | ICD-10-CM | POA: Diagnosis not present

## 2024-06-02 DIAGNOSIS — N186 End stage renal disease: Secondary | ICD-10-CM | POA: Diagnosis not present

## 2024-06-02 DIAGNOSIS — N2581 Secondary hyperparathyroidism of renal origin: Secondary | ICD-10-CM | POA: Diagnosis not present

## 2024-06-02 DIAGNOSIS — D631 Anemia in chronic kidney disease: Secondary | ICD-10-CM | POA: Diagnosis not present

## 2024-06-02 DIAGNOSIS — R519 Headache, unspecified: Secondary | ICD-10-CM | POA: Diagnosis not present

## 2024-06-02 DIAGNOSIS — Z992 Dependence on renal dialysis: Secondary | ICD-10-CM | POA: Diagnosis not present

## 2024-06-02 DIAGNOSIS — T7840XD Allergy, unspecified, subsequent encounter: Secondary | ICD-10-CM | POA: Diagnosis not present

## 2024-06-02 DIAGNOSIS — D689 Coagulation defect, unspecified: Secondary | ICD-10-CM | POA: Diagnosis not present

## 2024-06-02 DIAGNOSIS — E876 Hypokalemia: Secondary | ICD-10-CM | POA: Diagnosis not present

## 2024-06-04 DIAGNOSIS — R519 Headache, unspecified: Secondary | ICD-10-CM | POA: Diagnosis not present

## 2024-06-04 DIAGNOSIS — N186 End stage renal disease: Secondary | ICD-10-CM | POA: Diagnosis not present

## 2024-06-04 DIAGNOSIS — D689 Coagulation defect, unspecified: Secondary | ICD-10-CM | POA: Diagnosis not present

## 2024-06-04 DIAGNOSIS — Z992 Dependence on renal dialysis: Secondary | ICD-10-CM | POA: Diagnosis not present

## 2024-06-04 DIAGNOSIS — E876 Hypokalemia: Secondary | ICD-10-CM | POA: Diagnosis not present

## 2024-06-04 DIAGNOSIS — N2581 Secondary hyperparathyroidism of renal origin: Secondary | ICD-10-CM | POA: Diagnosis not present

## 2024-06-04 DIAGNOSIS — T7840XD Allergy, unspecified, subsequent encounter: Secondary | ICD-10-CM | POA: Diagnosis not present

## 2024-06-04 DIAGNOSIS — D631 Anemia in chronic kidney disease: Secondary | ICD-10-CM | POA: Diagnosis not present

## 2024-06-06 DIAGNOSIS — D631 Anemia in chronic kidney disease: Secondary | ICD-10-CM | POA: Diagnosis not present

## 2024-06-06 DIAGNOSIS — N2581 Secondary hyperparathyroidism of renal origin: Secondary | ICD-10-CM | POA: Diagnosis not present

## 2024-06-06 DIAGNOSIS — N186 End stage renal disease: Secondary | ICD-10-CM | POA: Diagnosis not present

## 2024-06-06 DIAGNOSIS — T7840XD Allergy, unspecified, subsequent encounter: Secondary | ICD-10-CM | POA: Diagnosis not present

## 2024-06-06 DIAGNOSIS — E876 Hypokalemia: Secondary | ICD-10-CM | POA: Diagnosis not present

## 2024-06-06 DIAGNOSIS — D689 Coagulation defect, unspecified: Secondary | ICD-10-CM | POA: Diagnosis not present

## 2024-06-06 DIAGNOSIS — R519 Headache, unspecified: Secondary | ICD-10-CM | POA: Diagnosis not present

## 2024-06-06 DIAGNOSIS — Z992 Dependence on renal dialysis: Secondary | ICD-10-CM | POA: Diagnosis not present

## 2024-06-11 DIAGNOSIS — N186 End stage renal disease: Secondary | ICD-10-CM | POA: Diagnosis not present

## 2024-06-11 DIAGNOSIS — Z992 Dependence on renal dialysis: Secondary | ICD-10-CM | POA: Diagnosis not present

## 2024-06-11 DIAGNOSIS — N2581 Secondary hyperparathyroidism of renal origin: Secondary | ICD-10-CM | POA: Diagnosis not present

## 2024-06-13 DIAGNOSIS — N186 End stage renal disease: Secondary | ICD-10-CM | POA: Diagnosis not present

## 2024-06-13 DIAGNOSIS — Z992 Dependence on renal dialysis: Secondary | ICD-10-CM | POA: Diagnosis not present

## 2024-06-13 DIAGNOSIS — N2581 Secondary hyperparathyroidism of renal origin: Secondary | ICD-10-CM | POA: Diagnosis not present

## 2024-06-16 DIAGNOSIS — D689 Coagulation defect, unspecified: Secondary | ICD-10-CM | POA: Diagnosis not present

## 2024-06-16 DIAGNOSIS — D631 Anemia in chronic kidney disease: Secondary | ICD-10-CM | POA: Diagnosis not present

## 2024-06-16 DIAGNOSIS — N186 End stage renal disease: Secondary | ICD-10-CM | POA: Diagnosis not present

## 2024-06-16 DIAGNOSIS — R519 Headache, unspecified: Secondary | ICD-10-CM | POA: Diagnosis not present

## 2024-06-16 DIAGNOSIS — N2581 Secondary hyperparathyroidism of renal origin: Secondary | ICD-10-CM | POA: Diagnosis not present

## 2024-06-16 DIAGNOSIS — T7840XD Allergy, unspecified, subsequent encounter: Secondary | ICD-10-CM | POA: Diagnosis not present

## 2024-06-16 DIAGNOSIS — Z992 Dependence on renal dialysis: Secondary | ICD-10-CM | POA: Diagnosis not present

## 2024-06-16 DIAGNOSIS — E876 Hypokalemia: Secondary | ICD-10-CM | POA: Diagnosis not present

## 2024-06-18 DIAGNOSIS — Z992 Dependence on renal dialysis: Secondary | ICD-10-CM | POA: Diagnosis not present

## 2024-06-18 DIAGNOSIS — D689 Coagulation defect, unspecified: Secondary | ICD-10-CM | POA: Diagnosis not present

## 2024-06-18 DIAGNOSIS — E876 Hypokalemia: Secondary | ICD-10-CM | POA: Diagnosis not present

## 2024-06-18 DIAGNOSIS — T7840XD Allergy, unspecified, subsequent encounter: Secondary | ICD-10-CM | POA: Diagnosis not present

## 2024-06-18 DIAGNOSIS — R519 Headache, unspecified: Secondary | ICD-10-CM | POA: Diagnosis not present

## 2024-06-18 DIAGNOSIS — N2581 Secondary hyperparathyroidism of renal origin: Secondary | ICD-10-CM | POA: Diagnosis not present

## 2024-06-18 DIAGNOSIS — N186 End stage renal disease: Secondary | ICD-10-CM | POA: Diagnosis not present

## 2024-06-18 DIAGNOSIS — D631 Anemia in chronic kidney disease: Secondary | ICD-10-CM | POA: Diagnosis not present

## 2024-06-20 DIAGNOSIS — D631 Anemia in chronic kidney disease: Secondary | ICD-10-CM | POA: Diagnosis not present

## 2024-06-20 DIAGNOSIS — D689 Coagulation defect, unspecified: Secondary | ICD-10-CM | POA: Diagnosis not present

## 2024-06-20 DIAGNOSIS — R519 Headache, unspecified: Secondary | ICD-10-CM | POA: Diagnosis not present

## 2024-06-20 DIAGNOSIS — T7840XD Allergy, unspecified, subsequent encounter: Secondary | ICD-10-CM | POA: Diagnosis not present

## 2024-06-20 DIAGNOSIS — N186 End stage renal disease: Secondary | ICD-10-CM | POA: Diagnosis not present

## 2024-06-20 DIAGNOSIS — Z992 Dependence on renal dialysis: Secondary | ICD-10-CM | POA: Diagnosis not present

## 2024-06-20 DIAGNOSIS — E876 Hypokalemia: Secondary | ICD-10-CM | POA: Diagnosis not present

## 2024-06-20 DIAGNOSIS — N2581 Secondary hyperparathyroidism of renal origin: Secondary | ICD-10-CM | POA: Diagnosis not present

## 2024-06-23 DIAGNOSIS — T7840XD Allergy, unspecified, subsequent encounter: Secondary | ICD-10-CM | POA: Diagnosis not present

## 2024-06-23 DIAGNOSIS — Z992 Dependence on renal dialysis: Secondary | ICD-10-CM | POA: Diagnosis not present

## 2024-06-23 DIAGNOSIS — R519 Headache, unspecified: Secondary | ICD-10-CM | POA: Diagnosis not present

## 2024-06-23 DIAGNOSIS — D631 Anemia in chronic kidney disease: Secondary | ICD-10-CM | POA: Diagnosis not present

## 2024-06-23 DIAGNOSIS — N186 End stage renal disease: Secondary | ICD-10-CM | POA: Diagnosis not present

## 2024-06-23 DIAGNOSIS — D689 Coagulation defect, unspecified: Secondary | ICD-10-CM | POA: Diagnosis not present

## 2024-06-23 DIAGNOSIS — E876 Hypokalemia: Secondary | ICD-10-CM | POA: Diagnosis not present

## 2024-06-23 DIAGNOSIS — N2581 Secondary hyperparathyroidism of renal origin: Secondary | ICD-10-CM | POA: Diagnosis not present

## 2024-06-25 DIAGNOSIS — D631 Anemia in chronic kidney disease: Secondary | ICD-10-CM | POA: Diagnosis not present

## 2024-06-25 DIAGNOSIS — Z992 Dependence on renal dialysis: Secondary | ICD-10-CM | POA: Diagnosis not present

## 2024-06-25 DIAGNOSIS — D689 Coagulation defect, unspecified: Secondary | ICD-10-CM | POA: Diagnosis not present

## 2024-06-25 DIAGNOSIS — N186 End stage renal disease: Secondary | ICD-10-CM | POA: Diagnosis not present

## 2024-06-25 DIAGNOSIS — R519 Headache, unspecified: Secondary | ICD-10-CM | POA: Diagnosis not present

## 2024-06-25 DIAGNOSIS — T7840XD Allergy, unspecified, subsequent encounter: Secondary | ICD-10-CM | POA: Diagnosis not present

## 2024-06-25 DIAGNOSIS — N2581 Secondary hyperparathyroidism of renal origin: Secondary | ICD-10-CM | POA: Diagnosis not present

## 2024-06-25 DIAGNOSIS — E876 Hypokalemia: Secondary | ICD-10-CM | POA: Diagnosis not present

## 2024-06-26 DIAGNOSIS — N186 End stage renal disease: Secondary | ICD-10-CM | POA: Diagnosis not present

## 2024-06-26 DIAGNOSIS — Z992 Dependence on renal dialysis: Secondary | ICD-10-CM | POA: Diagnosis not present

## 2024-06-26 DIAGNOSIS — E1122 Type 2 diabetes mellitus with diabetic chronic kidney disease: Secondary | ICD-10-CM | POA: Diagnosis not present

## 2024-06-27 DIAGNOSIS — N186 End stage renal disease: Secondary | ICD-10-CM | POA: Diagnosis not present

## 2024-06-27 DIAGNOSIS — D689 Coagulation defect, unspecified: Secondary | ICD-10-CM | POA: Diagnosis not present

## 2024-06-27 DIAGNOSIS — D631 Anemia in chronic kidney disease: Secondary | ICD-10-CM | POA: Diagnosis not present

## 2024-06-27 DIAGNOSIS — Z992 Dependence on renal dialysis: Secondary | ICD-10-CM | POA: Diagnosis not present

## 2024-06-27 DIAGNOSIS — N2581 Secondary hyperparathyroidism of renal origin: Secondary | ICD-10-CM | POA: Diagnosis not present

## 2024-06-27 DIAGNOSIS — E876 Hypokalemia: Secondary | ICD-10-CM | POA: Diagnosis not present

## 2024-06-27 DIAGNOSIS — T7840XD Allergy, unspecified, subsequent encounter: Secondary | ICD-10-CM | POA: Diagnosis not present

## 2024-06-30 DIAGNOSIS — Z992 Dependence on renal dialysis: Secondary | ICD-10-CM | POA: Diagnosis not present

## 2024-06-30 DIAGNOSIS — D631 Anemia in chronic kidney disease: Secondary | ICD-10-CM | POA: Diagnosis not present

## 2024-06-30 DIAGNOSIS — D689 Coagulation defect, unspecified: Secondary | ICD-10-CM | POA: Diagnosis not present

## 2024-06-30 DIAGNOSIS — T7840XD Allergy, unspecified, subsequent encounter: Secondary | ICD-10-CM | POA: Diagnosis not present

## 2024-06-30 DIAGNOSIS — N2581 Secondary hyperparathyroidism of renal origin: Secondary | ICD-10-CM | POA: Diagnosis not present

## 2024-06-30 DIAGNOSIS — E876 Hypokalemia: Secondary | ICD-10-CM | POA: Diagnosis not present

## 2024-07-04 DIAGNOSIS — D689 Coagulation defect, unspecified: Secondary | ICD-10-CM | POA: Diagnosis not present

## 2024-07-04 DIAGNOSIS — N2581 Secondary hyperparathyroidism of renal origin: Secondary | ICD-10-CM | POA: Diagnosis not present

## 2024-07-04 DIAGNOSIS — T7840XD Allergy, unspecified, subsequent encounter: Secondary | ICD-10-CM | POA: Diagnosis not present

## 2024-07-04 DIAGNOSIS — E876 Hypokalemia: Secondary | ICD-10-CM | POA: Diagnosis not present

## 2024-07-04 DIAGNOSIS — Z992 Dependence on renal dialysis: Secondary | ICD-10-CM | POA: Diagnosis not present

## 2024-07-04 DIAGNOSIS — N186 End stage renal disease: Secondary | ICD-10-CM | POA: Diagnosis not present

## 2024-07-07 DIAGNOSIS — T7840XD Allergy, unspecified, subsequent encounter: Secondary | ICD-10-CM | POA: Diagnosis not present

## 2024-07-07 DIAGNOSIS — D631 Anemia in chronic kidney disease: Secondary | ICD-10-CM | POA: Diagnosis not present

## 2024-07-07 DIAGNOSIS — E876 Hypokalemia: Secondary | ICD-10-CM | POA: Diagnosis not present

## 2024-07-07 DIAGNOSIS — N186 End stage renal disease: Secondary | ICD-10-CM | POA: Diagnosis not present

## 2024-07-07 DIAGNOSIS — N2581 Secondary hyperparathyroidism of renal origin: Secondary | ICD-10-CM | POA: Diagnosis not present

## 2024-07-07 DIAGNOSIS — D689 Coagulation defect, unspecified: Secondary | ICD-10-CM | POA: Diagnosis not present

## 2024-07-09 DIAGNOSIS — D631 Anemia in chronic kidney disease: Secondary | ICD-10-CM | POA: Diagnosis not present

## 2024-07-10 DIAGNOSIS — K7689 Other specified diseases of liver: Secondary | ICD-10-CM | POA: Diagnosis not present

## 2024-07-10 DIAGNOSIS — D175 Benign lipomatous neoplasm of intra-abdominal organs: Secondary | ICD-10-CM | POA: Diagnosis not present

## 2024-07-11 DIAGNOSIS — N2581 Secondary hyperparathyroidism of renal origin: Secondary | ICD-10-CM | POA: Diagnosis not present

## 2024-07-14 DIAGNOSIS — Z992 Dependence on renal dialysis: Secondary | ICD-10-CM | POA: Diagnosis not present

## 2024-07-14 DIAGNOSIS — T7840XD Allergy, unspecified, subsequent encounter: Secondary | ICD-10-CM | POA: Diagnosis not present

## 2024-07-14 DIAGNOSIS — E876 Hypokalemia: Secondary | ICD-10-CM | POA: Diagnosis not present

## 2024-07-14 DIAGNOSIS — N2581 Secondary hyperparathyroidism of renal origin: Secondary | ICD-10-CM | POA: Diagnosis not present

## 2024-07-14 DIAGNOSIS — D689 Coagulation defect, unspecified: Secondary | ICD-10-CM | POA: Diagnosis not present

## 2024-07-14 DIAGNOSIS — N186 End stage renal disease: Secondary | ICD-10-CM | POA: Diagnosis not present

## 2024-07-25 DIAGNOSIS — E876 Hypokalemia: Secondary | ICD-10-CM | POA: Diagnosis not present

## 2024-07-25 DIAGNOSIS — D631 Anemia in chronic kidney disease: Secondary | ICD-10-CM | POA: Diagnosis not present

## 2024-07-25 DIAGNOSIS — N186 End stage renal disease: Secondary | ICD-10-CM | POA: Diagnosis not present

## 2024-07-25 DIAGNOSIS — D689 Coagulation defect, unspecified: Secondary | ICD-10-CM | POA: Diagnosis not present

## 2024-07-25 DIAGNOSIS — Z992 Dependence on renal dialysis: Secondary | ICD-10-CM | POA: Diagnosis not present

## 2024-07-25 DIAGNOSIS — T7840XD Allergy, unspecified, subsequent encounter: Secondary | ICD-10-CM | POA: Diagnosis not present

## 2024-07-25 DIAGNOSIS — N2581 Secondary hyperparathyroidism of renal origin: Secondary | ICD-10-CM | POA: Diagnosis not present

## 2024-07-27 DIAGNOSIS — N186 End stage renal disease: Secondary | ICD-10-CM | POA: Diagnosis not present

## 2024-07-27 DIAGNOSIS — E1122 Type 2 diabetes mellitus with diabetic chronic kidney disease: Secondary | ICD-10-CM | POA: Diagnosis not present

## 2024-07-27 DIAGNOSIS — Z992 Dependence on renal dialysis: Secondary | ICD-10-CM | POA: Diagnosis not present

## 2024-07-28 DIAGNOSIS — Z992 Dependence on renal dialysis: Secondary | ICD-10-CM | POA: Diagnosis not present

## 2024-07-28 DIAGNOSIS — D631 Anemia in chronic kidney disease: Secondary | ICD-10-CM | POA: Diagnosis not present

## 2024-07-28 DIAGNOSIS — N186 End stage renal disease: Secondary | ICD-10-CM | POA: Diagnosis not present

## 2024-07-28 DIAGNOSIS — E876 Hypokalemia: Secondary | ICD-10-CM | POA: Diagnosis not present

## 2024-07-28 DIAGNOSIS — T7840XD Allergy, unspecified, subsequent encounter: Secondary | ICD-10-CM | POA: Diagnosis not present

## 2024-07-28 DIAGNOSIS — R519 Headache, unspecified: Secondary | ICD-10-CM | POA: Diagnosis not present

## 2024-07-28 DIAGNOSIS — T80211A Bloodstream infection due to central venous catheter, initial encounter: Secondary | ICD-10-CM | POA: Diagnosis not present

## 2024-07-28 DIAGNOSIS — D689 Coagulation defect, unspecified: Secondary | ICD-10-CM | POA: Diagnosis not present

## 2024-07-28 DIAGNOSIS — N2581 Secondary hyperparathyroidism of renal origin: Secondary | ICD-10-CM | POA: Diagnosis not present

## 2024-07-30 DIAGNOSIS — D689 Coagulation defect, unspecified: Secondary | ICD-10-CM | POA: Diagnosis not present

## 2024-07-30 DIAGNOSIS — T7840XD Allergy, unspecified, subsequent encounter: Secondary | ICD-10-CM | POA: Diagnosis not present

## 2024-07-30 DIAGNOSIS — N186 End stage renal disease: Secondary | ICD-10-CM | POA: Diagnosis not present

## 2024-07-30 DIAGNOSIS — T80211A Bloodstream infection due to central venous catheter, initial encounter: Secondary | ICD-10-CM | POA: Diagnosis not present

## 2024-07-30 DIAGNOSIS — E876 Hypokalemia: Secondary | ICD-10-CM | POA: Diagnosis not present

## 2024-07-30 DIAGNOSIS — R519 Headache, unspecified: Secondary | ICD-10-CM | POA: Diagnosis not present

## 2024-07-30 DIAGNOSIS — D631 Anemia in chronic kidney disease: Secondary | ICD-10-CM | POA: Diagnosis not present

## 2024-07-30 DIAGNOSIS — Z992 Dependence on renal dialysis: Secondary | ICD-10-CM | POA: Diagnosis not present

## 2024-07-30 DIAGNOSIS — N2581 Secondary hyperparathyroidism of renal origin: Secondary | ICD-10-CM | POA: Diagnosis not present

## 2024-08-01 DIAGNOSIS — E1122 Type 2 diabetes mellitus with diabetic chronic kidney disease: Secondary | ICD-10-CM | POA: Diagnosis not present

## 2024-08-01 DIAGNOSIS — I120 Hypertensive chronic kidney disease with stage 5 chronic kidney disease or end stage renal disease: Secondary | ICD-10-CM | POA: Diagnosis not present

## 2024-08-01 DIAGNOSIS — E876 Hypokalemia: Secondary | ICD-10-CM | POA: Diagnosis not present

## 2024-08-01 DIAGNOSIS — D631 Anemia in chronic kidney disease: Secondary | ICD-10-CM | POA: Diagnosis not present

## 2024-08-01 DIAGNOSIS — Z992 Dependence on renal dialysis: Secondary | ICD-10-CM | POA: Diagnosis not present

## 2024-08-01 DIAGNOSIS — R519 Headache, unspecified: Secondary | ICD-10-CM | POA: Diagnosis not present

## 2024-08-01 DIAGNOSIS — T80211A Bloodstream infection due to central venous catheter, initial encounter: Secondary | ICD-10-CM | POA: Diagnosis not present

## 2024-08-01 DIAGNOSIS — R6883 Chills (without fever): Secondary | ICD-10-CM | POA: Diagnosis not present

## 2024-08-01 DIAGNOSIS — T83511A Infection and inflammatory reaction due to indwelling urethral catheter, initial encounter: Secondary | ICD-10-CM | POA: Diagnosis not present

## 2024-08-01 DIAGNOSIS — T7840XD Allergy, unspecified, subsequent encounter: Secondary | ICD-10-CM | POA: Diagnosis not present

## 2024-08-01 DIAGNOSIS — B9689 Other specified bacterial agents as the cause of diseases classified elsewhere: Secondary | ICD-10-CM | POA: Diagnosis not present

## 2024-08-01 DIAGNOSIS — N186 End stage renal disease: Secondary | ICD-10-CM | POA: Diagnosis not present

## 2024-08-01 DIAGNOSIS — N2581 Secondary hyperparathyroidism of renal origin: Secondary | ICD-10-CM | POA: Diagnosis not present

## 2024-08-01 DIAGNOSIS — D689 Coagulation defect, unspecified: Secondary | ICD-10-CM | POA: Diagnosis not present

## 2024-08-04 DIAGNOSIS — E876 Hypokalemia: Secondary | ICD-10-CM | POA: Diagnosis not present

## 2024-08-04 DIAGNOSIS — T7840XD Allergy, unspecified, subsequent encounter: Secondary | ICD-10-CM | POA: Diagnosis not present

## 2024-08-04 DIAGNOSIS — T80211A Bloodstream infection due to central venous catheter, initial encounter: Secondary | ICD-10-CM | POA: Diagnosis not present

## 2024-08-04 DIAGNOSIS — Z992 Dependence on renal dialysis: Secondary | ICD-10-CM | POA: Diagnosis not present

## 2024-08-04 DIAGNOSIS — N2581 Secondary hyperparathyroidism of renal origin: Secondary | ICD-10-CM | POA: Diagnosis not present

## 2024-08-04 DIAGNOSIS — N186 End stage renal disease: Secondary | ICD-10-CM | POA: Diagnosis not present

## 2024-08-04 DIAGNOSIS — D689 Coagulation defect, unspecified: Secondary | ICD-10-CM | POA: Diagnosis not present

## 2024-08-04 DIAGNOSIS — D631 Anemia in chronic kidney disease: Secondary | ICD-10-CM | POA: Diagnosis not present

## 2024-08-04 DIAGNOSIS — R519 Headache, unspecified: Secondary | ICD-10-CM | POA: Diagnosis not present

## 2024-08-06 DIAGNOSIS — D631 Anemia in chronic kidney disease: Secondary | ICD-10-CM | POA: Diagnosis not present

## 2024-08-06 DIAGNOSIS — R519 Headache, unspecified: Secondary | ICD-10-CM | POA: Diagnosis not present

## 2024-08-06 DIAGNOSIS — Z992 Dependence on renal dialysis: Secondary | ICD-10-CM | POA: Diagnosis not present

## 2024-08-06 DIAGNOSIS — T7840XD Allergy, unspecified, subsequent encounter: Secondary | ICD-10-CM | POA: Diagnosis not present

## 2024-08-06 DIAGNOSIS — D689 Coagulation defect, unspecified: Secondary | ICD-10-CM | POA: Diagnosis not present

## 2024-08-06 DIAGNOSIS — N186 End stage renal disease: Secondary | ICD-10-CM | POA: Diagnosis not present

## 2024-08-06 DIAGNOSIS — E876 Hypokalemia: Secondary | ICD-10-CM | POA: Diagnosis not present

## 2024-08-06 DIAGNOSIS — N2581 Secondary hyperparathyroidism of renal origin: Secondary | ICD-10-CM | POA: Diagnosis not present

## 2024-08-06 DIAGNOSIS — T80211A Bloodstream infection due to central venous catheter, initial encounter: Secondary | ICD-10-CM | POA: Diagnosis not present

## 2024-08-08 DIAGNOSIS — N186 End stage renal disease: Secondary | ICD-10-CM | POA: Diagnosis not present

## 2024-08-08 DIAGNOSIS — D631 Anemia in chronic kidney disease: Secondary | ICD-10-CM | POA: Diagnosis not present

## 2024-08-08 DIAGNOSIS — T80211A Bloodstream infection due to central venous catheter, initial encounter: Secondary | ICD-10-CM | POA: Diagnosis not present

## 2024-08-08 DIAGNOSIS — Z992 Dependence on renal dialysis: Secondary | ICD-10-CM | POA: Diagnosis not present

## 2024-08-08 DIAGNOSIS — E876 Hypokalemia: Secondary | ICD-10-CM | POA: Diagnosis not present

## 2024-08-08 DIAGNOSIS — T7840XD Allergy, unspecified, subsequent encounter: Secondary | ICD-10-CM | POA: Diagnosis not present

## 2024-08-08 DIAGNOSIS — N2581 Secondary hyperparathyroidism of renal origin: Secondary | ICD-10-CM | POA: Diagnosis not present

## 2024-08-08 DIAGNOSIS — D689 Coagulation defect, unspecified: Secondary | ICD-10-CM | POA: Diagnosis not present

## 2024-08-08 DIAGNOSIS — R519 Headache, unspecified: Secondary | ICD-10-CM | POA: Diagnosis not present

## 2024-08-11 DIAGNOSIS — N2581 Secondary hyperparathyroidism of renal origin: Secondary | ICD-10-CM | POA: Diagnosis not present

## 2024-08-11 DIAGNOSIS — D631 Anemia in chronic kidney disease: Secondary | ICD-10-CM | POA: Diagnosis not present

## 2024-08-11 DIAGNOSIS — R519 Headache, unspecified: Secondary | ICD-10-CM | POA: Diagnosis not present

## 2024-08-11 DIAGNOSIS — E876 Hypokalemia: Secondary | ICD-10-CM | POA: Diagnosis not present

## 2024-08-11 DIAGNOSIS — D689 Coagulation defect, unspecified: Secondary | ICD-10-CM | POA: Diagnosis not present

## 2024-08-11 DIAGNOSIS — N186 End stage renal disease: Secondary | ICD-10-CM | POA: Diagnosis not present

## 2024-08-11 DIAGNOSIS — T80211A Bloodstream infection due to central venous catheter, initial encounter: Secondary | ICD-10-CM | POA: Diagnosis not present

## 2024-08-11 DIAGNOSIS — Z992 Dependence on renal dialysis: Secondary | ICD-10-CM | POA: Diagnosis not present

## 2024-08-11 DIAGNOSIS — T7840XD Allergy, unspecified, subsequent encounter: Secondary | ICD-10-CM | POA: Diagnosis not present

## 2024-08-13 DIAGNOSIS — D689 Coagulation defect, unspecified: Secondary | ICD-10-CM | POA: Diagnosis not present

## 2024-08-13 DIAGNOSIS — R519 Headache, unspecified: Secondary | ICD-10-CM | POA: Diagnosis not present

## 2024-08-13 DIAGNOSIS — N2581 Secondary hyperparathyroidism of renal origin: Secondary | ICD-10-CM | POA: Diagnosis not present

## 2024-08-13 DIAGNOSIS — T7840XD Allergy, unspecified, subsequent encounter: Secondary | ICD-10-CM | POA: Diagnosis not present

## 2024-08-13 DIAGNOSIS — N186 End stage renal disease: Secondary | ICD-10-CM | POA: Diagnosis not present

## 2024-08-13 DIAGNOSIS — T80211A Bloodstream infection due to central venous catheter, initial encounter: Secondary | ICD-10-CM | POA: Diagnosis not present

## 2024-08-13 DIAGNOSIS — Z992 Dependence on renal dialysis: Secondary | ICD-10-CM | POA: Diagnosis not present

## 2024-08-13 DIAGNOSIS — E876 Hypokalemia: Secondary | ICD-10-CM | POA: Diagnosis not present

## 2024-08-13 DIAGNOSIS — D631 Anemia in chronic kidney disease: Secondary | ICD-10-CM | POA: Diagnosis not present

## 2024-08-15 DIAGNOSIS — E876 Hypokalemia: Secondary | ICD-10-CM | POA: Diagnosis not present

## 2024-08-15 DIAGNOSIS — N2581 Secondary hyperparathyroidism of renal origin: Secondary | ICD-10-CM | POA: Diagnosis not present

## 2024-08-15 DIAGNOSIS — T7840XD Allergy, unspecified, subsequent encounter: Secondary | ICD-10-CM | POA: Diagnosis not present

## 2024-08-15 DIAGNOSIS — D631 Anemia in chronic kidney disease: Secondary | ICD-10-CM | POA: Diagnosis not present

## 2024-08-15 DIAGNOSIS — N186 End stage renal disease: Secondary | ICD-10-CM | POA: Diagnosis not present

## 2024-08-15 DIAGNOSIS — R519 Headache, unspecified: Secondary | ICD-10-CM | POA: Diagnosis not present

## 2024-08-15 DIAGNOSIS — D689 Coagulation defect, unspecified: Secondary | ICD-10-CM | POA: Diagnosis not present

## 2024-08-15 DIAGNOSIS — Z992 Dependence on renal dialysis: Secondary | ICD-10-CM | POA: Diagnosis not present

## 2024-08-15 DIAGNOSIS — T80211A Bloodstream infection due to central venous catheter, initial encounter: Secondary | ICD-10-CM | POA: Diagnosis not present

## 2024-08-18 DIAGNOSIS — R519 Headache, unspecified: Secondary | ICD-10-CM | POA: Diagnosis not present

## 2024-08-18 DIAGNOSIS — N186 End stage renal disease: Secondary | ICD-10-CM | POA: Diagnosis not present

## 2024-08-18 DIAGNOSIS — E876 Hypokalemia: Secondary | ICD-10-CM | POA: Diagnosis not present

## 2024-08-18 DIAGNOSIS — D631 Anemia in chronic kidney disease: Secondary | ICD-10-CM | POA: Diagnosis not present

## 2024-08-18 DIAGNOSIS — Z992 Dependence on renal dialysis: Secondary | ICD-10-CM | POA: Diagnosis not present

## 2024-08-18 DIAGNOSIS — T7840XD Allergy, unspecified, subsequent encounter: Secondary | ICD-10-CM | POA: Diagnosis not present

## 2024-08-18 DIAGNOSIS — D689 Coagulation defect, unspecified: Secondary | ICD-10-CM | POA: Diagnosis not present

## 2024-08-18 DIAGNOSIS — N2581 Secondary hyperparathyroidism of renal origin: Secondary | ICD-10-CM | POA: Diagnosis not present

## 2024-08-18 DIAGNOSIS — T80211A Bloodstream infection due to central venous catheter, initial encounter: Secondary | ICD-10-CM | POA: Diagnosis not present

## 2024-08-20 DIAGNOSIS — T80211A Bloodstream infection due to central venous catheter, initial encounter: Secondary | ICD-10-CM | POA: Diagnosis not present

## 2024-08-20 DIAGNOSIS — Z992 Dependence on renal dialysis: Secondary | ICD-10-CM | POA: Diagnosis not present

## 2024-08-20 DIAGNOSIS — E876 Hypokalemia: Secondary | ICD-10-CM | POA: Diagnosis not present

## 2024-08-20 DIAGNOSIS — T7840XD Allergy, unspecified, subsequent encounter: Secondary | ICD-10-CM | POA: Diagnosis not present

## 2024-08-20 DIAGNOSIS — N2581 Secondary hyperparathyroidism of renal origin: Secondary | ICD-10-CM | POA: Diagnosis not present

## 2024-08-20 DIAGNOSIS — N186 End stage renal disease: Secondary | ICD-10-CM | POA: Diagnosis not present

## 2024-08-20 DIAGNOSIS — D689 Coagulation defect, unspecified: Secondary | ICD-10-CM | POA: Diagnosis not present

## 2024-08-20 DIAGNOSIS — R519 Headache, unspecified: Secondary | ICD-10-CM | POA: Diagnosis not present

## 2024-08-20 DIAGNOSIS — D631 Anemia in chronic kidney disease: Secondary | ICD-10-CM | POA: Diagnosis not present

## 2024-08-22 DIAGNOSIS — R519 Headache, unspecified: Secondary | ICD-10-CM | POA: Diagnosis not present

## 2024-08-22 DIAGNOSIS — N2581 Secondary hyperparathyroidism of renal origin: Secondary | ICD-10-CM | POA: Diagnosis not present

## 2024-08-22 DIAGNOSIS — D631 Anemia in chronic kidney disease: Secondary | ICD-10-CM | POA: Diagnosis not present

## 2024-08-22 DIAGNOSIS — D689 Coagulation defect, unspecified: Secondary | ICD-10-CM | POA: Diagnosis not present

## 2024-08-22 DIAGNOSIS — E876 Hypokalemia: Secondary | ICD-10-CM | POA: Diagnosis not present

## 2024-08-22 DIAGNOSIS — T7840XD Allergy, unspecified, subsequent encounter: Secondary | ICD-10-CM | POA: Diagnosis not present

## 2024-08-22 DIAGNOSIS — Z992 Dependence on renal dialysis: Secondary | ICD-10-CM | POA: Diagnosis not present

## 2024-08-22 DIAGNOSIS — T80211A Bloodstream infection due to central venous catheter, initial encounter: Secondary | ICD-10-CM | POA: Diagnosis not present

## 2024-08-22 DIAGNOSIS — N186 End stage renal disease: Secondary | ICD-10-CM | POA: Diagnosis not present

## 2024-08-25 DIAGNOSIS — D631 Anemia in chronic kidney disease: Secondary | ICD-10-CM | POA: Diagnosis not present

## 2024-08-25 DIAGNOSIS — N2581 Secondary hyperparathyroidism of renal origin: Secondary | ICD-10-CM | POA: Diagnosis not present

## 2024-08-25 DIAGNOSIS — N186 End stage renal disease: Secondary | ICD-10-CM | POA: Diagnosis not present

## 2024-08-25 DIAGNOSIS — D689 Coagulation defect, unspecified: Secondary | ICD-10-CM | POA: Diagnosis not present

## 2024-08-25 DIAGNOSIS — T80211A Bloodstream infection due to central venous catheter, initial encounter: Secondary | ICD-10-CM | POA: Diagnosis not present

## 2024-08-25 DIAGNOSIS — T7840XD Allergy, unspecified, subsequent encounter: Secondary | ICD-10-CM | POA: Diagnosis not present

## 2024-08-25 DIAGNOSIS — R519 Headache, unspecified: Secondary | ICD-10-CM | POA: Diagnosis not present

## 2024-08-25 DIAGNOSIS — Z992 Dependence on renal dialysis: Secondary | ICD-10-CM | POA: Diagnosis not present

## 2024-08-25 DIAGNOSIS — E876 Hypokalemia: Secondary | ICD-10-CM | POA: Diagnosis not present

## 2024-08-26 DIAGNOSIS — E1122 Type 2 diabetes mellitus with diabetic chronic kidney disease: Secondary | ICD-10-CM | POA: Diagnosis not present

## 2024-08-26 DIAGNOSIS — Z992 Dependence on renal dialysis: Secondary | ICD-10-CM | POA: Diagnosis not present

## 2024-08-26 DIAGNOSIS — N186 End stage renal disease: Secondary | ICD-10-CM | POA: Diagnosis not present

## 2024-09-09 ENCOUNTER — Other Ambulatory Visit (HOSPITAL_COMMUNITY): Payer: Self-pay | Admitting: Emergency Medicine

## 2024-09-09 ENCOUNTER — Encounter (HOSPITAL_COMMUNITY): Payer: Self-pay

## 2024-09-09 ENCOUNTER — Other Ambulatory Visit: Payer: Self-pay

## 2024-09-09 ENCOUNTER — Emergency Department (HOSPITAL_COMMUNITY)
Admission: EM | Admit: 2024-09-09 | Discharge: 2024-09-09 | Disposition: A | Discharge status: Home / Self Care | Attending: Emergency Medicine | Admitting: Emergency Medicine

## 2024-09-09 DIAGNOSIS — Z452 Encounter for adjustment and management of vascular access device: Secondary | ICD-10-CM

## 2024-09-09 DIAGNOSIS — Z794 Long term (current) use of insulin: Secondary | ICD-10-CM | POA: Insufficient documentation

## 2024-09-09 DIAGNOSIS — N186 End stage renal disease: Secondary | ICD-10-CM | POA: Diagnosis not present

## 2024-09-09 DIAGNOSIS — R0981 Nasal congestion: Secondary | ICD-10-CM | POA: Diagnosis not present

## 2024-09-09 DIAGNOSIS — Z992 Dependence on renal dialysis: Secondary | ICD-10-CM | POA: Diagnosis not present

## 2024-09-09 DIAGNOSIS — Z959 Presence of cardiac and vascular implant and graft, unspecified: Secondary | ICD-10-CM | POA: Insufficient documentation

## 2024-09-09 DIAGNOSIS — Z79899 Other long term (current) drug therapy: Secondary | ICD-10-CM | POA: Diagnosis not present

## 2024-09-09 DIAGNOSIS — E1122 Type 2 diabetes mellitus with diabetic chronic kidney disease: Secondary | ICD-10-CM | POA: Insufficient documentation

## 2024-09-09 DIAGNOSIS — I120 Hypertensive chronic kidney disease with stage 5 chronic kidney disease or end stage renal disease: Secondary | ICD-10-CM | POA: Insufficient documentation

## 2024-09-09 NOTE — ED Triage Notes (Addendum)
 Pt. Stated. I was taking a shower and my dialysis catheter came out . Its in the subclavian  Last dialysis was yesterday. Competed treatment.

## 2024-09-09 NOTE — ED Provider Notes (Addendum)
  EMERGENCY DEPARTMENT AT [facility] Provider Note   CSN: [id number] Arrival date & time: 09/09/24  1031     Patient presents with: Vascular Access Problem   Jackie Wade is a 78 y.o. female.   Patient is a 78 year old female with a history of diabetes, end-stage renal disease on dialysis, hypertension who has a right tunneling catheter and reports when she woke up this morning she noticed that sometime during the night she had pulled the catheter out.  This catheter had last been changed in April 2024.  She did state in the last 3 to 4 weeks there have been some drainage from the catheter she had a blood culture that was indeterminate and had 2-3 doses of vancomycin  but had repeat blood cultures that were negative and the catheter had been working fine.  She last had a full course of dialysis yesterday.  She does report over the weekend she developed a hoarse voice, significant nasal congestion and a dry cough with a few episodes of loose stool and felt pretty lousy over the weekend but is starting to feel better now.  Her voice is starting to come back and she is denying any shortness of breath.  She does not take any anticoagulation.  The only reason she came today is because her dialysis catheter got pulled out.  The history is provided by the patient and medical records.       Prior to Admission medications   Medication Sig Start Date End Date Taking? Authorizing Provider  acetaminophen  (TYLENOL ) 325 MG tablet Take 2 tablets (650 mg total) by mouth every 4 (four) hours as needed for headache or mild pain. 12/25/21   Arrien, Mauricio Daniel, MD  carvedilol  (COREG ) 6.25 MG tablet Take 1 tablet (6.25 mg total) by mouth 2 (two) times daily with a meal. 12/25/21 04/16/23  Arrien, Elidia Sieving, MD  JANUVIA 100 MG tablet Take 100 mg by mouth daily. 07/08/20   [provider]  rosuvastatin  (CRESTOR ) 5 MG tablet Take 1 tablet (5 mg total) by mouth  daily. Patient taking differently: Take 5 mg by mouth daily. One tablet twice a day 06/12/21   Fausto Sor A, DO  sevelamer carbonate (RENVELA) 800 MG tablet Take by mouth. 07/26/21   [provider]  insulin  glargine (LANTUS ) 100 UNIT/ML Solostar Pen Inject 3 Units into the skin daily. 07/04/21 07/04/21  Jens Durand, MD    Allergies: Patient has no known allergies.    Review of Systems  Updated Vital Signs BP (!) 190/89   Pulse 81   Temp 97.7 F (36.5 C)   Resp 18   Ht 5' 3 (1.6 m)   Wt 63.5 kg   SpO2 97%   BMI 24.80 kg/m   Physical Exam Vitals and nursing note reviewed.  Constitutional:      General: She is not in acute distress.    Appearance: She is well-developed.  HENT:     Head: Normocephalic and atraumatic.     Nose: Congestion present.     Mouth/Throat:     Mouth: Mucous membranes are moist.     Comments: Hoarse voice Eyes:     Pupils: Pupils are equal, round, and reactive to light.  Cardiovascular:     Rate and Rhythm: Normal rate and regular rhythm.     Heart sounds: Normal heart sounds. No murmur heard.    No friction rub.  Pulmonary:     Effort: Pulmonary effort is normal.  Breath sounds: Normal breath sounds. No wheezing or rales.     Comments: Tunneling catheter location is not bleeding and dialysis catheter is completely out Abdominal:     General: Bowel sounds are normal. There is no distension.     Palpations: Abdomen is soft.     Tenderness: There is no abdominal tenderness. There is no guarding or rebound.  Musculoskeletal:        General: No tenderness. Normal range of motion.     Right lower leg: No edema.     Left lower leg: No edema.     Comments: No edema  Skin:    General: Skin is warm and dry.     Findings: No rash.  Neurological:     Mental Status: She is alert and oriented to person, place, and time.     Cranial Nerves: No cranial nerve deficit.  Psychiatric:        Behavior: Behavior normal.     (all labs  ordered are listed, but only abnormal results are displayed) Labs Reviewed - No data to display  EKG: None  Radiology: No results found.   Procedures   Medications Ordered in the ED - No data to display                                  Medical Decision Making  Pt with multiple medical problems and comorbidities and presenting today with a complaint that caries a high risk for morbidity and mortality.  Here today because her dialysis catheter came out.  She is not having any active bleeding and does not take any anticoagulation.  The catheter had been in since 02/2023 and her last dialysis was yesterday.  She has no findings of fluid overload.  Does have some URI symptoms that she reports are improving.  Reached out to nephrology who recommended for speaking with vascular surgery to see if she can get on the schedule to get a new tunneling catheter.  5:03 PM Discussed with IR and they are going to contact the patient first thing in the morning to get her catheter placed.  Patient will be n.p.o. after midnight.  Discussed this with the patient and she is comfortable with this plan. Patient will go to [facility] at 930 tomorrow      Final diagnoses:  ESRD (end stage renal disease) (HCC)  Needs peripherally inserted central catheter (PICC)    ED Discharge Orders     None          Doretha Folks, MD 09/09/24 1703    Doretha Folks, MD 09/09/24 1717

## 2024-09-09 NOTE — ED Notes (Signed)
 Pt noted that she usually takes blood pressure medicine but panicked this morning and forgot. She realized once we took her blood pressure and it was high. She took her medicine and her blood pressure has started to drop.

## 2024-09-09 NOTE — Discharge Instructions (Addendum)
 You will go to the main entrance at [facility] and tell them you are coming for a procedure in Interventional Radiology and they should be able to direct you where you need to go. You will need to arrive at 9:30am for the procedure to be done at 11:30am. You will need a driver and do not eat after midnight tonight.

## 2024-09-10 ENCOUNTER — Ambulatory Visit (HOSPITAL_COMMUNITY)
Admission: RE | Admit: 2024-09-10 | Discharge: 2024-09-10 | Disposition: A | Discharge status: Home / Self Care | Source: Ambulatory Visit | Attending: Emergency Medicine | Admitting: Emergency Medicine

## 2024-09-10 ENCOUNTER — Encounter (HOSPITAL_COMMUNITY): Payer: Self-pay

## 2024-09-10 ENCOUNTER — Other Ambulatory Visit: Payer: Self-pay

## 2024-09-10 VITALS — BP 167/95 | HR 83 | Temp 97.7°F | Resp 16 | Ht 63.0 in | Wt 139.8 lb

## 2024-09-10 DIAGNOSIS — Z7984 Long term (current) use of oral hypoglycemic drugs: Secondary | ICD-10-CM | POA: Diagnosis not present

## 2024-09-10 DIAGNOSIS — Z01818 Encounter for other preprocedural examination: Secondary | ICD-10-CM

## 2024-09-10 DIAGNOSIS — Z992 Dependence on renal dialysis: Secondary | ICD-10-CM | POA: Diagnosis not present

## 2024-09-10 DIAGNOSIS — Z79899 Other long term (current) drug therapy: Secondary | ICD-10-CM | POA: Diagnosis not present

## 2024-09-10 DIAGNOSIS — Z87891 Personal history of nicotine dependence: Secondary | ICD-10-CM | POA: Diagnosis not present

## 2024-09-10 DIAGNOSIS — Z794 Long term (current) use of insulin: Secondary | ICD-10-CM | POA: Insufficient documentation

## 2024-09-10 DIAGNOSIS — N186 End stage renal disease: Secondary | ICD-10-CM | POA: Diagnosis not present

## 2024-09-10 DIAGNOSIS — I120 Hypertensive chronic kidney disease with stage 5 chronic kidney disease or end stage renal disease: Secondary | ICD-10-CM | POA: Diagnosis not present

## 2024-09-10 DIAGNOSIS — E1122 Type 2 diabetes mellitus with diabetic chronic kidney disease: Secondary | ICD-10-CM | POA: Diagnosis not present

## 2024-09-10 DIAGNOSIS — D631 Anemia in chronic kidney disease: Secondary | ICD-10-CM | POA: Insufficient documentation

## 2024-09-10 HISTORY — PX: IR TUNNELED CENTRAL VENOUS CATH PLC W IMG: IMG1939

## 2024-09-10 LAB — GLUCOSE, CAPILLARY: Glucose-Capillary: 95 mg/dL (ref 70–99)

## 2024-09-10 LAB — CBC
HCT: 38.5 % (ref 36.0–46.0)
Hemoglobin: 12.2 g/dL (ref 12.0–15.0)
MCH: 29.5 pg (ref 26.0–34.0)
MCHC: 31.7 g/dL (ref 30.0–36.0)
MCV: 93.2 fL (ref 80.0–100.0)
Platelets: 178 K/uL (ref 150–400)
RBC: 4.13 MIL/uL (ref 3.87–5.11)
RDW: 15.9 % — ABNORMAL HIGH (ref 11.5–15.5)
WBC: 6.9 K/uL (ref 4.0–10.5)
nRBC: 0 % (ref 0.0–0.2)

## 2024-09-10 MED ORDER — FENTANYL CITRATE (PF) 100 MCG/2ML IJ SOLN
INTRAMUSCULAR | Status: AC | PRN
  Administered 2024-09-10 (×2): 25 ug via INTRAVENOUS

## 2024-09-10 MED ORDER — LIDOCAINE HCL 1 % IJ SOLN
INTRAMUSCULAR | Status: AC
  Filled 2024-09-10: qty 20

## 2024-09-10 MED ORDER — MIDAZOLAM HCL 2 MG/2ML IJ SOLN
INTRAMUSCULAR | Status: AC | PRN
  Administered 2024-09-10: .5 mg via INTRAVENOUS
  Administered 2024-09-10: 1 mg via INTRAVENOUS

## 2024-09-10 MED ORDER — ACETAMINOPHEN 500 MG PO TABS
1000.0000 mg | ORAL_TABLET | Freq: Once | ORAL | Status: AC
  Administered 2024-09-10: 1000 mg via ORAL
  Filled 2024-09-10: qty 2

## 2024-09-10 MED ORDER — HEPARIN SOD (PORK) LOCK FLUSH 100 UNIT/ML IV SOLN
500.0000 [IU] | Freq: Once | INTRAVENOUS | Status: DC
Start: 2024-09-10 — End: 2024-09-11

## 2024-09-10 MED ORDER — CEFAZOLIN SODIUM-DEXTROSE 2-4 GM/100ML-% IV SOLN
INTRAVENOUS | Status: AC | PRN
  Administered 2024-09-10: 2 g via INTRAVENOUS

## 2024-09-10 MED ORDER — MIDAZOLAM HCL 2 MG/2ML IJ SOLN
INTRAMUSCULAR | Status: AC
  Filled 2024-09-10: qty 2

## 2024-09-10 MED ORDER — LIDOCAINE HCL 1 % IJ SOLN: 20.0000 mL | Freq: Once | INTRAMUSCULAR | Status: DC

## 2024-09-10 MED ORDER — HEPARIN SOD (PORK) LOCK FLUSH 100 UNIT/ML IV SOLN
INTRAVENOUS | Status: AC
  Filled 2024-09-10: qty 5

## 2024-09-10 MED ORDER — FENTANYL CITRATE (PF) 100 MCG/2ML IJ SOLN
INTRAMUSCULAR | Status: AC
  Filled 2024-09-10: qty 2

## 2024-09-10 MED ORDER — CEFAZOLIN SODIUM-DEXTROSE 2-4 GM/100ML-% IV SOLN
INTRAVENOUS | Status: AC
  Filled 2024-09-10: qty 100

## 2024-09-10 NOTE — Discharge Instructions (Signed)
No showering for 24 hours

## 2024-09-10 NOTE — H&P (Signed)
 Chief Complaint: Patient was seen in consultation today for hemodialysis vascular access concern, with consideration for tunneled hemodialysis catheter placement.  Referring Provider(s): Dr. Benton Shone, MD   Supervising Physician: Jenna Hacker  Patient Status: [facility] - Out-pt  Patient is Full Code  History of Present Illness: Jackie Wade is a 78 y.o. female  with PMHx notable for ESRD on HD, HTN, DMT2, and anemia.  Per Dr. Fonda EDP note yesterday: Patient is a 78 year old female with a history of diabetes, end-stage renal disease on dialysis, hypertension who has a right tunneling catheter and reports when she woke up this morning she noticed that sometime during the night she had pulled the catheter out. This catheter had last been changed in April 2024. She did state in the last 3 to 4 weeks there have been some drainage from the catheter she had a blood culture that was indeterminate and had 2-3 doses of vancomycin  but had repeat blood cultures that were negative and the catheter had been working fine. She last had a full course of dialysis yesterday. She does report over the weekend she developed a hoarse voice, significant nasal congestion and a dry cough with a few episodes of loose stool and felt pretty lousy over the weekend but is starting to feel better now. Her voice is starting to come back and she is denying any shortness of breath. She does not take any anticoagulation. The only reason she came today is because her dialysis catheter got pulled out.    Interventional Radiology was requested for tunneled hemodialysis cathter placement.  Patient is scheduled for same in IR today.   Patient is alert and laying in bed, calm.  Patient is currently with a headache and a sore throat. She feels like she may have a cold. Patient denies any fevers, chest pain, SOB, cough, abdominal pain, nausea, vomiting or bleeding.     Past Medical History:  Diagnosis Date    Anemia    Chronic kidney disease (CKD), stage IV (severe) (HCC)    Diabetes mellitus without complication (HCC)    Hypertension    Renal disorder     Past Surgical History:  Procedure Laterality Date   BASCILIC VEIN TRANSPOSITION Left 07/04/2021   Procedure: LEFT ARM BASCILIC VEIN TRANSPOSITION 1ST STAGE;  Surgeon: Harvey Carlin BRAVO, MD;  Location: [facility] OR;  Service: Vascular;  Laterality: Left;   BASCILIC VEIN TRANSPOSITION Left 12/22/2021   Procedure: LEFT ARM SECOND STAGE BASILIC VEIN TRANSPOSITION;  Surgeon: Magda Debby SAILOR, MD;  Location: [facility] OR;  Service: Vascular;  Laterality: Left;   I & D EXTREMITY Left 12/22/2021   Procedure: IRRIGATION AND DEBRIDEMENT HEMATOMA;  Surgeon: Magda Debby SAILOR, MD;  Location: [facility] OR;  Service: Vascular;  Laterality: Left;   INSERTION OF DIALYSIS CATHETER Right 12/22/2021   Procedure: INSERTION OF 14.5 FR/CH X 19 CM  DIALYSIS CATHETER;  Surgeon: Magda Debby SAILOR, MD;  Location: [facility] OR;  Service: Vascular;  Laterality: Right;   WRIST SURGERY      Allergies: Patient has no known allergies.  Medications: Prior to Admission medications   Medication Sig Start Date End Date Taking? Authorizing Provider  acetaminophen  (TYLENOL ) 325 MG tablet Take 2 tablets (650 mg total) by mouth every 4 (four) hours as needed for headache or mild pain. 12/25/21   Arrien, Mauricio Daniel, MD  carvedilol  (COREG ) 6.25 MG tablet Take 1 tablet (6.25 mg total) by mouth 2 (two) times daily with a meal. 12/25/21 04/16/23  Arrien, Mauricio Daniel, MD  JANUVIA 100 MG tablet Take 100 mg by mouth daily. 07/08/20   [provider]  rosuvastatin  (CRESTOR ) 5 MG tablet Take 1 tablet (5 mg total) by mouth daily. Patient taking differently: Take 5 mg by mouth daily. One tablet twice a day 06/12/21   Fausto Sor A, DO  sevelamer carbonate (RENVELA) 800 MG tablet Take by mouth. 07/26/21   [provider]  insulin  glargine (LANTUS ) 100 UNIT/ML Solostar Pen Inject 3 Units into the  skin daily. 07/04/21 07/04/21  Jens Durand, MD     Family History  Problem Relation Age of Onset   Alzheimer's disease Mother    Premature CHD Neg Hx     Social History   Socioeconomic History   Marital status: Married    Spouse name: Not on file   Number of children: Not on file   Years of education: Not on file   Highest education level: Not on file  Occupational History   Not on file  Tobacco Use   Smoking status: Former    Types: Cigarettes   Smokeless tobacco: Former  Building services engineer status: Never Used  Substance and Sexual Activity   Alcohol use: Yes    Comment: Occasional Artist   Drug use: No   Sexual activity: Not on file  Other Topics Concern   Not on file  Social History Narrative   Right Handed    Lives in a two story home    Social Drivers of Health   Financial Resource Strain: Not on file  Food Insecurity: Not on file  Transportation Needs: Not on file  Physical Activity: Not on file  Stress: Not on file  Social Connections: Not on file     Review of Systems: A 12 point ROS discussed and pertinent positives are indicated in the HPI above.  All other systems are negative.  Vital Signs: Temp 99.1, BP 191/84, Pulse 86, Resp 16, O2 Sat 98%, Ht 5'3, Wt 140 lbs.  Advance Care Plan: The advanced care place/surrogate decision maker was discussed at the time of visit and the patient did not wish to discuss or was not able to name a surrogate decision maker or provide an advance care plan.  Physical Exam Constitutional:      General: She is not in acute distress.    Appearance: Normal appearance.  HENT:     Mouth/Throat:     Mouth: Mucous membranes are dry.  Cardiovascular:     Rate and Rhythm: Regular rhythm. Tachycardia present.     Pulses: Normal pulses.     Heart sounds: Normal heart sounds.  Pulmonary:     Effort: Pulmonary effort is normal.     Breath sounds: Normal breath sounds.  Musculoskeletal:        General: Normal range of  motion.     Cervical back: Normal range of motion.     Comments: Right upper chest wall with dressing at site of prior HD cath. Non tender.  Skin:    General: Skin is warm and dry.  Neurological:     Mental Status: She is alert and oriented to person, place, and time.  Psychiatric:        Mood and Affect: Mood normal.        Behavior: Behavior normal.        Thought Content: Thought content normal.        Judgment: Judgment normal.     Imaging: No results  found.  Labs:  CBC: No results for input(s): WBC, HGB, HCT, PLT in the last 8760 hours.  COAGS: No results for input(s): INR, APTT in the last 8760 hours.  BMP: No results for input(s): NA, K, CL, CO2, GLUCOSE, BUN, CALCIUM , CREATININE, GFRNONAA, GFRAA in the last 8760 hours.  Invalid input(s): CMP  LIVER FUNCTION TESTS: No results for input(s): BILITOT, AST, ALT, ALKPHOS, PROT, ALBUMIN  in the last 8760 hours.  TUMOR MARKERS: No results for input(s): AFPTM, CEA, CA199, CHROMGRNA in the last 8760 hours.  Assessment and Plan: Patient woke up a couple days ago with her tunneled hemodialysis catheter pulled out. She presented to ED, and was referred to IR for new catheter placement as outpatient. Patient presents for scheduled tunneled hemodialysis catheter placement in IR today.  Patient has been NPO since midnight.  All labs and medications are within acceptable parameters. Ancef  for pre-procedure prophylaxis. No known allergies on file.  Risks and benefits discussed with the patient including, but not limited to bleeding, infection, vascular injury, pneumothorax which may require chest tube placement, air embolism or even death  All of the patient's questions were answered, patient is agreeable to proceed. Consent signed and in chart.    Thank you for allowing our service to participate in STAMATIA MASRI 's care.  Electronically Signed: Carlin DELENA Griffon,  PA-C   09/10/2024, 10:18 AM      I spent a total of 40 Minutes in face to face in clinical consultation, greater than 50% of which was counseling/coordinating care for hemodialysis vascular access concern, with consideration for tunneled hemodialysis catheter placement.

## 2024-09-12 ENCOUNTER — Telehealth (HOSPITAL_COMMUNITY): Payer: Self-pay | Admitting: Pharmacy Technician

## 2024-09-12 ENCOUNTER — Encounter (HOSPITAL_COMMUNITY): Payer: Self-pay

## 2024-09-12 ENCOUNTER — Emergency Department (HOSPITAL_COMMUNITY)

## 2024-09-12 ENCOUNTER — Other Ambulatory Visit (HOSPITAL_COMMUNITY): Payer: Self-pay

## 2024-09-12 ENCOUNTER — Emergency Department (HOSPITAL_COMMUNITY)
Admission: EM | Admit: 2024-09-12 | Discharge: 2024-09-12 | Disposition: A | Discharge status: Home / Self Care | Attending: Emergency Medicine | Admitting: Emergency Medicine

## 2024-09-12 ENCOUNTER — Emergency Department (EMERGENCY_DEPARTMENT_HOSPITAL)

## 2024-09-12 ENCOUNTER — Other Ambulatory Visit: Payer: Self-pay

## 2024-09-12 DIAGNOSIS — I82C11 Acute embolism and thrombosis of right internal jugular vein: Secondary | ICD-10-CM | POA: Diagnosis not present

## 2024-09-12 DIAGNOSIS — Z452 Encounter for adjustment and management of vascular access device: Secondary | ICD-10-CM | POA: Diagnosis not present

## 2024-09-12 DIAGNOSIS — Z992 Dependence on renal dialysis: Secondary | ICD-10-CM | POA: Diagnosis not present

## 2024-09-12 DIAGNOSIS — Z7984 Long term (current) use of oral hypoglycemic drugs: Secondary | ICD-10-CM | POA: Insufficient documentation

## 2024-09-12 DIAGNOSIS — I82B11 Acute embolism and thrombosis of right subclavian vein: Secondary | ICD-10-CM | POA: Diagnosis not present

## 2024-09-12 DIAGNOSIS — R053 Chronic cough: Secondary | ICD-10-CM | POA: Diagnosis not present

## 2024-09-12 DIAGNOSIS — I82A11 Acute embolism and thrombosis of right axillary vein: Secondary | ICD-10-CM | POA: Diagnosis not present

## 2024-09-12 DIAGNOSIS — N186 End stage renal disease: Secondary | ICD-10-CM | POA: Insufficient documentation

## 2024-09-12 DIAGNOSIS — M7989 Other specified soft tissue disorders: Secondary | ICD-10-CM

## 2024-09-12 LAB — CBC WITH DIFFERENTIAL/PLATELET
Abs Immature Granulocytes: 0.01 K/uL (ref 0.00–0.07)
Basophils Absolute: 0 K/uL (ref 0.0–0.1)
Basophils Relative: 1 %
Eosinophils Absolute: 0.2 K/uL (ref 0.0–0.5)
Eosinophils Relative: 3 %
HCT: 35.9 % — ABNORMAL LOW (ref 36.0–46.0)
Hemoglobin: 11 g/dL — ABNORMAL LOW (ref 12.0–15.0)
Immature Granulocytes: 0 %
Lymphocytes Relative: 18 %
Lymphs Abs: 1 K/uL (ref 0.7–4.0)
MCH: 28 pg (ref 26.0–34.0)
MCHC: 30.6 g/dL (ref 30.0–36.0)
MCV: 91.3 fL (ref 80.0–100.0)
Monocytes Absolute: 0.3 K/uL (ref 0.1–1.0)
Monocytes Relative: 6 %
Neutro Abs: 3.8 K/uL (ref 1.7–7.7)
Neutrophils Relative %: 72 %
Platelets: 159 K/uL (ref 150–400)
RBC: 3.93 MIL/uL (ref 3.87–5.11)
RDW: 15.8 % — ABNORMAL HIGH (ref 11.5–15.5)
Smear Review: NORMAL
WBC: 5.3 K/uL (ref 4.0–10.5)
nRBC: 0 % (ref 0.0–0.2)

## 2024-09-12 LAB — RENAL FUNCTION PANEL
Albumin: 3.7 g/dL (ref 3.5–5.0)
Anion gap: 14 (ref 5–15)
BUN: 35 mg/dL — ABNORMAL HIGH (ref 8–23)
CO2: 23 mmol/L (ref 22–32)
Calcium: 8.5 mg/dL — ABNORMAL LOW (ref 8.9–10.3)
Chloride: 103 mmol/L (ref 98–111)
Creatinine, Ser: 8.65 mg/dL — ABNORMAL HIGH (ref 0.44–1.00)
GFR, Estimated: 4 mL/min — ABNORMAL LOW (ref >60)
Glucose, Bld: 89 mg/dL (ref 70–99)
Phosphorus: 4.6 mg/dL (ref 2.5–4.6)
Potassium: 3.7 mmol/L (ref 3.5–5.1)
Sodium: 141 mmol/L (ref 135–145)

## 2024-09-12 LAB — RESP PANEL BY RT-PCR (RSV, FLU A&B, COVID)  RVPGX2
Influenza A by PCR: NEGATIVE
Influenza B by PCR: NEGATIVE
Resp Syncytial Virus by PCR: NEGATIVE
SARS Coronavirus 2 by RT PCR: NEGATIVE

## 2024-09-12 LAB — PROTIME-INR
INR: 1.1 (ref 0.8–1.2)
Prothrombin Time: 15.3 s — ABNORMAL HIGH (ref 11.4–15.2)

## 2024-09-12 MED ORDER — HEPARIN BOLUS VIA INFUSION
2000.0000 [IU] | Freq: Once | INTRAVENOUS | Status: AC
  Administered 2024-09-12: 2000 [IU] via INTRAVENOUS
  Filled 2024-09-12: qty 2000

## 2024-09-12 MED ORDER — APIXABAN 2.5 MG PO TABS
10.0000 mg | ORAL_TABLET | Freq: Once | ORAL | Status: AC
  Administered 2024-09-12: 10 mg via ORAL
  Filled 2024-09-12: qty 4

## 2024-09-12 MED ORDER — APIXABAN (ELIQUIS) EDUCATION KIT FOR DVT/PE PATIENTS: PACK | Freq: Once | Status: DC

## 2024-09-12 MED ORDER — APIXABAN (ELIQUIS) VTE STARTER PACK (10MG AND 5MG): ORAL_TABLET | ORAL | 0 refills | Status: DC

## 2024-09-12 MED ORDER — APIXABAN (ELIQUIS) VTE STARTER PACK (10MG AND 5MG)
ORAL_TABLET | ORAL | 0 refills | Status: DC
  Filled 2024-09-12: qty 74, 30d supply, fill #0

## 2024-09-12 MED ADMIN — Heparin Sod (Porcine) in NaCl IV Soln 25000 Unit/250ML-0.45%: 900 [IU]/h | INTRAVENOUS | NDC 63323051701

## 2024-09-12 MED FILL — Heparin Sod (Porcine) in NaCl IV Soln 25000 Unit/250ML-0.45%: 900.0000 [IU]/h | INTRAVENOUS | Qty: 250 | Status: AC

## 2024-09-12 NOTE — Telephone Encounter (Signed)
 Patient Product/process development scientist completed.    The patient is insured through [facility]. Patient has Medicare and is not eligible for a copay card, but may be able to apply for patient assistance or Medicare RX Payment Plan (Patient Must reach out to their plan, if eligible for payment plan), if available.    Ran test claim for Eliquis 5 mg and the current 30 day co-pay is $47.00.   This test claim was processed through [facility]- copay amounts may vary at other pharmacies due to pharmacy/plan contracts, or as the patient moves through the different stages of their insurance plan.     Reyes Sharps, CPHT Pharmacy Technician Patient Advocate Specialist Lead [facility] Patient Advocate Team Direct Number: [phone]  Fax: [phone]

## 2024-09-12 NOTE — ED Provider Notes (Signed)
  HPI:  Jackie Wade is a 78 y.o. female presents to the emergency department with complaints of 2 days of right arm and face swelling.  ROS:  Positive: ecchymosis, cough Negative: lower extremity edema  PE:  BP (!) 160/94   Pulse 81   Temp 97.6 F (36.4 C) (Oral)   Resp 20   SpO2 100%   General: Awake, no distress  HEENT: Atraumatic. Slight right facial swelling Chest: right chest PermCath with surrounding ecchymosis and swelling down to right arm Resp: Normal effort  Cardiac: normal rate Abd: Nondistended, nontender  MSK: moves all extremities without difficulty Neuro: speech clear   MDM: MSE completed and orders placed as needed.    Discussed with the patient that exiting the department prior to completion of the work-up is AMA and there is no guarantee that there are no emergency medical conditions present. Pt states understanding.     Trine Raynell Moder, MD 09/12/24 [phone]

## 2024-09-12 NOTE — ED Provider Notes (Signed)
 [place] EMERGENCY DEPARTMENT AT [facility] Provider Note   CSN: [id number] Arrival date & time: 09/12/24  9466     Patient presents with: Facial Swelling and Nasal Congestion   Jackie Wade is a 78 y.o. female h/o end-stage renal disease on dialysis presents to the emergency department today for evaluation of right arm swelling and some right sided facial swelling.  She denies any trouble swallowing or trouble breathing.  She reports that she went to dialysis however was sent to the emerged ferment from nursing due to the swelling.  She recently had a new tunneled catheter placed on 10 - 15 - 25.  She reports that she started noticing some swelling since then.  Denies any numbness or tingling.  Denies any other trauma to the area.  She denies any shortness of breath or chest pain.  Reports that she has been having a cough for several weeks to months.  Denies any reported fevers.  Denies any weakness.  HPI     Prior to Admission medications   Medication Sig Start Date End Date Taking? Authorizing Provider  acetaminophen  (TYLENOL ) 325 MG tablet Take 2 tablets (650 mg total) by mouth every 4 (four) hours as needed for headache or mild pain. 12/25/21   Arrien, Mauricio Daniel, MD  carvedilol  (COREG ) 6.25 MG tablet Take 1 tablet (6.25 mg total) by mouth 2 (two) times daily with a meal. 12/25/21 09/10/24  Arrien, Elidia Sieving, MD  JANUVIA 100 MG tablet Take 100 mg by mouth daily. 07/08/20   [provider]  rosuvastatin  (CRESTOR ) 5 MG tablet Take 1 tablet (5 mg total) by mouth daily. Patient taking differently: Take 5 mg by mouth daily. One tablet twice a day 06/12/21   Fausto Sor A, DO  sevelamer carbonate (RENVELA) 800 MG tablet Take by mouth. 07/26/21   [provider]  insulin  glargine (LANTUS ) 100 UNIT/ML Solostar Pen Inject 3 Units into the skin daily. 07/04/21 07/04/21  Jens Durand, MD    Allergies: Patient has no known allergies.    Review of  Systems  Constitutional:  Negative for chills and fever.  Respiratory:  Positive for cough.   See HPI  Updated Vital Signs BP (!) 135/104 (BP Location: Right Arm)   Pulse 81   Temp 97.6 F (36.4 C) (Oral)   Resp 18   SpO2 95%   Physical Exam Vitals and nursing note reviewed.  Constitutional:      General: She is not in acute distress.    Appearance: She is not ill-appearing or toxic-appearing.  HENT:     Head:     Comments: Very slight right sided facial swelling.  No crepitus.  No increase in warmth or erythema.  No facial droop noted. Eyes:     General: No scleral icterus. Cardiovascular:     Rate and Rhythm: Normal rate.  Pulmonary:     Effort: Pulmonary effort is normal. No respiratory distress.     Breath sounds: No stridor.  Chest:     Comments: Dialysis catheter present in the right upper chest with bandage overlying.  No surrounding erythema or warmth.  Does have some surrounding bruising.  No purulence or crusting.  No induration or fluctuance. Musculoskeletal:     Comments: Patient does have swelling throughout the right upper extremity.  Coloration and temperature appear and feel symmetric bilaterally.  Compartments are soft.  Palpable distal pulses.  Grip strength is equal.  Strength is intact and symmetric in the bilateral upper  extremities.  Brisk cap refill.  Skin:    General: Skin is warm and dry.  Neurological:     Mental Status: She is alert.     (all labs ordered are listed, but only abnormal results are displayed) Labs Reviewed  CBC WITH DIFFERENTIAL/PLATELET - Abnormal; Notable for the following components:      Result Value   Hemoglobin 11.0 (*)    HCT 35.9 (*)    RDW 15.8 (*)    All other components within normal limits  RENAL FUNCTION PANEL - Abnormal; Notable for the following components:   BUN 35 (*)    Creatinine, Ser 8.65 (*)    Calcium  8.5 (*)    GFR, Estimated 4 (*)    All other components within normal limits  PROTIME-INR - Abnormal;  Notable for the following components:   Prothrombin Time 15.3 (*)    All other components within normal limits  RESP PANEL BY RT-PCR (RSV, FLU A&B, COVID)  [id number]    EKG: None  Radiology: UE Venous Duplex ([facility] and [facility] ONLY) Result Date: 09/12/2024 UPPER VENOUS STUDY  Patient Name:  Jackie Wade  Date of Exam:   09/12/2024 Medical Rec #: [id number]        Accession #:    [id number] Date of Birth: 1946/04/19        Patient Gender: F Patient Age:   29 years Exam Location:  [facility] Procedure:      VAS US  UPPER EXTREMITY VENOUS DUPLEX Referring Phys: RAYNELL NAEGELI --------------------------------------------------------------------------------  Indications: Pain, Swelling, and Edema Other Indications: Patient has a right tunneling catheter and reports when she woke up this morning she noticed that sometime during the night she had pulled the catheter out. Risk Factors: Surgery Occluded left arm AVF 2023 diabetes, end-stage renal disease on dialysis, hypertension. Limitations: Bandages and tape over perm cath right chest area. Comparison Study: No priors. Performing Technologist: Ricka Sturdivant-Jones RDMS, RVT  Examination Guidelines: A complete evaluation includes B-mode imaging, spectral Doppler, color Doppler, and power Doppler as needed of all accessible portions of each vessel. Bilateral testing is considered an integral part of a complete examination. Limited examinations for reoccurring indications may be performed as noted.  Right Findings: +----------+------------+---------+-----------+----------+-------------------+ RIGHT     CompressiblePhasicitySpontaneousProperties      Summary       +----------+------------+---------+-----------+----------+-------------------+ IJV           None       No        No                      Acute        +----------+------------+---------+-----------+----------+-------------------+ Subclavian  Partial      No        No               not  well visualized +----------+------------+---------+-----------+----------+-------------------+ Axillary    Partial      Yes       Yes               Age Indeterminate  +----------+------------+---------+-----------+----------+-------------------+ Brachial      Full                                                      +----------+------------+---------+-----------+----------+-------------------+ Radial        Full                                                      +----------+------------+---------+-----------+----------+-------------------+  Ulnar         Full                                                      +----------+------------+---------+-----------+----------+-------------------+ Cephalic      Full                                                      +----------+------------+---------+-----------+----------+-------------------+ Basilic       Full                                                      +----------+------------+---------+-----------+----------+-------------------+  Left Findings: +----------+------------+---------+-----------+----------+-------+ LEFT      CompressiblePhasicitySpontaneousPropertiesSummary +----------+------------+---------+-----------+----------+-------+ Subclavian               Yes       Yes                      +----------+------------+---------+-----------+----------+-------+  Summary:  Right: Findings consistent with acute deep vein thrombosis involving the right internal jugular vein and right subclavian vein. Findings consistent with age indeterminate deep vein thrombosis involving the right axillary vein.  Left: No evidence of thrombosis in the subclavian.  *See table(s) above for measurements and observations.    Preliminary    DG Chest Portable 1 View Result Date: 09/12/2024 CLINICAL DATA:  Right arm swelling. EXAM: PORTABLE CHEST 1 VIEW COMPARISON:  02/23/2023 FINDINGS: Cardiopericardial silhouette is at  upper limits of normal for size. Low volume film without evidence for pneumothorax or pleural effusion. No pulmonary edema. Right IJ pulmonary artery catheter tip overlies the low SVC level. No acute bony abnormality. IMPRESSION: Low volume film without acute cardiopulmonary findings. Electronically Signed   By: Camellia Candle M.D.   On: 09/12/2024 07:14   IR TUNNELED CENTRAL VENOUS CATH [facility] IMG Result Date: 09/10/2024 INDICATION: The patient has end-stage renal disease and is catheter dependent for hemodialysis. Yesterday her indwelling hemodialysis catheter fell out. Patient presents today for placement of a new tunneled hemodialysis catheter. EXAM: TUNNELED hemodialysis LINE WITH ULTRASOUND AND FLUOROSCOPIC GUIDANCE MEDICATIONS: 2 g Ancef . The antibiotic was given in an appropriate time interval prior to skin puncture. ANESTHESIA/SEDATION: Moderate (conscious) sedation was employed during this procedure. A total of Versed  1.5 mg and Fentanyl  75 mcg was administered intravenously by the radiology nurse. Total intra-service moderate Sedation Time: 30 minutes. The patient's level of consciousness and vital signs were monitored continuously by radiology nursing throughout the procedure under my direct supervision. FLUOROSCOPY: Radiation Exposure Index (as provided by the fluoroscopic device): 2 mGy Kerma COMPLICATIONS: None immediate. PROCEDURE: Informed written consent was obtained from the patient after a discussion of the risks, benefits, and alternatives to treatment. Questions regarding the procedure were encouraged and answered. The right neck and chest were prepped with chlorhexidine  in a sterile fashion, and a sterile drape was applied covering the operative field. Maximum barrier sterile technique with sterile gowns and gloves were used for the procedure. A timeout was performed prior to the initiation of  the procedure. After creating a small venotomy incision, a micropuncture kit was utilized to access  the right internal jugular vein under direct, real-time ultrasound guidance after the overlying soft tissues were anesthetized with 1% lidocaine  with epinephrine. Ultrasound image documentation was performed. The microwire was kinked to measure appropriate catheter length. The micropuncture sheath was exchanged for a peel-away sheath over a guidewire. A 5 French dual lumen tunneled hemodialysis catheter measuring 19 cm was tunneled in a retrograde fashion from the anterior chest wall to the venotomy incision. The catheter was then placed through the peel-away sheath with tip ultimately positioned at the superior caval-atrial junction. Final catheter positioning was confirmed and documented with a spot radiographic image. The catheter aspirates and flushes normally. The catheter was flushed with appropriate volume heparin  dwells. The catheter exit site was secured with a 0-Prolene retention suture. The venotomy incision was closed with an interrupted 4-0 Vicryl, Dermabond and Steri-strips. Dressings were applied. The patient tolerated the procedure well without immediate post procedural complication. FINDINGS: After catheter placement, the tip lies within the superior cavoatrial junction. The catheter aspirates and flushes normally and is ready for immediate use. IMPRESSION: Successful placement of 19 cm tunneled hemodialysis catheter via the right internal jugular vein with tip terminating at the superior caval atrial junction. The catheter is ready for immediate use. Electronically Signed   By: Cordella Banner   On: 09/10/2024 13:41   Procedures   Medications Ordered in the ED - No data to display  Clinical Course as of 09/14/24 1705  Fri Sep 12, 2024  1003 Discussed results with patient. Heparin  ordered and vascular surgery consulted [RR]  1006 Spoke with vascular. No need for admission. Will need DOAC and follow up with DVT Clinic. [RR]  1110 Spoke with Nephrology, Dr. Geralynn, catheter is still ok to  use. Will work on getting her in for outpatient dialysis [RR]    Clinical Course User Index [RR] Bernis Ernst, PA-C                               Medical Decision Making Risk Prescription drug management.   77 y.o. female presents to the ER for evaluation of right arm swelling and right sided facial swelling. Differential diagnosis includes but is not limited to SVC syndrome, mass, DVT, cellulitis. Vital signs elevated blood pressure otherwise unremarkable. Physical exam as noted above.   On previous chart evaluation, patient was seen on 09-09-2024 and had an IR placed tunneled dialysis catheter on 09-10-2024 after she had pulled her catheter out during her sleep the night before.  Ultrasound and labs were ordered for concern for blood clot.  He has minimal facial swelling.  I do appreciate more swelling into the arm.  No crepitus.  Neurovascular intact distally.  Equal grip strength.  Lung sounds are clear.  No stridor.  Airway not compromised.  Patient ports that she has has not coughed for weeks but no shortness of breath.  Reports that she does have seasonal allergies and postnasal drip.  Will obtain viral swab and chest x-ray.  I independently reviewed and interpreted the patient's labs.  CBC shows mild anemia consistent with patient's baseline.  Renal function panel shows BUN of 35, calcium  8.5 and a creatinine of 8.65.  Patient no dialysis patient.  PT mildly elevated 15.3.  Recheck panel negative.  DVT study shows Right: Findings consistent with acute deep vein thrombosis involving the right internal jugular vein  and right subclavian vein. Findings consistent with age indeterminate deep vein thrombosis involving the right axillary vein.  Left: No evidence of thrombosis in the subclavian. Per radiologist's interpretation.    CXR Low volume film without acute cardiopulmonary findings. Per radiologist's interpretation.    Heparin  is ordered given the positive blood culture.  I spoke with  vascular surgery.  They do not see need for admission for the patient.  Will need DOAC and follow-up with DVT clinic.  Will consult pharmacy for need for dosing given dialysis.  Additionally, I consulted nephrology's with Dr. Vassie.  Catheter still okay to use and does not need an additional catheter placed.  I have worked with Randine Mungo to get the patient rescheduled for her dialysis which she will have a 630 arrival time for a 650 chair time tomorrow morning.  Patient is aware of this.  I do not feel like she needs emergent dialysis given she has no chest pain or shortness of breath.  X-ray does not show volume overload.  I spoke with [place], [facility] on DOAC recommendations.  Eliquis is still mainstay for renal patients.  Normal dosing.  I have given her a dose here.  Pharmacy has reviewed Eliquis with her as well as blood thinner information given.  I have sent her home with a starter pack that was brought to bedside from the pharmacy.  We discussed the results of the labs/imaging. The plan is take Eliquis, follow-up with DVT clinic, follow-up dialysis tomorrow. We discussed strict return precautions and red flag symptoms. The patient verbalized their understanding and agrees to the plan. The patient is stable and being discharged home in good condition.  I discussed this case with my attending physician who cosigned this note including patient's presenting symptoms, physical exam, and planned diagnostics and interventions. Attending physician stated agreement with plan or made changes to plan which were implemented.   Attending physician assessed patient at bedside.  Portions of this report may have been transcribed using voice recognition software. Every effort was made to ensure accuracy; however, inadvertent computerized transcription errors may be present.    Final diagnoses:  Thrombosis of right subclavian vein (HCC)  Acute thrombosis of right internal jugular vein (HCC)  Acute thrombosis  of right axillary vein [facility])    ED Discharge Orders          Ordered    AMB Referral to Deep Vein Thrombosis Clinic        09/12/24 1219    APIXABAN (ELIQUIS) VTE STARTER PACK (10MG  AND 5MG )  Status:  Discontinued       Note to Pharmacy: If starter pack unavailable, substitute with seventy-four 5 mg apixaban tabs following the above SIG directions.   09/12/24 1219    APIXABAN (ELIQUIS) VTE STARTER PACK (10MG  AND 5MG )       Note to Pharmacy: If starter pack unavailable, substitute with seventy-four 5 mg apixaban tabs following the above SIG directions.   09/12/24 1226               Bernis Ernst, PA-C 09/14/24 1722    Rogelia Jerilynn RAMAN, MD 09/25/24 [phone]

## 2024-09-12 NOTE — Progress Notes (Signed)
 PHARMACY - ANTICOAGULATION CONSULT NOTE  Pharmacy Consult for IV UFH Indication: upper extremity/jugular DVT  No Known Allergies  Patient Measurements:    Vital Signs: Temp: 97.6 F (36.4 C) (10/17 0738) Temp Source: Oral (10/17 0738) BP: 135/104 (10/17 0738) Pulse Rate: 81 (10/17 0738)  Labs: Recent Labs    09/10/24 1004 09/12/24 0649 09/12/24 0655  HGB 12.2 11.0*  --   HCT 38.5 35.9*  --   PLT 178 159  --   LABPROT  --   --  15.3*  INR  --   --  1.1  CREATININE  --   --  8.65*    Estimated Creatinine Clearance: 5 mL/min (A) (by C-G formula based on SCr of 8.65 mg/dL (H)).   Medical History: Past Medical History:  Diagnosis Date   Anemia    Chronic kidney disease (CKD), stage IV (severe) (HCC)    Diabetes mellitus without complication (HCC)    Hypertension    Renal disorder     Medications:  Scheduled:  Infusions:   Assessment: 78 yo presented with complaints of 2 days of right arm and face swelling. Note patient currently receives HD, HD catheter pulled out and presented to ER on 10/14 and was re-inserted on 10/15 and per notes missed dialysis session on Wed 10/15 as a result. Per US , patient now found to have acute deep DVT involving right internal jugular and right subclavian vein to start IV UFH per pharmacy. Baseline CBC and renal function already done.   Goal of Therapy:  Heparin  level 0.3-0.7 units/ml Monitor platelets by anticoagulation protocol: Yes   Plan:  IV Heparin  2000 unit bolus then IV heparin  rate of 900 units/hr Check heparin  level in 8 hours Daily CBC  Jackie Wade 09/12/2024,10:02 AM

## 2024-09-12 NOTE — ED Triage Notes (Signed)
 Pt arrives with reports of swelling to right side of face, neck and right arm. Pt had dialysis perm cath on right chest replaced on Wednesday here and noticed the swelling yesterday. Pt missed dialysis Wednesday and has missed this morning. Pt also reports recent cough and congestion.

## 2024-09-12 NOTE — Progress Notes (Addendum)
 Contacted by ED provider with request for pt to receive out-pt HD treatment later today vs tomorrow. Contacted [facility] and spoke to charge RN. Clinic can treat pt tomorrow. Pt will need to arrive at 6:30 am for 6:50 am chair time. This info was provided to ED provider to provide to pt. HD appt added to AVS as well.   Randine Mungo Dialysis Navigator [phone]  Addendum at 3:20 pm: Contacted [facility] to be advised that pt was d/c today as planned and should be at appt tomorrow.

## 2024-09-12 NOTE — Progress Notes (Signed)
 Right upper ext venous  has been completed. Refer to [facility] under chart review to view preliminary results. Results given to [place], [place].  09/12/2024  10:01 AM Costas Sena D

## 2024-09-12 NOTE — Discharge Instructions (Addendum)
 You were seen in the ER today for evaluation of your arm and facial swelling. It was discovered that you have a blood clot to the area. You will need to be started on a blood thinner. I have given you your first dose in the ER. You will need to continue taking it tonight. You will take the medication as prescribed. Additionally, you will need to follow up with the DVT Clinic. I have placed their information into the discharge paperwork for you to call to schedule a follow up appointment with. Being that you are on a blood thinner, it is important that you take special precautions.  If you were to get in any accident or trauma or hit your head, you will need to return to the ER for evaluation as this does put you at increased risk for internal bleeding.  I have included information on Eliquis into the discharge paperwork in addition to the information you were given here.  I have also included more information on DVTs for you to review.  Please make sure that you attend your rescheduled dialysis appointment which is tomorrow.  Please arrive tomorrow at 0630 for a 0650 chair time.  If you have any concerns, new or worsening symptoms, please return to the nearest emergency department for reevaluation.  **Dr. Geralynn from nephrology  Contact a health care provider if: You miss a dose of your blood thinner. You have unusual bruising or other color changes. You have new or worse pain, swelling, or redness in an arm or a leg. You have worsening numbness or tingling in an arm or a leg. You have a significant color change (pale or blue) in the extremity that has the DVT. Get help right away if: You have signs or symptoms that a blood clot has moved to the lungs. These may include: Shortness of breath. Chest pain. Fast or irregular heartbeats (palpitations). Light-headedness, dizziness, or fainting. Coughing up blood. You have signs or symptoms that your blood is too thin. These may include: Blood in your  vomit, stool, or urine. A cut that will not stop bleeding. A menstrual period that is heavier than usual. A severe headache or confusion. These symptoms may be an emergency. Get help right away. Call 911. Do not wait to see if the symptoms will go away. Do not drive yourself to the hospital.    Information on my medicine - ELIQUIS (apixaban)   Why was Eliquis prescribed for you? Eliquis was prescribed to treat blood clots that may have been found in the veins of your legs (deep vein thrombosis) or in your lungs (pulmonary embolism) and to reduce the risk of them occurring again.  What do You need to know about Eliquis ? The starting dose is 10 mg (two 5 mg tablets) taken TWICE daily for the FIRST SEVEN (7) DAYS, then on 09/19/24  the dose is reduced to ONE 5 mg tablet taken TWICE daily.  Eliquis may be taken with or without food.   Try to take the dose about the same time in the morning and in the evening. If you have difficulty swallowing the tablet whole please discuss with your pharmacist how to take the medication safely.  Take Eliquis exactly as prescribed and DO NOT stop taking Eliquis without talking to the doctor who prescribed the medication.  Stopping may increase your risk of developing a new blood clot.  Refill your prescription before you run out.  After discharge, you should have regular check-up appointments with  your healthcare provider that is prescribing your Eliquis.    What do you do if you miss a dose? If a dose of ELIQUIS is not taken at the scheduled time, take it as soon as possible on the same day and twice-daily administration should be resumed. The dose should not be doubled to make up for a missed dose.  Important Safety Information A possible side effect of Eliquis is bleeding. You should call your healthcare provider right away if you experience any of the following: Bleeding from an injury or your nose that does not stop. Unusual colored urine  (red or dark brown) or unusual colored stools (red or black). Unusual bruising for unknown reasons. A serious fall or if you hit your head (even if there is no bleeding).  Some medicines may interact with Eliquis and might increase your risk of bleeding or clotting while on Eliquis. To help avoid this, consult your healthcare provider or pharmacist prior to using any new prescription or non-prescription medications, including herbals, vitamins, non-steroidal anti-inflammatory drugs (NSAIDs) and supplements.  This website has more information on Eliquis (apixaban): http://www.eliquis.com/eliquis/home

## 2024-09-18 NOTE — Progress Notes (Addendum)
 DVT Clinic Note  Name: Jackie Wade     MRN: [id number]     DOB: 19-Feb-1946     Sex: female  PCP: Benjamine Aland, MD  Today's Visit: Visit Information: Initial Visit  Referred to DVT Clinic by: Emergency Department - Carlo Cornish, PA-C Referred to CPP by: Dr. Serene Reason for referral:  Chief Complaint  Patient presents with   Med Management - DVT   HISTORY OF PRESENT ILLNESS: Jackie Wade is a 78 y.o. female with PMH ESRD on HD, HTN, T2DM, HF, who presents after diagnosis of DVT for medication management. She presented to the ED 09/12/24 with right arm swelling. Had recently had new tunneled dialysis catheter placed by IR on 09/10/24 and noticed swelling since then. Found to have right upper extremity DVT. She was started on Eliquis and referred to DVT Clinic for follow up. Today, patient reports that the swelling initially extended from her hand to up the length of her arm, neck, and even up to the right side of her face. Since starting Eliquis this has improved. The remaining swelling is primarily in her forearm. Denies abnormal bleeding or bruising. Reports 1 missed dose of Eliquis when she fell asleep before taking the evening dose. Has otherwise been taking it around 9am and 9pm. Has not yet started elevating her arm. She has no prior history of DVT.   Positive Thrombotic Risk Factors: Older Age, Other (comment) (TDC placed 09/10/24) Bleeding Risk Factors: Anticoagulant therapy, Age >65 years, Renal failure  Negative Thrombotic Risk Factors: Previous VTE, Recent admission to hospital with acute illness (within 3 months), Paralysis, paresis, or recent plaster cast immobilization of lower extremity, Bed rest >72 hours within 3 months, Sedentary journey lasting >8 hours within 4 weeks, Pregnancy, Within 6 weeks postpartum, Recent cesarean section (within 3 months), Estrogen therapy, Testosterone therapy, Erythropoiesis-stimulating agent, Recent COVID diagnosis (within 3 months),  Active cancer, Non-malignant, chronic inflammatory condition, Known thrombophilic condition, Smoking, Obesity  Rx Insurance Coverage: Medicare Rx Affordability: Used one time free card to fill starter pack in the ED. Refills are $47/month. Rx Assistance Provided: Free 30-day trial card Preferred Pharmacy: Refills [facility] to [facility]'s preferred [facility].  Past Medical History:  Diagnosis Date   Anemia    Chronic kidney disease (CKD), stage IV (severe) (HCC)    Diabetes mellitus without complication (HCC)    Hypertension    Renal disorder     Past Surgical History:  Procedure Laterality Date   BASCILIC VEIN TRANSPOSITION Left 07/04/2021   Procedure: LEFT ARM BASCILIC VEIN TRANSPOSITION 1ST STAGE;  Surgeon: Harvey Carlin BRAVO, MD;  Location: [facility] OR;  Service: Vascular;  Laterality: Left;   BASCILIC VEIN TRANSPOSITION Left 12/22/2021   Procedure: LEFT ARM SECOND STAGE BASILIC VEIN TRANSPOSITION;  Surgeon: Magda Debby SAILOR, MD;  Location: [facility] OR;  Service: Vascular;  Laterality: Left;   I & D EXTREMITY Left 12/22/2021   Procedure: IRRIGATION AND DEBRIDEMENT HEMATOMA;  Surgeon: Magda Debby SAILOR, MD;  Location: [facility] OR;  Service: Vascular;  Laterality: Left;   INSERTION OF DIALYSIS CATHETER Right 12/22/2021   Procedure: INSERTION OF 14.5 FR/CH X 19 CM  DIALYSIS CATHETER;  Surgeon: Magda Debby SAILOR, MD;  Location: [facility] OR;  Service: Vascular;  Laterality: Right;   IR TUNNELED CENTRAL VENOUS CATH [facility] W IMG  09/10/2024   WRIST SURGERY      Social History   Socioeconomic History   Marital status: Married    Spouse name: Not on file   Number of  children: Not on file   Years of education: Not on file   Highest education level: Not on file  Occupational History   Not on file  Tobacco Use   Smoking status: Former    Types: Cigarettes   Smokeless tobacco: Former  Advertising Account Planner   Vaping status: Never Used  Substance and Sexual Activity   Alcohol use: Yes    Comment: Occasional Artist   Drug use: No    Sexual activity: Not on file  Other Topics Concern   Not on file  Social History Narrative   Right Handed    Lives in a two story home    Social Drivers of Health   Financial Resource Strain: Not on file  Food Insecurity: Not on file  Transportation Needs: Not on file  Physical Activity: Not on file  Stress: Not on file  Social Connections: Not on file  Intimate Partner Violence: Not on file    Family History  Problem Relation Age of Onset   Alzheimer's disease Mother    Premature CHD Neg Hx     Allergies as of 09/22/2024   (No Known Allergies)    Current Outpatient Medications on File Prior to Visit  Medication Sig Dispense Refill   acetaminophen  (TYLENOL ) 325 MG tablet Take 2 tablets (650 mg total) by mouth every 4 (four) hours as needed for headache or mild pain.     APIXABAN (ELIQUIS) VTE STARTER PACK (10MG  AND 5MG ) Take as directed on package: start with two-5mg  tablets twice daily for 7 days. On day 8, switch to one-5mg  tablet twice daily. 74 each 0   carvedilol  (COREG ) 6.25 MG tablet Take 1 tablet (6.25 mg total) by mouth 2 (two) times daily with a meal. 60 tablet 0   JANUVIA 100 MG tablet Take 100 mg by mouth daily.     rosuvastatin  (CRESTOR ) 5 MG tablet Take 1 tablet (5 mg total) by mouth daily. 30 tablet 0   sevelamer carbonate (RENVELA) 800 MG tablet Take by mouth.     [DISCONTINUED] insulin  glargine (LANTUS ) 100 UNIT/ML Solostar Pen Inject 3 Units into the skin daily.     No current facility-administered medications on file prior to visit.   REVIEW OF SYSTEMS:  Review of Systems  Respiratory:  Negative for shortness of breath.   Cardiovascular:  Negative for chest pain and palpitations.  Neurological:  Negative for dizziness.   PHYSICAL EXAMINATION:  Physical Exam Musculoskeletal:        General: Swelling (right forearm) present.  Skin:    Findings: No bruising or erythema.  Psychiatric:        Mood and Affect: Mood normal.        Behavior: Behavior  normal.        Thought Content: Thought content normal.   LABS:  CBC     Component Value Date/Time   WBC 5.3 09/12/2024 0649   RBC 3.93 09/12/2024 0649   HGB 11.0 (L) 09/12/2024 0649   HCT 35.9 (L) 09/12/2024 0649   PLT 159 09/12/2024 0649   MCV 91.3 09/12/2024 0649   MCH 28.0 09/12/2024 0649   MCHC 30.6 09/12/2024 0649   RDW 15.8 (H) 09/12/2024 0649   LYMPHSABS 1.0 09/12/2024 0649   MONOABS 0.3 09/12/2024 0649   EOSABS 0.2 09/12/2024 0649   BASOSABS 0.0 09/12/2024 0649    Hepatic Function      Component Value Date/Time   PROT 7.1 02/23/2023 0843   ALBUMIN  3.7 09/12/2024 0655   AST  21 02/23/2023 0843   ALT 11 02/23/2023 0843   ALKPHOS 57 02/23/2023 0843   BILITOT 0.7 02/23/2023 0843    Renal Function   Lab Results  Component Value Date   CREATININE 8.65 (H) 09/12/2024   CREATININE 5.78 (H) 02/23/2023   CREATININE 5.52 (H) 12/24/2021    Estimated Creatinine Clearance: 5 mL/min (A) (by C-G formula based on SCr of 8.65 mg/dL (H)).   [facility] Vascular Lab Studies:  09/12/24 VAS US  UPPER EXTREMITY VENOUS DUPLEX RIGHT Right:  Findings consistent with acute deep vein thrombosis involving the right  internal jugular vein and right subclavian vein. Findings consistent with age  indeterminate deep vein thrombosis involving the right axillary vein.    Left:  No evidence of thrombosis in the subclavian.   ASSESSMENT: Location of DVT: Right upper extremity Cause of DVT: provoked by a transient risk factor  Patient without prior history of DVT diagnosed with acute DVT in the right internal jugular vein and right subclavian vein with age indeterminate DVT in the right axillary vein. DVT provoked by recent tunneled dialysis catheter placement. She was appropriately started on Eliquis in the ED and has been tolerating this well. Her symptoms have started to significantly improve from baseline but swelling has not completely resolved in her forearm. Discussed with Dr. Serene in clinic  today. Would not pursue intervention since her symptoms are improving, and the patient wishes to avoid. Will continue anticoagulation for a total of 3 months. No need for repeat imaging. Patient is agreeable with this plan. Provided refills to complete planned treatment. Counseled patient on Eliquis, and all questions have been answered.   PLAN: -Continue apixaban (Eliquis) 5 mg twice daily. -Expected duration of therapy: 3 months. Therapy started on 09/12/24. -Patient educated on purpose, proper use and potential adverse effects of apixaban (Eliquis). -Discussed importance of taking medication around the same time every day. -Advised patient of medications to avoid (NSAIDs, aspirin doses >100 mg daily). -Educated that Tylenol  (acetaminophen ) is the preferred analgesic to lower the risk of bleeding. -Advised patient to alert all providers of anticoagulation therapy prior to starting a new medication or having a procedure. -Emphasized importance of monitoring for signs and symptoms of bleeding (abnormal bruising, prolonged bleeding, nose bleeds, bleeding from gums, discolored urine, black tarry stools). -Educated patient to present to the ED if emergent signs and symptoms of new thrombosis occur. -Elevate arm to help improve swelling.   Follow up: with PCP per patient preference. DVT Clinic as needed.  Lum Herald, PharmD, [place], CPP [facility] [phone]  I have evaluated the patient's chart/imaging and refer this patient to the Clinical Pharmacist Practitioner for medication management. I have reviewed the CPP's documentation and agree with her assessment and plan. I was immediately available during the visit for questions and collaboration.   Malvina Serene, MD

## 2024-09-19 ENCOUNTER — Telehealth: Payer: Self-pay | Admitting: Pharmacist

## 2024-09-19 NOTE — Telephone Encounter (Signed)
 Received a call from patient with a question about her DVT clinic appointment scheduled for Monday. She stated that a pharmacist had already talked to her about Eliquis in the ED so she was unsure what this appointment was for. Explained the purpose and benefits of her appointment with the pharmacist in DVT clinic. She expressed understanding and stated she was glad that someone would be following up with her regarding the blood clot.

## 2024-09-22 ENCOUNTER — Encounter: Payer: Self-pay | Admitting: Student-PharmD

## 2024-09-22 ENCOUNTER — Ambulatory Visit: Attending: Surgery | Admitting: Student-PharmD

## 2024-09-22 DIAGNOSIS — I82621 Acute embolism and thrombosis of deep veins of right upper extremity: Secondary | ICD-10-CM

## 2024-09-22 MED ORDER — APIXABAN 5 MG PO TABS: 5.0000 mg | ORAL_TABLET | Freq: Two times a day (BID) | ORAL | 1 refills | Status: AC

## 2024-09-22 NOTE — Patient Instructions (Signed)
-  Continue apixaban (Eliquis) 5 mg twice daily. Take this for a total of 3 months.  -Your [facility]ills [facility] your [facility]. You may need to call the pharmacy to ask them to fill this when you start to run low on your current supply.  -It is important to take your medication around the same time every day.  -Avoid NSAIDs like ibuprofen (Advil, Motrin) and naproxen (Aleve) as well as aspirin doses over 100 mg daily. -Tylenol  (acetaminophen ) is the preferred over the counter pain medication to lower the risk of bleeding. -Be sure to alert all of your health care providers that you are taking an anticoagulant prior to starting a new medication or having a procedure. -Monitor for signs and symptoms of bleeding (abnormal bruising, prolonged bleeding, nose bleeds, bleeding from gums, discolored urine, black tarry stools). If you have fallen and hit your head OR if your bleeding is severe or not stopping, seek emergency care.  -Go to the emergency room if emergent signs and symptoms of new clot occur (new or worse swelling and pain in an arm or leg, shortness of breath, chest pain, fast or irregular heartbeats, lightheadedness, dizziness, fainting, coughing up blood) or if you experience a significant color change (pale or blue) in the extremity that has the DVT.   If you have any questions or need to reschedule an appointment, please call [phone]. If you are having an emergency, call 911 or present to the nearest emergency room.   What is a DVT?  -Deep vein thrombosis (DVT) is a condition in which a blood clot forms in a vein of the deep venous system which can occur in the lower leg, thigh, pelvis, arm, or neck. This condition is serious and can be life-threatening if the clot travels to the arteries of the lungs and causing a blockage (pulmonary embolism, PE). A DVT can also damage veins in the leg, which can lead to long-term venous disease, leg pain, swelling, discoloration, and ulcers or sores  (post-thrombotic syndrome).  -Treatment may include taking an anticoagulant medication to prevent more clots from forming and the current clot from growing, wearing compression stockings, and/or surgical procedures to remove or dissolve the clot.

## 2024-12-17 ENCOUNTER — Encounter (HOSPITAL_BASED_OUTPATIENT_CLINIC_OR_DEPARTMENT_OTHER): Payer: Self-pay | Admitting: Cardiology

## 2024-12-17 ENCOUNTER — Ambulatory Visit (HOSPITAL_BASED_OUTPATIENT_CLINIC_OR_DEPARTMENT_OTHER): Admitting: Cardiology

## 2024-12-17 VITALS — BP 148/88 | HR 90 | Ht 63.0 in | Wt 133.0 lb

## 2024-12-17 DIAGNOSIS — N186 End stage renal disease: Secondary | ICD-10-CM

## 2024-12-17 DIAGNOSIS — E785 Hyperlipidemia, unspecified: Secondary | ICD-10-CM

## 2024-12-17 DIAGNOSIS — I10 Essential (primary) hypertension: Secondary | ICD-10-CM | POA: Diagnosis not present

## 2024-12-17 DIAGNOSIS — I700 Atherosclerosis of aorta: Secondary | ICD-10-CM

## 2024-12-17 DIAGNOSIS — E1169 Type 2 diabetes mellitus with other specified complication: Secondary | ICD-10-CM

## 2024-12-17 DIAGNOSIS — Z86718 Personal history of other venous thrombosis and embolism: Secondary | ICD-10-CM | POA: Diagnosis not present

## 2024-12-17 DIAGNOSIS — Z992 Dependence on renal dialysis: Secondary | ICD-10-CM

## 2024-12-17 MED ORDER — ROSUVASTATIN CALCIUM 5 MG PO TABS: 5.0000 mg | ORAL_TABLET | Freq: Every day | ORAL | 3 refills | Status: AC

## 2024-12-17 NOTE — Progress Notes (Signed)
 " Cardiology Office Note:  .   Date:  12/17/2024  ID:  Jackie Wade, DOB [birth year], MRN [id number] PCP: Sebastian Jerilyn HERO, FNP  [facility] Providers Cardiologist:  Shelda Bruckner, MD {  History of Present Illness: Jackie   Jackie Wade is a 79 y.o. female with PMH ESRD on HD, type II diabetes, hypertension, mixed hyperlipidemia. She is seen as a new patient evaluation for chest pressure. Of note, I have seen her remotely, last in 2021.  Referral from 10/29/24 from Dr. Dolan reviewed. Reviewed notes, but no history included.  Today: Patient is frustrated today, went to [facility] office first then had to come to [facility]. She also denies chest pressure (which is listed on the referral form), declined ECG today.   She is being evaluated for renal transplant, has been followed at both [facility] and [facility] for this. I reviewed her workup in Care Everywhere, had echo and nuclear stress test in 09/2023. No evidence of ischemia on stress test. Echo with EF 65%, normal RV, no significant valve disease.  She is short of breath on exertion when her dry weight is not correct, but has not felt that in some time.   Working on healthy eating changes. She can walk, works every day, but feels very fatigued if she pushes herself too hard. Has baseline fatigue chronically.   We reviewed mixed guidance on statin in ESRD  ROS: Denies chest pain, shortness of breath at rest or with normal exertion. No PND, orthopnea, LE edema or unexpected weight gain. No syncope or palpitations. ROS otherwise negative except as noted.   Studies Reviewed: Jackie    EKG:     declined  Physical Exam:   VS:  BP (!) 148/88 (Cuff Size: Normal)   Pulse 90   Ht 5' 3 (1.6 m)   Wt 133 lb (60.3 kg)   SpO2 98%   BMI 23.56 kg/m    Wt Readings from Last 3 Encounters:  12/17/24 133 lb (60.3 kg)  09/10/24 139 lb 12.5 oz (63.4 kg)  09/09/24 140 lb (63.5 kg)    GEN: Well nourished, well developed in no acute  distress HEENT: Normal, moist mucous membranes NECK: No JVD CARDIAC: regular rhythm, normal S1 and S2, no rubs or gallops. No murmur. VASCULAR: Radial and DP pulses 2+ bilaterally. No carotid bruits RESPIRATORY:  Clear to auscultation without rales, wheezing or rhonchi  ABDOMEN: Soft, non-tender, non-distended MUSCULOSKELETAL:  Ambulates independently SKIN: Warm and dry, no edema NEUROLOGIC:  Alert and oriented x 3. No focal neuro deficits noted. PSYCHIATRIC:  Normal affect    ASSESSMENT AND PLAN: .    Hypertension: -managed by nephrology given ESRD on HD -on amlodipine  5 mg daily, carvedilol  6.25 mg BID   Type II diabetes, without obesity Mixed hyperlipidemia Aortic atherosclerosis ESRD on HD -being evaluated for kidney transplant, has been on dialysis for 3 years -tolerating rosuvastatin  5 mg daily. Minimal atherosclerosis on her CT abd/pelvis. No ischemia on prior stress tests. Did discuss data for/again statin in ESRD, but with atherosclerosis (although minimal) on CT, I favor continuing statin. She also wishes to be aggressive.  -discussed pros/cons of aspirin given diabetes, atherosclerosis. Would not take while she is on apixaban , see below. If apixaban  is stopped in the future, she could consider aspirin, but low threshold to stop if there are any bleeding issues.  Catheter-associated DVT 08/2024 -was seen in the DVT clinic 09/22/24, recommended 3 mos of apixaban  -still has catheter in for access. Recommend  she discuss with Dr. Dolan if she needs to remain on anticoagulation while having catheter in place -no major bleeding issues  Dispo: 1 year or sooner as needed  Signed, Shelda Bruckner, MD   Shelda Bruckner, MD, PhD, [facility] [place]  [facility]  [place]  [facility] at [facility] at [facility] [address], [address] [place], [place] [address] [phone]   "

## 2024-12-17 NOTE — Patient Instructions (Signed)
 Medication Instructions:  No changes *If you need a refill on your cardiac medications before your next appointment, please call your pharmacy*  Lab Work: none If you have labs (blood work) drawn today and your tests are completely normal, you will receive your results only by: MyChart Message (if you have MyChart) OR A paper copy in the mail If you have any lab test that is abnormal or we need to change your treatment, we will call you to review the results.  Testing/Procedures: none  Follow-Up: At [facility], you and your health needs are our priority.  As part of our continuing mission to provide you with exceptional heart care, our providers are all part of one team.  This team includes your primary Cardiologist (physician) and Advanced Practice Providers or APPs (Physician Assistants and Nurse Practitioners) who all work together to provide you with the care you need, when you need it.  Your next appointment:   12 month(s)  Provider:   Shelda Bruckner, MD, Rosaline Bane, NP, or Reche Finder, NP
